# Patient Record
Sex: Female | Born: 1935 | Race: White | Hispanic: No | Marital: Married | Smoking: Current every day smoker
Health system: Southern US, Community
[De-identification: ages and names within clinical notes are randomized; demographics above are authoritative.]

## PROBLEM LIST (undated history)

## (undated) DIAGNOSIS — N289 Disorder of kidney and ureter, unspecified: Secondary | ICD-10-CM

## (undated) DIAGNOSIS — R238 Other skin changes: Secondary | ICD-10-CM

## (undated) DIAGNOSIS — J449 Chronic obstructive pulmonary disease, unspecified: Secondary | ICD-10-CM

## (undated) DIAGNOSIS — I7090 Unspecified atherosclerosis: Secondary | ICD-10-CM

## (undated) DIAGNOSIS — C3490 Malignant neoplasm of unspecified part of unspecified bronchus or lung: Secondary | ICD-10-CM

## (undated) DIAGNOSIS — S0990XA Unspecified injury of head, initial encounter: Secondary | ICD-10-CM

## (undated) DIAGNOSIS — R52 Pain, unspecified: Secondary | ICD-10-CM

## (undated) DIAGNOSIS — Z923 Personal history of irradiation: Secondary | ICD-10-CM

## (undated) DIAGNOSIS — R233 Spontaneous ecchymoses: Secondary | ICD-10-CM

## (undated) DIAGNOSIS — I10 Essential (primary) hypertension: Secondary | ICD-10-CM

## (undated) DIAGNOSIS — R0602 Shortness of breath: Secondary | ICD-10-CM

## (undated) DIAGNOSIS — M1990 Unspecified osteoarthritis, unspecified site: Secondary | ICD-10-CM

## (undated) HISTORY — PX: OTHER SURGICAL HISTORY: SHX169

## (undated) HISTORY — PX: KIDNEY STONE SURGERY: SHX686

## (undated) HISTORY — DX: Essential (primary) hypertension: I10

## (undated) HISTORY — DX: Personal history of irradiation: Z92.3

## (undated) HISTORY — PX: LUNG BIOPSY: SHX232

## (undated) HISTORY — DX: Chronic obstructive pulmonary disease, unspecified: J44.9

---

## 2003-02-03 ENCOUNTER — Encounter: Admission: RE | Admit: 2003-02-03 | Discharge: 2003-02-03 | Payer: Self-pay | Admitting: Internal Medicine

## 2003-02-03 ENCOUNTER — Encounter: Payer: Self-pay | Admitting: Internal Medicine

## 2003-02-09 ENCOUNTER — Encounter: Admission: RE | Admit: 2003-02-09 | Discharge: 2003-02-09 | Payer: Self-pay | Admitting: Internal Medicine

## 2003-02-09 ENCOUNTER — Encounter: Payer: Self-pay | Admitting: Internal Medicine

## 2007-09-29 ENCOUNTER — Inpatient Hospital Stay (HOSPITAL_COMMUNITY): Admission: EM | Admit: 2007-09-29 | Discharge: 2007-10-02 | Payer: Self-pay | Admitting: Emergency Medicine

## 2007-10-24 ENCOUNTER — Ambulatory Visit: Payer: Self-pay | Admitting: Cardiovascular Disease

## 2007-10-24 ENCOUNTER — Inpatient Hospital Stay (HOSPITAL_COMMUNITY): Admission: EM | Admit: 2007-10-24 | Discharge: 2007-10-27 | Payer: Self-pay | Admitting: Emergency Medicine

## 2007-10-30 ENCOUNTER — Ambulatory Visit (HOSPITAL_COMMUNITY): Admission: RE | Admit: 2007-10-30 | Discharge: 2007-10-30 | Payer: Self-pay | Admitting: *Deleted

## 2007-12-18 ENCOUNTER — Encounter (HOSPITAL_COMMUNITY): Admission: RE | Admit: 2007-12-18 | Discharge: 2008-02-04 | Payer: Self-pay | Admitting: Cardiology

## 2009-05-12 ENCOUNTER — Other Ambulatory Visit: Admission: RE | Admit: 2009-05-12 | Discharge: 2009-05-12 | Payer: Self-pay | Admitting: Internal Medicine

## 2009-08-23 ENCOUNTER — Encounter: Admission: RE | Admit: 2009-08-23 | Discharge: 2009-08-23 | Payer: Self-pay | Admitting: Internal Medicine

## 2010-01-13 ENCOUNTER — Encounter: Admission: RE | Admit: 2010-01-13 | Discharge: 2010-01-13 | Payer: Self-pay | Admitting: Internal Medicine

## 2010-06-24 ENCOUNTER — Encounter: Payer: Self-pay | Admitting: Internal Medicine

## 2010-06-25 ENCOUNTER — Encounter: Payer: Self-pay | Admitting: Internal Medicine

## 2010-06-26 ENCOUNTER — Encounter
Admission: RE | Admit: 2010-06-26 | Discharge: 2010-06-26 | Payer: Self-pay | Source: Home / Self Care | Attending: Internal Medicine | Admitting: Internal Medicine

## 2010-10-17 NOTE — Op Note (Signed)
[name], [name] NO.:  [id number]   MEDICAL RECORD NO.:  [id number]          PATIENT TYPE:  AMB   LOCATION:  ENDO                         FACILITY:  [facility]   PHYSICIAN:  Georgiana Spinner, M.D.    DATE OF BIRTH:  06-12-1934   DATE OF PROCEDURE:  DATE OF DISCHARGE:                               OPERATIVE REPORT   PROCEDURE:  Colonoscopy.   INDICATIONS:  Colon cancer screening, family history of colon cancer.   ANESTHESIA:  Fentanyl 75 mcg, Versed 7.5 mg.   DESCRIPTION OF PROCEDURE:  With the patient mildly sedated in the left  lateral decubitus position, the pediatric colonoscope was inserted in  the rectum and passed under direct vision to the cecum, identified by  ileocecal valve and appendiceal orifice.  From this point, the  colonoscope was slowly withdrawn taking circumferential views of colonic  mucosa, stopping only in the rectum which appeared normal on direct,  showed hemorrhoids on retroflexed view.  The endoscope was straightened  and withdrawn.  The patient's vital signs and pulse oximeter remained  stable.  The patient tolerated the procedure well without apparent  complication.   FINDINGS:  Internal hemorrhoids, otherwise an unremarkable exam.   PLAN:  Consider repeat examination in 5 years.           ______________________________  Georgiana Spinner, M.D.     GMO/MEDQ  D:  10/30/2007  T:  10/30/2007  Job:  [id number]
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
## 2010-10-18 ENCOUNTER — Other Ambulatory Visit: Payer: Self-pay | Admitting: Internal Medicine

## 2010-10-18 DIAGNOSIS — Z1231 Encounter for screening mammogram for malignant neoplasm of breast: Secondary | ICD-10-CM

## 2010-10-20 NOTE — Discharge Summary (Signed)
[name], [name] NO.:  [id number]   MEDICAL RECORD NO.:  [id number]          PATIENT TYPE:  INP   LOCATION:  3039                         FACILITY:  [facility]   PHYSICIAN:  Lonia Blood, M.D.       DATE OF BIRTH:  06/26/1934   DATE OF ADMISSION:  09/29/2007  DATE OF DISCHARGE:  10/02/2007                               DISCHARGE SUMMARY   PRIMARY CARE PHYSICIAN:  Will become Massie Maroon, M.D. with [facility].   DISCHARGE DIAGNOSES:  1. Chronic obstructive pulmonary disease, exacerbation.  2. Status post hysterectomy.  3. Status post surgical removal of a kidney stone.  4. Status post bilateral cataract surgery.   DISCHARGE MEDICATIONS:  1. Avelox 400 mg daily.  2. Prednisone tapered.  3. Ventolin 1 puff three times a day.  4. Spiriva 1 puff daily.   CONDITION ON DISCHARGE:  [name] is discharged in good condition.  She is to follow up with Dr. Pearson Grippe from [facility] as the patient's new primary care physician.   PROCEDURE:  During this admission on September 29, 2007, the patient  underwent a portable chest x-ray PA and lateral, findings of COPD and  left mid lung pneumonia.   CONSULTATION:  No consultations.   HISTORY AND PHYSICAL:  Refer to dictated H&P which was done by Dr. Darnelle Catalan  on September 29, 2007.   HOSPITAL COURSE:  Problem #1.  COPD, exacerbation with probable  pneumonia.  [name] was admitted to [facility] [address]  She was  placed on intravenous antibiotics, intravenous steroids, and  bronchodilators.  The patient had a course of progressing recovery and  she was switched to oral steroids and oral antibiotics without  exacerbation of her symptoms.  The patient was tested and she did not  require home oxygen therapy.  [name] was instructed thoroughly about  the importance of complete tobacco cessation and a careful followup with  her new primary care physician.     Lonia Blood, M.D.  Electronically Signed    SL/MEDQ  D:  10/07/2007  T:  10/08/2007  Job:  [id number]   cc:   Massie Maroon, MD

## 2010-10-24 ENCOUNTER — Ambulatory Visit: Payer: Self-pay

## 2011-02-27 LAB — BLOOD GAS, ARTERIAL
Acid-Base Excess: 3.3 — ABNORMAL HIGH
Bicarbonate: 27.4 — ABNORMAL HIGH
Drawn by: 245131
FIO2: 0.21
O2 Saturation: 87.3
Patient temperature: 98.6
pO2, Arterial: 52.3 — ABNORMAL LOW

## 2011-02-27 LAB — DIFFERENTIAL
Basophils Absolute: 0
Eosinophils Absolute: 0.1
Eosinophils Relative: 2
Lymphocytes Relative: 15
Monocytes Absolute: 1

## 2011-02-27 LAB — COMPREHENSIVE METABOLIC PANEL
ALT: 13
AST: 16
Albumin: 3.1 — ABNORMAL LOW
CO2: 28
Chloride: 100
Creatinine, Ser: 0.62
GFR calc Af Amer: >60
GFR calc non Af Amer: >60
Potassium: 3.6
Sodium: 138
Total Bilirubin: 0.4

## 2011-02-27 LAB — CBC
Platelets: 338
RBC: 3.83 — ABNORMAL LOW
WBC: 5.2

## 2011-02-28 LAB — CARDIAC PANEL(CRET KIN+CKTOT+MB+TROPI)
CK, MB: 1.7
Relative Index: INVALID
Troponin I: 0.04
Troponin I: 0.04

## 2011-02-28 LAB — DIFFERENTIAL
Basophils Absolute: 0
Basophils Relative: 0
Eosinophils Absolute: 0
Eosinophils Relative: 0
Monocytes Absolute: 0.5
Monocytes Relative: 5

## 2011-02-28 LAB — CBC
Hemoglobin: 11.5 — ABNORMAL LOW
MCHC: 33.6
MCV: 88.9
RBC: 3.85 — ABNORMAL LOW
RDW: 14.5

## 2011-02-28 LAB — BASIC METABOLIC PANEL
CO2: 27
Calcium: 8.7
GFR calc Af Amer: >60
GFR calc non Af Amer: >60
Sodium: 136

## 2011-02-28 LAB — POCT I-STAT, CHEM 8
BUN: 11
Chloride: 96
Creatinine, Ser: 0.8
Hemoglobin: 11.9 — ABNORMAL LOW
Potassium: 4
Sodium: 129 — ABNORMAL LOW

## 2011-02-28 LAB — LIPID PANEL
Cholesterol: 125
HDL: 23 — ABNORMAL LOW
LDL Cholesterol: 91

## 2011-02-28 LAB — POCT CARDIAC MARKERS
CKMB, poc: 2.3
Myoglobin, poc: 87
Troponin i, poc: <0.05

## 2011-02-28 LAB — D-DIMER, QUANTITATIVE: D-Dimer, Quant: 0.59 — ABNORMAL HIGH

## 2012-07-08 ENCOUNTER — Other Ambulatory Visit: Payer: Self-pay | Admitting: Internal Medicine

## 2012-07-08 DIAGNOSIS — Z1231 Encounter for screening mammogram for malignant neoplasm of breast: Secondary | ICD-10-CM

## 2012-08-12 ENCOUNTER — Ambulatory Visit
Admission: RE | Admit: 2012-08-12 | Discharge: 2012-08-12 | Disposition: A | Discharge status: Home / Self Care | Payer: Medicare Other | Source: Ambulatory Visit | Attending: Internal Medicine | Admitting: Internal Medicine

## 2012-08-12 DIAGNOSIS — Z1231 Encounter for screening mammogram for malignant neoplasm of breast: Secondary | ICD-10-CM

## 2012-08-14 ENCOUNTER — Other Ambulatory Visit: Payer: Self-pay | Admitting: Internal Medicine

## 2012-08-25 ENCOUNTER — Ambulatory Visit
Admission: RE | Admit: 2012-08-25 | Discharge: 2012-08-25 | Disposition: A | Discharge status: Home / Self Care | Payer: Medicare Other | Source: Ambulatory Visit | Attending: Internal Medicine | Admitting: Internal Medicine

## 2012-08-25 DIAGNOSIS — R928 Other abnormal and inconclusive findings on diagnostic imaging of breast: Secondary | ICD-10-CM

## 2012-12-02 ENCOUNTER — Other Ambulatory Visit: Payer: Self-pay | Admitting: Internal Medicine

## 2012-12-02 DIAGNOSIS — R918 Other nonspecific abnormal finding of lung field: Secondary | ICD-10-CM

## 2012-12-03 ENCOUNTER — Ambulatory Visit
Admission: RE | Admit: 2012-12-03 | Discharge: 2012-12-03 | Disposition: A | Discharge status: Home / Self Care | Payer: Medicare Other | Source: Ambulatory Visit | Attending: Internal Medicine | Admitting: Internal Medicine

## 2012-12-03 DIAGNOSIS — R918 Other nonspecific abnormal finding of lung field: Secondary | ICD-10-CM

## 2012-12-03 MED ORDER — IOHEXOL 300 MG/ML  SOLN
50.0000 mL | Freq: Once | INTRAMUSCULAR | Status: AC | PRN
  Administered 2012-12-03: 50 mL via INTRAVENOUS

## 2012-12-08 ENCOUNTER — Telehealth: Payer: Self-pay | Admitting: Internal Medicine

## 2012-12-08 ENCOUNTER — Telehealth: Payer: Self-pay | Admitting: *Deleted

## 2012-12-08 NOTE — Telephone Encounter (Signed)
Spoke with pt regarding appt with Dr. Arbutus Ped 12/09/12 at 4:00 and labs at 3:30.  She verbalized understanding of time and place of appt

## 2012-12-08 NOTE — Telephone Encounter (Signed)
PT SCHEDULED TO SEE DR. MOHAMED 07/08 @ 3:30.

## 2012-12-09 ENCOUNTER — Other Ambulatory Visit: Payer: Medicare Other | Admitting: Lab

## 2012-12-09 ENCOUNTER — Ambulatory Visit: Payer: Medicare Other | Admitting: Internal Medicine

## 2012-12-09 ENCOUNTER — Ambulatory Visit: Payer: Medicare Other

## 2012-12-12 ENCOUNTER — Ambulatory Visit (HOSPITAL_BASED_OUTPATIENT_CLINIC_OR_DEPARTMENT_OTHER): Payer: Medicare Other | Admitting: Lab

## 2012-12-12 ENCOUNTER — Telehealth: Payer: Self-pay | Admitting: *Deleted

## 2012-12-12 ENCOUNTER — Ambulatory Visit (HOSPITAL_BASED_OUTPATIENT_CLINIC_OR_DEPARTMENT_OTHER): Payer: Medicare Other | Admitting: Internal Medicine

## 2012-12-12 ENCOUNTER — Other Ambulatory Visit: Payer: Medicare Other | Admitting: Lab

## 2012-12-12 ENCOUNTER — Encounter: Payer: Self-pay | Admitting: *Deleted

## 2012-12-12 ENCOUNTER — Encounter: Payer: Self-pay | Admitting: Internal Medicine

## 2012-12-12 ENCOUNTER — Ambulatory Visit: Payer: Medicare Other

## 2012-12-12 VITALS — BP 139/66 | HR 89 | Temp 97.7°F | Resp 18 | Wt 93.0 lb

## 2012-12-12 DIAGNOSIS — R0602 Shortness of breath: Secondary | ICD-10-CM

## 2012-12-12 DIAGNOSIS — R918 Other nonspecific abnormal finding of lung field: Secondary | ICD-10-CM | POA: Insufficient documentation

## 2012-12-12 DIAGNOSIS — F17200 Nicotine dependence, unspecified, uncomplicated: Secondary | ICD-10-CM

## 2012-12-12 DIAGNOSIS — R222 Localized swelling, mass and lump, trunk: Secondary | ICD-10-CM

## 2012-12-12 DIAGNOSIS — J449 Chronic obstructive pulmonary disease, unspecified: Secondary | ICD-10-CM

## 2012-12-12 DIAGNOSIS — I10 Essential (primary) hypertension: Secondary | ICD-10-CM

## 2012-12-12 DIAGNOSIS — R599 Enlarged lymph nodes, unspecified: Secondary | ICD-10-CM

## 2012-12-12 HISTORY — DX: Chronic obstructive pulmonary disease, unspecified: J44.9

## 2012-12-12 HISTORY — DX: Essential (primary) hypertension: I10

## 2012-12-12 LAB — CBC WITH DIFFERENTIAL/PLATELET
Basophils Absolute: 0 10*3/uL (ref 0.0–0.1)
Eosinophils Absolute: 0.3 10*3/uL (ref 0.0–0.5)
HGB: 13.6 g/dL (ref 11.6–15.9)
LYMPH%: 15.5 % (ref 14.0–49.7)
MCV: 92.7 fL (ref 79.5–101.0)
MONO%: 6.5 % (ref 0.0–14.0)
NEUT#: 7.9 10*3/uL — ABNORMAL HIGH (ref 1.5–6.5)
Platelets: 511 10*3/uL — ABNORMAL HIGH (ref 145–400)

## 2012-12-12 LAB — COMPREHENSIVE METABOLIC PANEL (CC13)
Albumin: 3.6 g/dL (ref 3.5–5.0)
Alkaline Phosphatase: 82 U/L (ref 40–150)
BUN: 6.8 mg/dL — ABNORMAL LOW (ref 7.0–26.0)
Glucose: 101 mg/dl (ref 70–140)
Total Bilirubin: 0.35 mg/dL (ref 0.20–1.20)

## 2012-12-12 NOTE — Patient Instructions (Signed)
You have suspicious lung cancer. I will order several studies for further evaluation of her disease including PET scan as well as CT scan of the head and CT guided core biopsy of the left lower lobe lung mass. Followup visit in 2 weeks.

## 2012-12-12 NOTE — Progress Notes (Signed)
Patient screened by Distress Thermometer.  Patient scored a 2 on thermometer, no referrals made at this time. 
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
## 2012-12-12 NOTE — Telephone Encounter (Signed)
appts made and printed. Pt family member refused her appt for 12/25/12. [facility]v appt d/t for 12/31/12 @ her request. Pt is also aware that cs will call them w/ appts for CT biopsy, HEAD, AND PET.Marland KitchenMarland KitchenTD

## 2012-12-15 ENCOUNTER — Telehealth: Payer: Self-pay | Admitting: *Deleted

## 2012-12-15 NOTE — Telephone Encounter (Signed)
When pt was in the office last week we discussed options for smoking cessation.  I called today to follow up.  She stated she would like to try e-cigarettes.  I encourage here to look for alternative to smoking and reviewed this with her.

## 2012-12-16 ENCOUNTER — Encounter (HOSPITAL_COMMUNITY): Payer: Self-pay | Admitting: Pharmacy Technician

## 2012-12-18 ENCOUNTER — Other Ambulatory Visit: Payer: Self-pay | Admitting: Radiology

## 2012-12-19 ENCOUNTER — Other Ambulatory Visit: Payer: Self-pay | Admitting: Radiology

## 2012-12-19 ENCOUNTER — Other Ambulatory Visit: Payer: Self-pay | Admitting: *Deleted

## 2012-12-19 ENCOUNTER — Encounter (HOSPITAL_COMMUNITY)
Admission: RE | Admit: 2012-12-19 | Discharge: 2012-12-19 | Disposition: A | Discharge status: Home / Self Care | Payer: Medicare Other | Source: Ambulatory Visit | Attending: Internal Medicine | Admitting: Internal Medicine

## 2012-12-19 ENCOUNTER — Ambulatory Visit (HOSPITAL_COMMUNITY)
Admission: RE | Admit: 2012-12-19 | Discharge: 2012-12-19 | Disposition: A | Discharge status: Home / Self Care | Payer: Medicare Other | Source: Ambulatory Visit | Attending: Internal Medicine | Admitting: Internal Medicine

## 2012-12-19 DIAGNOSIS — J438 Other emphysema: Secondary | ICD-10-CM | POA: Insufficient documentation

## 2012-12-19 DIAGNOSIS — N1330 Unspecified hydronephrosis: Secondary | ICD-10-CM | POA: Insufficient documentation

## 2012-12-19 DIAGNOSIS — R918 Other nonspecific abnormal finding of lung field: Secondary | ICD-10-CM

## 2012-12-19 DIAGNOSIS — R222 Localized swelling, mass and lump, trunk: Secondary | ICD-10-CM | POA: Insufficient documentation

## 2012-12-19 MED ORDER — PREDNISONE 50 MG PO TABS: ORAL_TABLET | ORAL | Status: DC

## 2012-12-19 MED ORDER — FLUDEOXYGLUCOSE F - 18 (FDG) INJECTION: 17.4000 | Freq: Once | INTRAVENOUS | Status: DC | PRN

## 2012-12-19 MED ORDER — DIPHENHYDRAMINE HCL 50 MG PO TABS: 50.0000 mg | ORAL_TABLET | ORAL | Status: DC

## 2012-12-19 NOTE — Progress Notes (Signed)
Pt has contrast allergy, per dr Donnald Garre, okay to premedicate pt with CT premed protocol of benadryl and prednisone.  SLJ

## 2012-12-22 ENCOUNTER — Encounter (HOSPITAL_COMMUNITY): Payer: Self-pay

## 2012-12-22 ENCOUNTER — Ambulatory Visit (HOSPITAL_COMMUNITY)
Admission: RE | Admit: 2012-12-22 | Discharge: 2012-12-22 | Disposition: A | Discharge status: Home / Self Care | Payer: Medicare Other | Source: Ambulatory Visit | Attending: Interventional Radiology | Admitting: Interventional Radiology

## 2012-12-22 ENCOUNTER — Ambulatory Visit (HOSPITAL_COMMUNITY)
Admission: RE | Admit: 2012-12-22 | Discharge: 2012-12-22 | Disposition: A | Discharge status: Home / Self Care | Payer: Medicare Other | Source: Ambulatory Visit | Attending: Internal Medicine | Admitting: Internal Medicine

## 2012-12-22 ENCOUNTER — Other Ambulatory Visit (HOSPITAL_COMMUNITY): Payer: Medicare Other

## 2012-12-22 DIAGNOSIS — F17200 Nicotine dependence, unspecified, uncomplicated: Secondary | ICD-10-CM | POA: Insufficient documentation

## 2012-12-22 DIAGNOSIS — R918 Other nonspecific abnormal finding of lung field: Secondary | ICD-10-CM

## 2012-12-22 DIAGNOSIS — J4489 Other specified chronic obstructive pulmonary disease: Secondary | ICD-10-CM | POA: Insufficient documentation

## 2012-12-22 DIAGNOSIS — I10 Essential (primary) hypertension: Secondary | ICD-10-CM | POA: Insufficient documentation

## 2012-12-22 DIAGNOSIS — C3430 Malignant neoplasm of lower lobe, unspecified bronchus or lung: Secondary | ICD-10-CM | POA: Insufficient documentation

## 2012-12-22 DIAGNOSIS — J449 Chronic obstructive pulmonary disease, unspecified: Secondary | ICD-10-CM | POA: Insufficient documentation

## 2012-12-22 LAB — PROTIME-INR: INR: 0.9 (ref 0.00–1.49)

## 2012-12-22 LAB — CBC
HCT: 39.7 % (ref 36.0–46.0)
Hemoglobin: 12.8 g/dL (ref 12.0–15.0)
RDW: 16.1 % — ABNORMAL HIGH (ref 11.5–15.5)
WBC: 7.2 10*3/uL (ref 4.0–10.5)

## 2012-12-22 LAB — APTT: aPTT: 29 seconds (ref 24–37)

## 2012-12-22 MED ORDER — MIDAZOLAM HCL 2 MG/2ML IJ SOLN
INTRAMUSCULAR | Status: AC
  Filled 2012-12-22: qty 2

## 2012-12-22 MED ORDER — SODIUM CHLORIDE 0.9 % IV SOLN: Freq: Once | INTRAVENOUS | Status: DC

## 2012-12-22 MED ORDER — FENTANYL CITRATE 0.05 MG/ML IJ SOLN
INTRAMUSCULAR | Status: AC
  Filled 2012-12-22: qty 2

## 2012-12-22 MED ORDER — FENTANYL CITRATE 0.05 MG/ML IJ SOLN
INTRAMUSCULAR | Status: AC | PRN
  Administered 2012-12-22 (×2): 25 ug via INTRAVENOUS

## 2012-12-22 MED ORDER — MIDAZOLAM HCL 2 MG/2ML IJ SOLN
INTRAMUSCULAR | Status: AC | PRN
  Administered 2012-12-22: 1 mg via INTRAVENOUS

## 2012-12-22 NOTE — Progress Notes (Addendum)
Patient has no new complaints of shortness of breath. Patient's breathing status is baseline per patient. Patient's oxygen saturation is decreased from arrival to Short Stay. Dr. Kirt Boys is notified of patients condition. Patient is stable at discharge.

## 2012-12-22 NOTE — H&P (Signed)
Chief Complaint: "I'm here for a lung biopsy" Referring Physician:Mohamed HPI: [name] is an 78 y.o. female with newly found left lower lung mass. She has had PET scan and is now referred for biopsy. PMHx and meds reviewed. Pt feels well, no recent fevers or illness.  Past Medical History:  Past Medical History  Diagnosis Date  . COPD (chronic obstructive pulmonary disease) 12/12/2012  . HTN (hypertension) 12/12/2012    Past Surgical History: History reviewed. No pertinent past surgical history.  Family History: History reviewed. No pertinent family history.  Social History:  reports that she has been smoking Cigarettes.  She has a 15 pack-year smoking history. She does not have any smokeless tobacco history on file. She reports that she does not use illicit drugs. Her alcohol history is not on file.  Allergies:  Allergies  Allergen Reactions  . Iohexol Rash    Broke out in a rash after an iv med at [facility] center.    Medications:   Medication List    ASK your doctor about these medications       diltiazem 120 MG 24 hr capsule  Commonly known as:  TIAZAC  Take 120 mg by mouth every morning.     diphenhydrAMINE 50 MG tablet  Commonly known as:  BENADRYL  Take 1 tablet (50 mg total) by mouth as directed. Take 50mg  1 hour prior to CT scan     OVER THE COUNTER MEDICATION  Take 1 capsule by mouth daily. Calcium carbonate 333 mg, magnesium oxide 133 mg and zinc sulfate 5 mg     predniSONE 50 MG tablet  Commonly known as:  DELTASONE  Take 50mg  13 hours, 7 hours, and 1 hour before CT Scan        Please HPI for pertinent positives, otherwise complete 10 system ROS negative.  Physical Exam: BP 138/57  Pulse 72  Temp(Src) 97.5 F (36.4 C) (Oral)  Resp 18  SpO2 95% There is no height or weight on file to calculate BMI.   General Appearance:  Alert, cooperative, no distress, appears stated age  Head:  Normocephalic, without obvious abnormality, atraumatic  ENT:  Unremarkable  Neck: Supple, symmetrical, trachea midline  Lungs:   Clear to auscultation bilaterally, no w/r/r.  Chest Wall:  No tenderness or deformity  Heart:  Regular rate and rhythm, S1, S2 normal, no murmur, rub or gallop.  Neurologic: Normal affect, no gross deficits.   Results for orders placed during the hospital encounter of 12/22/12 (from the past 48 hour(s))  APTT     Status: None   Collection Time    12/22/12  7:45 AM      Result Value Range   aPTT 29  24 - 37 seconds  CBC     Status: Abnormal   Collection Time    12/22/12  7:45 AM      Result Value Range   WBC 7.2  4.0 - 10.5 K/uL   RBC 4.24  3.87 - 5.11 MIL/uL   Hemoglobin 12.8  12.0 - 15.0 g/dL   HCT 45.4  09.8 - 11.9 %   MCV 93.6  78.0 - 100.0 fL   MCH 30.2  26.0 - 34.0 pg   MCHC 32.2  30.0 - 36.0 g/dL   RDW 14.7 (*) 82.9 - 56.2 %   Platelets 368  150 - 400 K/uL  PROTIME-INR     Status: None   Collection Time    12/22/12  7:45 AM  Result Value Range   Prothrombin Time 12.0  11.6 - 15.2 seconds   INR 0.90  0.00 - 1.49   No results found.  Assessment/Plan Left lung mass Discussed CT guided lung mass biopsy Procedure, risks, complications explained in detail. Labs reviewed. Consent signed in chart  Brayton El PA-C 12/22/2012, 8:18 AM

## 2012-12-22 NOTE — Procedures (Signed)
Technically successful CT guided biopsy of hypermetabolic left lower lobe pulmonary mass.  No immediate post procedural complications.

## 2012-12-24 ENCOUNTER — Telehealth: Payer: Self-pay | Admitting: Medical Oncology

## 2012-12-24 NOTE — Telephone Encounter (Signed)
Requests results of bx.

## 2012-12-25 ENCOUNTER — Telehealth: Payer: Self-pay | Admitting: Medical Oncology

## 2012-12-25 NOTE — Telephone Encounter (Signed)
Pt.notified

## 2012-12-25 NOTE — Telephone Encounter (Signed)
Message copied by Charma Igo on Thu Dec 25, 2012 12:06 PM ------      Message from: Si Gaul      Created: Wed Dec 24, 2012  9:30 PM       You can tell her it is cancer. Will discuss in more details at the appt.      ----- Message -----         From: Charma Igo, RN         Sent: 12/24/2012  11:20 AM           To: Si Gaul, MD            Wants results of bx -next appt is Ct on 7/25 and f/u 7/30       ------

## 2012-12-26 ENCOUNTER — Ambulatory Visit (HOSPITAL_COMMUNITY)
Admission: RE | Admit: 2012-12-26 | Discharge: 2012-12-26 | Disposition: A | Discharge status: Home / Self Care | Payer: Medicare Other | Source: Ambulatory Visit | Attending: Internal Medicine | Admitting: Internal Medicine

## 2012-12-26 ENCOUNTER — Other Ambulatory Visit: Payer: Self-pay | Admitting: Internal Medicine

## 2012-12-26 DIAGNOSIS — R918 Other nonspecific abnormal finding of lung field: Secondary | ICD-10-CM

## 2012-12-26 DIAGNOSIS — C3430 Malignant neoplasm of lower lobe, unspecified bronchus or lung: Secondary | ICD-10-CM | POA: Insufficient documentation

## 2012-12-26 MED ORDER — IOHEXOL 300 MG/ML  SOLN
80.0000 mL | Freq: Once | INTRAMUSCULAR | Status: AC | PRN
  Administered 2012-12-26: 80 mL via INTRAVENOUS

## 2012-12-31 ENCOUNTER — Telehealth: Payer: Self-pay | Admitting: *Deleted

## 2012-12-31 ENCOUNTER — Ambulatory Visit (HOSPITAL_BASED_OUTPATIENT_CLINIC_OR_DEPARTMENT_OTHER): Payer: Medicare Other | Admitting: Internal Medicine

## 2012-12-31 ENCOUNTER — Encounter: Payer: Self-pay | Admitting: Internal Medicine

## 2012-12-31 ENCOUNTER — Other Ambulatory Visit: Payer: Medicare Other | Admitting: Lab

## 2012-12-31 ENCOUNTER — Encounter: Payer: Self-pay | Admitting: *Deleted

## 2012-12-31 DIAGNOSIS — C3430 Malignant neoplasm of lower lobe, unspecified bronchus or lung: Secondary | ICD-10-CM | POA: Insufficient documentation

## 2012-12-31 NOTE — Telephone Encounter (Signed)
Left message for an appt at [facility] 01/01/13 at 2:30

## 2012-12-31 NOTE — Patient Instructions (Signed)
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 I will refer you to cardiothoracic surgery for evaluation and consideration of surgical resection. Follow up visit in one month

## 2012-12-31 NOTE — Progress Notes (Signed)
[facility] [phone]   [phone]  OFFICE PROGRESS NOTE  Pearson Grippe, MD [address] [place] [address]  DIAGNOSIS: Stage IIB (T2b., N1, M0) non-small cell lung cancer, invasive squamous cell carcinoma diagnosed in July of 2014.  PRIOR THERAPY: None  CURRENT THERAPY: None  INTERVAL HISTORY: [name] 78 y.o. female returns to the clinic today for follow up visit accompanied by her daughter. The patient is feeling fine today with no specific complaints. She denied having any significant chest pain but continues to have shortness breath with exertion, no cough or hemoptysis. The patient denied having any significant weight loss or night sweats.she had a recent PET scan as well as CT of the head and CT-guided biopsy of the left upper lobe lung mass and she is here today for evaluation and discussion of her scan and biopsy results.  MEDICAL HISTORY: Past Medical History  Diagnosis Date  . COPD (chronic obstructive pulmonary disease) 12/12/2012  . HTN (hypertension) 12/12/2012    ALLERGIES:  is allergic to iohexol.  MEDICATIONS:  Current Outpatient Prescriptions  Medication Sig Dispense Refill  . COMBIVENT RESPIMAT 20-100 MCG/ACT AERS respimat Inhale into the lungs 4 (four) times daily as needed.      . diltiazem (TIAZAC) 120 MG 24 hr capsule Take 120 mg by mouth every morning.       [name] OVER THE COUNTER MEDICATION Take 1 capsule by mouth daily. Calcium carbonate 333 mg, magnesium oxide 133 mg and zinc sulfate 5 mg       No current facility-administered medications for this visit.    REVIEW OF SYSTEMS:  A comprehensive review of systems was negative except for: Constitutional: positive for fatigue Respiratory: positive for dyspnea on exertion   PHYSICAL EXAMINATION: General appearance: alert, cooperative and no distress Head: Normocephalic, without obvious abnormality, atraumatic Neck: no adenopathy Lymph nodes:  Cervical, supraclavicular, and axillary nodes normal. Resp: clear to auscultation bilaterally Cardio: regular rate and rhythm, S1, S2 normal, no murmur, click, rub or gallop GI: soft, non-tender; bowel sounds normal; no masses,  no organomegaly Extremities: extremities normal, atraumatic, no cyanosis or edema Neurologic: Alert and oriented X 3, normal strength and tone. Normal symmetric reflexes. Normal coordination and gait  ECOG PERFORMANCE STATUS: 1 - Symptomatic but completely ambulatory  Blood pressure 140/57, pulse 83, temperature 97.5 F (36.4 C), temperature source Oral, resp. rate 20, height 5\' 3"  (1.6 m), weight 93 lb 3.2 oz (42.275 kg).  LABORATORY DATA: Lab Results  Component Value Date   WBC 7.2 12/22/2012   HGB 12.8 12/22/2012   HCT 39.7 12/22/2012   MCV 93.6 12/22/2012   PLT 368 12/22/2012      Chemistry      Component Value Date/Time   NA 139 12/12/2012 1032   NA 136 DELTA CHECK NOTED 10/25/2007 1050   K 4.1 12/12/2012 1032   K 4.2 10/25/2007 1050   CL 102 10/25/2007 1050   CO2 30* 12/12/2012 1032   CO2 27 10/25/2007 1050   BUN 6.8* 12/12/2012 1032   BUN 11 10/25/2007 1050   CREATININE 0.6 12/12/2012 1032   CREATININE 0.46 DELTA CHECK NOTED 10/25/2007 1050      Component Value Date/Time   CALCIUM 9.7 12/12/2012 1032   CALCIUM 8.7 10/25/2007 1050   ALKPHOS 82 12/12/2012 1032   ALKPHOS 69 09/29/2007 2140   AST 13 12/12/2012 1032   AST 16 09/29/2007 2140   ALT 10 12/12/2012 1032  ALT 13 09/29/2007 2140   BILITOT 0.35 12/12/2012 1032   BILITOT 0.4 09/29/2007 2140       RADIOGRAPHIC STUDIES: Dg Chest 1 View  12/22/2012   *RADIOLOGY REPORT*  Clinical Data: Post biopsy of left lower lobe hypermetabolic pulmonary mass  CHEST - 1 VIEW  Comparison: Earlier same day; CT guided biopsy of hypermetabolic left lower lobe pulmonary nodule - earlier same day; PET CT - 12/19/2012  Findings:  Grossly unchanged cardiac silhouette and mediastinal contours.  The lungs remain hyperexpanded with  diffuse thickening of the pulmonary interstitium.  Grossly unchanged appearance of known hypermetabolic pulmonary mass within the left lower lobe.  Grossly unchanged asymmetric biapical pleural parenchymal thickening, left greater than right.  No definite pneumothorax. There is persistent blunting of bilateral costophrenic angles without definite pleural effusion. Unchanged bones.  IMPRESSION: No evidence of pneumothorax or significant hemorrhage post CT guided left lower lobe pulmonary mass biopsy.   Original Report Authenticated By: Tacey Ruiz, MD   Dg Chest 1 View  12/22/2012   *RADIOLOGY REPORT*  Clinical Data: Status post left lower lobe biopsy.  CHEST - 1 VIEW  Comparison: Chest x-ray 12/01/2012.  Findings: As with the prior examination there is a large mass projecting over the lower left hemithorax which is similar in appearance.  No surrounding airspace disease in the adjacent lung parenchyma to suggest significant postprocedural hemorrhage.  No left-sided pneumothorax.  Mild emphysematous changes are noted throughout the lungs bilaterally.  No definite pleural effusion. No evidence of pulmonary edema.  Heart size is normal.  Upper mediastinal contours are within normal limits.  IMPRESSION: 1.  No evidence of pneumothorax or significant postprocedural hemorrhage in this patient status post left lower lobe mass biopsy.   Original Report Authenticated By: Trudie Reed, M.D.   Ct Head W Wo Contrast  12/26/2012   *RADIOLOGY REPORT*  Clinical Data: 78 year old female with new diagnosis of lung cancer.  Staging.  CT HEAD WITHOUT AND WITH CONTRAST  Technique:  Contiguous axial images were obtained from the base of the skull through the vertex without and with intravenous contrast.  Contrast: 80mL OMNIPAQUE IOHEXOL 300 MG/ML  SOLN  Comparison: PET CT 12/19/2012.  Findings: Visualized paranasal sinuses and mastoids are clear.  No acute or suspicious osseous lesion of the skull identified. Visualized scalp  soft tissues are within normal limits. Postoperative changes to the globes.  Stable small volume extra-axial fluid in the left hemisphere and posterior fossa, most resembles a CSF hygroma.  Subsequent rightward midline shift of 4 mm, but otherwise no significant mass effect on the brain.  Basilar cisterns are patent. No acute intracranial hemorrhage identified.  No ventriculomegaly. Gray-white matter differentiation is within normal limits throughout the brain.  No abnormal enhancement identified.  Major intracranial vascular structures appear to be enhancing.  Dominant and somewhat dolichoectatic distal left vertebral artery. No evidence of cortically based acute infarction identified.  No intracranial metastasis identified.  IMPRESSION: 1.  No metastatic disease identified. 2.  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  Mild associated rightward midline shift, likely inconsequential. Probably these findings are chronic. 2.  Otherwise negative for age CT appearance of the brain.   Original Report Authenticated By: Erskine Speed, M.D.   Ct Chest W Contrast  12/03/2012   *RADIOLOGY REPORT*  Clinical Data: Lung mass noted on chest x-ray.  BUN and creatinine were obtained on site at [facility] at [address] Results:  BUN 0 mg/dL,  Creatinine 0.5 mg/dL.  CT CHEST WITH CONTRAST  Technique:  Multidetector CT imaging of the chest was performed following the standard protocol during bolus administration of intravenous contrast.  Contrast: 50mL OMNIPAQUE IOHEXOL 300 MG/ML  SOLN  Comparison: 12/01/2012 and 01/13/2010.  Findings: There is a pleural-based mass located within the left lower lobe posteriorly measuring 5.8 x 3.0 x 5.4 cm in size.  This is worrisome for carcinoma of the lung.  There are emphysematous changes present.  There are scarring changes noted within the lingula.  In addition, there is patchy air space disease seen anteriorly within the right middle lobe. There are no additional nodules.  The  central airways are patent.  There is a 1.7 cm precarinal mediastinal lymph node present (image #22).  There are no additional enlarged lymph nodes.  There are no pleural effusions.  The adrenal glands have normal appearance.  There is a small (7 mm) stable right lobe hepatic cyst.  IMPRESSION:  1.  5.8 cm pleural based mass located within the left lower lobe worrisome for carcinoma of the lung. 2.  1.7 cm precarinal mediastinal lymph node. 3.  Emphysematous changes.   Original Report Authenticated By: Rolla Plate, M.D.   Nm Pet Image Initial (pi) Skull Base To Thigh  12/19/2012   *RADIOLOGY REPORT*  Clinical Data: Initial treatment strategy for lung mass.  NUCLEAR MEDICINE PET SKULL BASE TO THIGH  Fasting Blood Glucose:  97  Technique:  17.4 mCi F-18 FDG was injected intravenously. CT data was obtained and used for attenuation correction and anatomic localization only.  (This was not acquired as a diagnostic CT examination.) Additional exam technical data entered on technologist worksheet.  Comparison:  CT 12/03/2012  Findings:  Neck: There is a focus of focal radiotracer uptake left lateral to the C3 lamina which is felt to represent benign musculoskeletal uptake.  Chest:  Within the left lower lobe there is a 5.5 x 3.6 cm intensely hypermetabolic mass with SUV max = 14.8. This mass abuts the pleural surfaces over a broad segment.  No  clear evidence of rib involvement.  There is some misregistration of the imaging.  There is no hypermetabolic mediastinal or hilar lymph nodes.  There is extensive central lobular emphysema present.  There is some linear nodular thickening in the right upper lobe and right lower lobe which is likely post inflammatory or  infectious.  Abdomen/Pelvis:  No abnormal hypermetabolic activity within the liver, pancreas, adrenal glands, or spleen.  No hypermetabolic lymph nodes in the abdomen or pelvis.  Chronic hydronephrosis of the right kidney which is malpositioned.  Skeleton:   No focal hypermetabolic activity to suggest skeletal metastasis.  IMPRESSION:  1.  Large hypermetabolic mass in the left lower lobe abutting the pleural surfaces most consistent bronchogenic carcinoma. 2.  No evidence of chest wall invasion. 3.  No evidence of mediastinal metastasis. 4.  Nodularity in the right upper lobe and right lower lobe is likely post infectious or inflammatory.   Original Report Authenticated By: Genevive Bi, M.D.   Ct Biopsy  12/22/2012   *RADIOLOGY REPORT*  Indication: History COPD, now with hypermetabolic left lower lobe pulmonary mass  CT GUIDED LEFT LOWER LOBE CORE NEEDLE BIOPSY  Comparisons: PET CT - 12/19/2012; chest CT - 12/04/2038  Intravenous medications: Fentanyl 50 mcg IV; Versed 1 mg IV  Contrast: None  Sedation time: 15 minutes  Complications: None immediate - the patient's oxygen saturation was noted to be low (high 70s to low 80s) throughout the procedure as well  as during that extended observation period of 4 hours however the patient was not subjectively short of breath and thus her decreased O2 saturation was attributable to the patient's baseline COPD.  TECHNIQUE/FINDINGS:  Informed consent was obtained from the patient following an explanation of the procedure, risks, benefits and alternatives. The patient understands, agrees and consents for the procedure. All questions were addressed.  A time out was performed prior to the initiation of the procedure.  The patient was positioned left lateral decubitus on the CT table and a limited chest CT was performed for procedural planning demonstrating grossly unchanged appearance of to approximately 5.8 x 3.2 cm mass within the subpleural aspect of the left lower lobe (image 43, series 2).  The operative site was prepped and draped in the usual sterile fashion.  Under sterile conditions and local anesthesia, a 17 gauge coaxial needle was advanced into the peripheral aspect of the nodule.  Positioning was confirmed with  intermittent CT fluoroscopy and was followed by the acquisition of 4 core needle biopsies with an 18 gauge core needle biopsy device.  Limited post procedural chest CT was negative for pneumothorax or additional complication.  The co-axial needle was removed and hemostasis was achieved with manual compression.  A dressing was placed.  The patient tolerated the procedure well without immediate postprocedural complication.  The patient was escorted to have an upright chest radiograph.  IMPRESSION:  Technically successful CT guided core needle biopsy of hypermetabolic mass within the left lower lobe.   Original Report Authenticated By: Tacey Ruiz, MD   REPORT OF SURGICAL PATHOLOGY INAL DIAGNOSIS Diagnosis Lung, needle/core biopsy(ies), LLL - POORLY DIFFERENTIATED INVASIVE SQUAMOUS CELL CARCINOMA WITH ASSOCIATED EXTENSIVE TUMOR NECROSIS. Microscopic Comment Dr. Arbutus Ped was paged on 12/23/12. (HCL:kh 12/23/12) Gwendolyn Grant LI MD Pathologist, Electronic Signature (Case signed 12/25/2012)  ASSESSMENT AND PLAN: this is a very pleasant 78 years old white female recently diagnosed with a stage IIb non-small cell lung cancer, squama cell carcinoma with large left lung mass as well as perihilar lymphadenopathy. I have a lengthy discussion with the patient and her daughter today about her current disease status and treatment options. I recommended for the patient to see cardiothoracic surgery for evaluation and consideration of surgical resection. If the patient is not a surgical candidate, I would consider her for a course of concurrent chemoradiation followed by reevaluation for surgical resection if she has good pulmonary function. The patient and her daughter agreed to the current plan. She would come back for follow up visit in one month for reevaluation She was advised to call immediately if she has any concerning symptoms and interval.   The patient voices understanding of current disease status and  treatment options and is in agreement with the current care plan.  All questions were answered. The patient knows to call the clinic with any problems, questions or concerns. We can certainly see the patient much sooner if necessary.  I spent 15 minutes counseling the patient face to face. The total time spent in the appointment was 25 minutes.

## 2013-01-01 ENCOUNTER — Institutional Professional Consult (permissible substitution) (INDEPENDENT_AMBULATORY_CARE_PROVIDER_SITE_OTHER): Payer: Medicare Other | Admitting: Surgery

## 2013-01-01 ENCOUNTER — Encounter: Payer: Self-pay | Admitting: *Deleted

## 2013-01-01 VITALS — BP 142/73 | HR 74 | Temp 97.6°F | Resp 20 | Ht 63.0 in | Wt 94.5 lb

## 2013-01-01 DIAGNOSIS — R918 Other nonspecific abnormal finding of lung field: Secondary | ICD-10-CM

## 2013-01-01 DIAGNOSIS — R222 Localized swelling, mass and lump, trunk: Secondary | ICD-10-CM

## 2013-01-01 NOTE — Progress Notes (Signed)
   Thoracic Treatment Summary Name: Neyra Pettie Date: 01/01/13 DOB: 12-22-2034 Your Medical Team Medical Oncologist: Dr. Arbutus Ped Radiation Oncologist: Pulmonologist: Surgeon: Dr. Laneta Simmers Type and Stage of Lung Cancer Non-Small Cell Carcinoma:  Squamous Cell Clinical Stage:  II Pathological Stage:  Clinical stage is based on radiology exams.  Pathological stage will be determined after surgery.  Staging is based on the size of the tumor, involvement of lymph nodes or not, and whether or not the cancer center has spread. Recommendations Recommendations: Surgical resection  These recommendations are based on information available as of today's consult.  This is subject to change depending further testing or exams. Next Steps Next Step: 1. Revonda Standard at [facility] office will call with appointments phone is [phone] 2. Schedule pulmonary function test then see Dr. Laneta Simmers to discuss results  Barriers to Care What do you perceive as a potential barrier that may prevent you from receiving your treatment plan? Nothing perceived at this time Resource for quit smoking given 1-800-QUIT-NOW Information given and explained regarding lung surgery Questions Willette Pa, RN BSN Thoracic Oncology Nurse Navigator at [phone]  Annabelle Harman is a nurse navigator that is available to assist you through your cancer journey.  She can answer your questions and/or provide resources regarding your treatment plan, emotional support, or financial concerns.

## 2013-01-02 ENCOUNTER — Telehealth: Payer: Self-pay | Admitting: Hematology and Oncology

## 2013-01-02 ENCOUNTER — Other Ambulatory Visit: Payer: Self-pay | Admitting: *Deleted

## 2013-01-02 DIAGNOSIS — R918 Other nonspecific abnormal finding of lung field: Secondary | ICD-10-CM

## 2013-01-02 NOTE — Telephone Encounter (Signed)
S/W CINDY FROM [facility] AND THE PT HAS ALREADY BEEN SEEN BY A THORACIC SURGEON ON 01/01/2013.

## 2013-01-03 ENCOUNTER — Encounter: Payer: Self-pay | Admitting: Surgery

## 2013-01-03 NOTE — Progress Notes (Signed)
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 This showed a pleural-based mass in the left lower lobe of the lung posteriorly measuring 5.8 x 3.0 x 5.4 cm. There was a 1.7 cm precarinal lymph node. She had a PET scan performed which showed hypermetabolic activity within the lung mass but no hypermetabolic activity within the precarinal lymph node. A CT guided needle biopsy of the lung mass showed squamous cell carcinoma.  Past Medical History  Diagnosis Date  . COPD (chronic obstructive pulmonary disease) 12/12/2012  . HTN (hypertension) 12/12/2012    History reviewed. No pertinent past surgical history.  History reviewed. No pertinent family history.  Social History History  Substance Use Topics  . Smoking status: Current Every Day Smoker -- 0.25 packs/day for 60 years    Types: Cigarettes  . Smokeless tobacco: Not on file  . Alcohol Use: Not on file    Current Outpatient Prescriptions  Medication Sig Dispense Refill  . COMBIVENT RESPIMAT 20-100 MCG/ACT AERS respimat Inhale into the lungs 4 (four) times daily as needed.      . diltiazem (TIAZAC) 120 MG 24 hr capsule Take 120 mg by mouth every morning.       [name] OVER THE COUNTER MEDICATION Take 1 capsule by mouth daily. Calcium carbonate 333 mg, magnesium oxide 133 mg and zinc sulfate 5 mg       No current facility-administered medications for this visit.    Allergies  Allergen Reactions  . Iohexol Rash    Broke out in a rash after an iv med at [facility].    Review of Systems    Constitutional: Positive for appetite change and unexpected weight change. Negative for fever, chills, diaphoresis, activity change and fatigue.  HENT: Negative.   Eyes: Negative.   Respiratory: Positive for cough. Negative for chest tightness and shortness of breath.   Cardiovascular: Negative.   Gastrointestinal: Negative.   Endocrine: Negative.   Genitourinary: Negative.   Musculoskeletal: Negative.   Skin: Negative.   Allergic/Immunologic: Negative.   Neurological: Negative.   Hematological: Negative.   Psychiatric/Behavioral: Negative.     BP 142/73  Pulse 74  Temp(Src) 97.6 F (36.4 C) (Oral)  Resp 20  Ht 5\' 3"  (1.6 m)  Wt 94 lb 8 oz (42.865 kg)  BMI 16.74 kg/m2 Physical Exam  Constitutional: She is oriented to person, place, and time. She appears well-developed and well-nourished. No distress.  HENT:  Head: Normocephalic and atraumatic.  Mouth/Throat: Oropharynx is clear and moist.  Eyes: Conjunctivae are normal. Pupils are equal, round, and reactive to light.  Neck: Normal range of motion. Neck supple. No JVD present. No thyromegaly present.  Cardiovascular: Normal rate, regular rhythm, normal heart sounds and intact distal pulses.   No murmur heard. Pulmonary/Chest: Effort normal and breath sounds normal. No respiratory distress. She has no wheezes. She has no rales. She exhibits no tenderness.  Abdominal: Soft. Bowel sounds are normal. She exhibits no distension  and no mass. There is no tenderness.  Musculoskeletal: She exhibits no edema and no tenderness.  Lymphadenopathy:    She has no cervical adenopathy.  Neurological: She is alert and oriented to person, place, and time. She has normal strength. No cranial nerve deficit or sensory deficit.  Skin: Skin is warm and dry.  Psychiatric: She has a normal mood and affect.     Diagnostic Tests:  *RADIOLOGY REPORT*   Clinical Data: 78 year old female with new diagnosis of lung cancer.  Staging.   CT HEAD  WITHOUT AND WITH CONTRAST   Technique:  Contiguous axial images were obtained from the base of the skull through the vertex without and with intravenous contrast.   Contrast: 80mL OMNIPAQUE IOHEXOL 300 MG/ML  SOLN   Comparison: PET CT 12/19/2012.   Findings: Visualized paranasal sinuses and mastoids are clear.  No acute or suspicious osseous lesion of the skull identified. Visualized scalp soft tissues are within normal limits. Postoperative changes to the globes.   Stable small volume extra-axial fluid in the left hemisphere and posterior fossa, most resembles a CSF hygroma.  Subsequent rightward midline shift of 4 mm, but otherwise no significant mass effect on the brain.  Basilar cisterns are patent. No acute intracranial hemorrhage identified.   No ventriculomegaly. Gray-white matter differentiation is within normal limits throughout the brain.  No abnormal enhancement identified.  Major intracranial vascular structures appear to be enhancing.  Dominant and somewhat dolichoectatic distal left vertebral artery. No evidence of cortically based acute infarction identified.  No intracranial metastasis identified.   IMPRESSION: 1.  No metastatic disease identified. 2.  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  Mild associated rightward midline shift, likely inconsequential. Probably these findings are chronic. 2.  Otherwise negative for age CT appearance of the brain.     Original Report Authenticated By: Erskine Speed, M.D.        *RADIOLOGY REPORT*   Clinical Data: Initial treatment strategy for lung mass.   NUCLEAR MEDICINE PET SKULL BASE TO THIGH   Fasting Blood Glucose:  97   Technique:  17.4 mCi F-18 FDG was injected intravenously. CT data was obtained and used for attenuation correction and anatomic localization only.  (This was not acquired as a diagnostic CT examination.) Additional exam technical data entered on technologist worksheet.   Comparison:  CT  12/03/2012   Findings:   Neck: There is a focus of focal radiotracer uptake left lateral to the C3 lamina which is felt to represent benign musculoskeletal uptake.   Chest:  Within the left lower lobe there is a 5.5 x 3.6 cm intensely hypermetabolic mass with SUV max = 14.8. This mass abuts the pleural surfaces over a broad segment.  No  clear evidence of rib involvement.  There is some misregistration of the imaging.   There is no hypermetabolic mediastinal or hilar lymph nodes.   There is extensive central lobular emphysema present.  There is some linear nodular thickening in the right upper lobe and right lower lobe which is likely post inflammatory or  infectious.   Abdomen/Pelvis:  No abnormal hypermetabolic activity within the liver, pancreas, adrenal glands, or spleen.  No hypermetabolic lymph nodes in the abdomen or pelvis.   Chronic hydronephrosis of the right kidney which is malpositioned.   Skeleton:  No focal hypermetabolic activity to suggest skeletal metastasis.   IMPRESSION:   1.  Large hypermetabolic mass in the left lower lobe abutting the pleural surfaces most consistent bronchogenic carcinoma. 2.  No  evidence of chest wall invasion. 3.  No evidence of mediastinal metastasis. 4.  Nodularity in the right upper lobe and right lower lobe is likely post infectious or inflammatory.     Original Report Authenticated By: Genevive Bi, M.D.        *RADIOLOGY REPORT*   Clinical Data: Lung mass noted on chest x-ray.   BUN and creatinine were obtained on site at [facility] at [address] Results:  BUN 0 mg/dL,  Creatinine 0.5 mg/dL.   CT CHEST WITH CONTRAST   Technique:  Multidetector CT imaging of the chest was performed following the standard protocol during bolus administration of intravenous contrast.   Contrast: 50mL OMNIPAQUE IOHEXOL 300 MG/ML  SOLN   Comparison: 12/01/2012 and 01/13/2010.   Findings: There is a  pleural-based mass located within the left lower lobe posteriorly measuring 5.8 x 3.0 x 5.4 cm in size.  This is worrisome for carcinoma of the lung.  There are emphysematous changes present.  There are scarring changes noted within the lingula.  In addition, there is patchy air space disease seen anteriorly within the right middle lobe. There are no additional nodules.  The central airways are patent.  There is a 1.7 cm precarinal mediastinal lymph node present (image #[id number]).  There are no additional enlarged lymph nodes.  There are no pleural effusions.  The adrenal glands have normal appearance.  There is a small (7 mm) stable right lobe hepatic cyst.   IMPRESSION:   1.  5.8 cm pleural based mass located within the left lower lobe worrisome for carcinoma of the lung. 2.  1.7 cm precarinal mediastinal lymph node. 3.  Emphysematous changes.     Original Report Authenticated By: Rolla Plate, M.D.    Impression:  She has a 5.8 centimeter pleural-based squamous cell carcinoma in the left lower lobe of the lung. There is no other hypermetabolic activity noted on PET scan. This is a stage IIB lung cancer at this time. I agree that surgical resection is the best treatment for this patient if her pulmonary function is adequate to tolerate a left lower lobectomy. I reviewed the CT scan and PET scan findings with the patient and her daughter. All their questions were answered. I stressed the importance of complete smoking cessation from this point on in preparation for possible lobectomy.  Plan:  She will be scheduled for pulmonary function testing and room air arterial blood gas. I'll see her back in the office after that to discuss results with her, answer any further questions, and plan surgical resection if indicated.

## 2013-01-14 ENCOUNTER — Encounter (HOSPITAL_COMMUNITY): Payer: Medicare Other

## 2013-01-14 ENCOUNTER — Ambulatory Visit: Payer: Medicare Other | Admitting: Surgery

## 2013-01-21 ENCOUNTER — Ambulatory Visit: Payer: Medicare Other | Admitting: Surgery

## 2013-01-21 ENCOUNTER — Encounter: Payer: Self-pay | Admitting: Surgery

## 2013-01-21 ENCOUNTER — Ambulatory Visit (HOSPITAL_COMMUNITY)
Admission: RE | Admit: 2013-01-21 | Discharge: 2013-01-21 | Disposition: A | Discharge status: Home / Self Care | Payer: Medicare Other | Source: Ambulatory Visit | Attending: Surgery | Admitting: Surgery

## 2013-01-21 ENCOUNTER — Ambulatory Visit (INDEPENDENT_AMBULATORY_CARE_PROVIDER_SITE_OTHER): Payer: Medicare Other | Admitting: Surgery

## 2013-01-21 ENCOUNTER — Encounter (HOSPITAL_COMMUNITY): Payer: Medicare Other

## 2013-01-21 VITALS — BP 139/68 | HR 83 | Resp 18 | Ht 63.0 in | Wt 94.0 lb

## 2013-01-21 DIAGNOSIS — C3492 Malignant neoplasm of unspecified part of left bronchus or lung: Secondary | ICD-10-CM

## 2013-01-21 DIAGNOSIS — R918 Other nonspecific abnormal finding of lung field: Secondary | ICD-10-CM

## 2013-01-21 DIAGNOSIS — C3490 Malignant neoplasm of unspecified part of unspecified bronchus or lung: Secondary | ICD-10-CM

## 2013-01-21 DIAGNOSIS — J449 Chronic obstructive pulmonary disease, unspecified: Secondary | ICD-10-CM | POA: Insufficient documentation

## 2013-01-21 DIAGNOSIS — F17200 Nicotine dependence, unspecified, uncomplicated: Secondary | ICD-10-CM | POA: Insufficient documentation

## 2013-01-21 DIAGNOSIS — R059 Cough, unspecified: Secondary | ICD-10-CM | POA: Insufficient documentation

## 2013-01-21 DIAGNOSIS — R05 Cough: Secondary | ICD-10-CM | POA: Insufficient documentation

## 2013-01-21 DIAGNOSIS — J4489 Other specified chronic obstructive pulmonary disease: Secondary | ICD-10-CM | POA: Insufficient documentation

## 2013-01-21 LAB — PULMONARY FUNCTION TEST

## 2013-01-21 MED ORDER — ALBUTEROL SULFATE (5 MG/ML) 0.5% IN NEBU
2.5000 mg | INHALATION_SOLUTION | Freq: Once | RESPIRATORY_TRACT | Status: AC
  Administered 2013-01-21: 2.5 mg via RESPIRATORY_TRACT

## 2013-01-21 NOTE — Progress Notes (Signed)
       [address]       [place] [address]             [phone]        HPI:  The patient is a 78 year old active smoker with a past medical history significant for hypertension and COPD who had a chest x-ray performed due to some weight loss and shortness of breath. This showed a pleural-based mass in the left lower lobe of the lung posteriorly measuring 5.8 x 3.0 x 5.4 cm. There was a 1.7 cm precarinal lymph node. She had a PET scan performed which showed hypermetabolic activity within the lung mass but no hypermetabolic activity within the precarinal lymph node. A CT guided needle biopsy of the lung mass showed squamous cell carcinoma. She returns today to discuss the results of her PFTs and room air arterial blood gas. She continues to smoke.   Current Outpatient Prescriptions  Medication Sig Dispense Refill  . COMBIVENT RESPIMAT 20-100 MCG/ACT AERS respimat Inhale into the lungs 4 (four) times daily as needed.      . diltiazem (TIAZAC) 120 MG 24 hr capsule Take 120 mg by mouth every morning.       [name] OVER THE COUNTER MEDICATION Take 1 capsule by mouth daily. Calcium carbonate 333 mg, magnesium oxide 133 mg and zinc sulfate 5 mg       No current facility-administered medications for this visit.     Physical Exam: BP 139/68  Pulse 83  Resp 18  Ht 5\' 3"  (1.6 m)  Wt 94 lb (42.638 kg)  BMI 16.66 kg/m2  SpO2 90% She is a thin, elderly, and frail-appearing woman. Lung exam reveals decreased breath sounds throughout. Cardiac exam shows regular rate and rhythm with normal heart sounds.  Diagnostic Tests:  Pulmonary function testing on 01/21/2013 shows an FEV1 of 0.81 which is 42% predicted. There is no improvement with bronchodilator. FVC was 1.55 which is 61% predicted. Her diffusion capacity was 21% of predicted.  Room air arterial blood gas shows a pH of 7.43, PCO2 of 42, PO2 of 52, and a sat of 87%.   Impression:  She is a frail, chronically  ill-appearing 78 year old woman with a 5.8 cm squamous cell carcinoma of the left lower lobe of the lung. Her pulmonary function testing shows a severe obstructive defect as well as a moderate restrictive defect and severe decrease in her diffusion capacity. She has resting hypoxemia with a PO2 of 52 on her room air arterial blood gas. She is still smoking. I think she would be at prohibitive risk for surgical resection by left lower lobectomy. This tumor is too large to do a wedge resection. I reviewed her CT and PET scan again with her and her daughter. I discussed her pulmonary function testing and blood gas and the reasons that I don't feel she is a surgical candidate. She understands.  Plan:  She will be referred back to [facility] clinic for consideration of nonsurgical therapy with radiation and chemotherapy.

## 2013-01-27 ENCOUNTER — Telehealth: Payer: Self-pay | Admitting: Internal Medicine

## 2013-01-27 NOTE — Telephone Encounter (Signed)
C/D 01/27/13 for appt. 01/29/13 °

## 2013-01-28 ENCOUNTER — Telehealth: Payer: Self-pay | Admitting: *Deleted

## 2013-01-28 NOTE — Telephone Encounter (Signed)
Spoke with pt regarding appt for MTOC.  She verbalized understanding of time and place and seeing at physical therapist tomorrow.

## 2013-01-29 ENCOUNTER — Ambulatory Visit: Payer: Medicare Other | Admitting: Physical Therapy

## 2013-01-29 ENCOUNTER — Encounter: Payer: Self-pay | Admitting: Radiation Oncology

## 2013-01-29 ENCOUNTER — Ambulatory Visit
Admission: RE | Admit: 2013-01-29 | Discharge: 2013-01-29 | Disposition: A | Discharge status: Home / Self Care | Payer: Medicare Other | Source: Ambulatory Visit | Attending: Radiation Oncology | Admitting: Radiation Oncology

## 2013-01-29 ENCOUNTER — Telehealth: Payer: Self-pay | Admitting: *Deleted

## 2013-01-29 ENCOUNTER — Ambulatory Visit: Payer: Medicare Other | Admitting: Internal Medicine

## 2013-01-29 NOTE — Progress Notes (Signed)
Radiation Oncology         [phone]) [phone] ________________________________  Initial outpatient Consultation  Name: [name]  MRN: [id number]  Date: 01/29/2013  DOB: 09-Nov-1934  CC:KIM, [name], MD  Alleen Borne, MD   REFERRING PHYSICIAN: Alleen Borne, MD  DIAGNOSIS: 78 yo woman with squamous cell carcinoma of the left lower lung - Stage IB versus IIA  HISTORY OF PRESENT ILLNESS::[name] is a 78 y.o. female smoker with COPD who had a chest x-ray performed due to some weight loss and shortness of breath. CXR showed a left lower lung opacity.  CT was performed confirming a pleural-based mass in the left lower lobe of the lung posteriorly measuring 5.8 x 3.0 x 5.4 cm. There was a 1.7 cm precarinal lymph node. She had a PET scan performed which showed hypermetabolic activity within the lung mass but no hypermetabolic activity within the precarinal lymph node. A CT guided needle biopsy of the lung mass showed squamous cell carcinoma.  Head CT shows no brain mets.  She has an FEV-1 of 0.81 liters and is not a candidate for surgical therapy.  PREVIOUS RADIATION THERAPY: No  PAST MEDICAL HISTORY:  has a past medical history of COPD (chronic obstructive pulmonary disease) (12/12/2012) and HTN (hypertension) (12/12/2012).    PAST SURGICAL HISTORY:History reviewed. No pertinent past surgical history.  FAMILY HISTORY: family history is not on file.  SOCIAL HISTORY:  reports that she has been smoking Cigarettes.  She has a 15 pack-year smoking history. She does not have any smokeless tobacco history on file. She reports that she does not use illicit drugs.  ALLERGIES: Iohexol  MEDICATIONS:  Current Outpatient Prescriptions  Medication Sig Dispense Refill  . COMBIVENT RESPIMAT 20-100 MCG/ACT AERS respimat Inhale into the lungs 4 (four) times daily as needed.      . diltiazem (TIAZAC) 120 MG 24 hr capsule Take 120 mg by mouth every morning.       [name] OVER THE COUNTER MEDICATION Take 1  capsule by mouth daily. Calcium carbonate 333 mg, magnesium oxide 133 mg and zinc sulfate 5 mg       No current facility-administered medications for this encounter.    REVIEW OF SYSTEMS:  A 15 point review of systems is documented in the electronic medical record. This was obtained by the nursing staff. However, I reviewed this with the patient to discuss relevant findings and make appropriate changes.  Pertinent items are noted in HPI.  She denies any pain at the primary tumor site to suggest T3 chest wall invasion.   PHYSICAL EXAM:   Per thoracic surgery BP 142/73  Pulse 74  Temp(Src) 97.6 F (36.4 C) (Oral)  Resp 20  Ht 5\' 3"  (1.6 m)  Wt 94 lb 8 oz (42.865 kg)  BMI 16.74 kg/m2 Physical Exam  Constitutional: She is oriented to person, place, and time. She appears well-developed and well-nourished. No distress.  HENT: Head: Normocephalic and atraumatic.  Mouth/Throat: Oropharynx is clear and moist.  Eyes: Conjunctivae are normal. Pupils are equal, round, and reactive to light.  Neck: Normal range of motion. Neck supple. No JVD present. No thyromegaly present.  Cardiovascular: Normal rate, regular rhythm, normal heart sounds and intact distal pulses.  No murmur heard.  Pulmonary/Chest: Effort normal and breath sounds normal. No respiratory distress. She has no wheezes. She has no rales. She exhibits no tenderness.  Abdominal: Soft. Bowel sounds are normal. She exhibits no distension and no mass. There is no tenderness.  Musculoskeletal:  She exhibits no edema and no tenderness.  Lymphadenopathy:  She has no cervical adenopathy.  Neurological: She is alert and oriented to person, place, and time. She has normal strength. No cranial nerve deficit or sensory deficit.  Skin: Skin is warm and dry.  Psychiatric: She has a normal mood and affect.   KPS = 80  100 - Normal; no complaints; no evidence of disease. 90   - Able to carry on normal activity; minor signs or symptoms of  disease. 80   - Normal activity with effort; some signs or symptoms of disease. 16   - Cares for self; unable to carry on normal activity or to do active work. 60   - Requires occasional assistance, but is able to care for most of his personal needs. 50   - Requires considerable assistance and frequent medical care. 40   - Disabled; requires special care and assistance. 30   - Severely disabled; hospital admission is indicated although death not imminent. 20   - Very sick; hospital admission necessary; active supportive treatment necessary. 10   - Moribund; fatal processes progressing rapidly. 0     - Dead  Karnofsky DA, Abelmann WH, Craver LS and Burchenal [facility] [phone]) The use of the nitrogen mustards in the palliative treatment of carcinoma: with particular reference to bronchogenic carcinoma Cancer 1 634-56  LABORATORY DATA:  Lab Results  Component Value Date   WBC 7.2 12/22/2012   HGB 12.8 12/22/2012   HCT 39.7 12/22/2012   MCV 93.6 12/22/2012   PLT 368 12/22/2012   Lab Results  Component Value Date   NA 139 12/12/2012   K 4.1 12/12/2012   CL 102 10/25/2007   CO2 30* 12/12/2012   Lab Results  Component Value Date   ALT 10 12/12/2012   AST 13 12/12/2012   ALKPHOS 82 12/12/2012   BILITOT 0.35 12/12/2012     RADIOGRAPHY: See HPI.    IMPRESSION: This patient is a very nice 78 yo woman with squamous cell carcinoma of the left lower lung who is not a surgical candidate.  Since her PET scan showed no hypermetabolism in the single enlarged mediastinal node and squamous cell carcinoma has been associated with benign mediastinal nodes (false positives) in the literature, I would probably assign her clinical stage of T2b N0 or Stage IB versus T2b N1 or stage IIA.  I will look forward to discussing the stage with Dr. Arbutus Ped.  The cancer stage would guide the course of therapy.    If it is a IB, the patient would be well suited for stereotactic body radiotherapy.  The abutment of the chest wall  would lead to an increased risk of rib/chest wall discomfort later.  PLAN:Today, I talked to the patient and family about the findings and work-up thus far.  We discussed the natural history of disease and general treatment, highlighting the role or radiotherapy in the management.  We discussed the available radiation techniques, and focused on the details of logistics and delivery.  We reviewed the anticipated acute and late sequelae associated with radiation in this setting.  The patient was encouraged to ask questions that I answered to the best of my ability.  I filled out a patient counseling form during our discussion including treatment diagrams.  We retained a copy for our records.  The patient would like to proceed with radiation and will be scheduled for CT simulation 9/5.  I spent 60 minutes minutes face to face with the patient  and more than 50% of that time was spent in counseling and/or coordination of care.    ------------------------------------------------  Artist Pais. Kathrynn Running, M.D.

## 2013-01-29 NOTE — Telephone Encounter (Signed)
Called and notified pt that Dr. Arbutus Ped is out sick.  She will see Dr. Kathrynn Running.  She verbalized understanding and will be here for MTOC

## 2013-02-03 ENCOUNTER — Other Ambulatory Visit: Payer: Self-pay | Admitting: *Deleted

## 2013-02-04 ENCOUNTER — Telehealth: Payer: Self-pay | Admitting: Internal Medicine

## 2013-02-04 ENCOUNTER — Other Ambulatory Visit: Payer: Medicare Other | Admitting: Lab

## 2013-02-04 ENCOUNTER — Ambulatory Visit: Payer: Medicare Other

## 2013-02-04 NOTE — Telephone Encounter (Signed)
s.w. pt and advised on 10.2.14 appt....pt ok and aware

## 2013-02-06 ENCOUNTER — Ambulatory Visit
Admission: RE | Admit: 2013-02-06 | Discharge: 2013-02-06 | Disposition: A | Discharge status: Home / Self Care | Payer: Medicare Other | Source: Ambulatory Visit | Attending: Radiation Oncology | Admitting: Radiation Oncology

## 2013-02-06 ENCOUNTER — Encounter: Payer: Self-pay | Admitting: Radiation Oncology

## 2013-02-06 DIAGNOSIS — C3430 Malignant neoplasm of lower lobe, unspecified bronchus or lung: Secondary | ICD-10-CM | POA: Insufficient documentation

## 2013-02-06 DIAGNOSIS — C3490 Malignant neoplasm of unspecified part of unspecified bronchus or lung: Secondary | ICD-10-CM | POA: Insufficient documentation

## 2013-02-06 DIAGNOSIS — R159 Full incontinence of feces: Secondary | ICD-10-CM | POA: Insufficient documentation

## 2013-02-06 DIAGNOSIS — Z510 Encounter for antineoplastic radiation therapy: Secondary | ICD-10-CM | POA: Insufficient documentation

## 2013-02-06 HISTORY — DX: Malignant neoplasm of unspecified part of unspecified bronchus or lung: C34.90

## 2013-02-06 NOTE — Addendum Note (Signed)
Encounter addended by: Agnes Lawrence, RN on: 02/06/2013  9:46 AM<BR>     Documentation filed: Charges VN

## 2013-02-06 NOTE — Progress Notes (Signed)
  Radiation Oncology         [phone] ________________________________  Name: [name] MRN: [id number]  Date: 02/06/2013  DOB: 1935/02/15  STEREOTACTIC BODY RADIOTHERAPY SIMULATION AND TREATMENT PLANNING NOTE  DIAGNOSIS:  78 yo woman with squamous cell carcinoma of the left lower lung - Stage IB   NARRATIVE:  The patient was brought to the CT Simulation planning suite.  Identity was confirmed.  All relevant records and images related to the planned course of therapy were reviewed.  The patient freely provided informed written consent to proceed with treatment after reviewing the details related to the planned course of therapy. The consent form was witnessed and verified by the simulation staff.  Then, the patient was set-up in a stable reproducible  supine position for radiation therapy.  A BodyFix immobilization pillow was fabricated for reproducible positioning.  Then I personally applied the abdominal compression paddle to limit respiratory excursion.  4D respiratoy motion management CT images were obtained.  Surface markings were placed.  The CT images were loaded into the planning software.  Then, using Cine, MIP, and standard views, the internal target volume (ITV) and planning target volumes (PTV) were delinieated, and avoidance structures were contoured.  Treatment planning then occurred.  The radiation prescription was entered and confirmed.  A total of two complex treatment devices were fabricated in the form of the BodyFix immobilization pillow and a neck accuform cushion.  I have requested : 3D Simulation  I have requested a DVH of the following structures: Heart, Lungs, Esophagus, Chest Wall, Brachial Plexus, Major Blood Vessels, and targets.  PLAN:  The patient will receive 60 Gy in 5 fractions.  ________________________________  Artist Pais Kathrynn Running, M.D.

## 2013-02-06 NOTE — Progress Notes (Signed)
Reports an occasional cough with productive sputum. Reports she spits up a small amount of clear sputum. Denies pain or difficult swallowing foods. Reports occasional shortness of breath with exertion. Reports she continues to smoke occasionally. Denies headaches, dizziness, nausea or vomiting. Denies night sweats. Maintaining weight. Reports a good appetite.

## 2013-02-17 ENCOUNTER — Encounter: Payer: Self-pay | Admitting: Radiation Oncology

## 2013-02-17 ENCOUNTER — Ambulatory Visit
Admission: RE | Admit: 2013-02-17 | Discharge: 2013-02-17 | Disposition: A | Discharge status: Home / Self Care | Payer: Medicare Other | Source: Ambulatory Visit | Attending: Radiation Oncology | Admitting: Radiation Oncology

## 2013-02-17 NOTE — Progress Notes (Signed)
  Radiation Oncology         [phone] ________________________________  Name: [name] MRN: [id number]  Date: 02/17/2013  DOB: 10-08-1934  Stereotactic Body Radiotherapy Treatment Procedure Note  NARRATIVE:  [name] was brought to the stereotactic radiation treatment machine and placed supine on the CT couch. The patient was set up for stereotactic body radiotherapy on the body fix pillow.  3D TREATMENT PLANNING AND DOSIMETRY:  The patient's radiation plan was reviewed and approved prior to starting treatment.  It showed 3-dimensional radiation distributions overlaid onto the planning CT.  The [facility] for the target structures as well as the organs at risk were reviewed. The documentation of this is filed in the radiation oncology EMR.  SIMULATION VERIFICATION:  The patient underwent CT imaging on the treatment unit.  These were carefully aligned to document that the ablative radiation dose would cover the left lung target volume and maximally spare the nearby organs at risk according to the planned distribution.  SPECIAL TREATMENT PROCEDURE: [name] received high dose ablative stereotactic body radiotherapy to the planned left lung target volume without unforeseen complications. Treatment was delivered uneventfully. She received 1000 cGy of a prescribed 5000 cGy in 5 sessions. The high doses associated with stereotactic body radiotherapy and the significant potential risks require careful treatment set up and patient monitoring constituting a special treatment procedure   STEREOTACTIC TREATMENT MANAGEMENT:  Following delivery, the patient was evaluated clinically. The patient tolerated treatment without significant acute effects, and was discharged to home in stable condition.    PLAN: Continue treatment as planned.  ------------------------------------------------        Maryln Gottron, MD

## 2013-02-19 ENCOUNTER — Ambulatory Visit
Admission: RE | Admit: 2013-02-19 | Discharge: 2013-02-19 | Disposition: A | Discharge status: Home / Self Care | Payer: Medicare Other | Source: Ambulatory Visit | Attending: Radiation Oncology | Admitting: Radiation Oncology

## 2013-02-19 ENCOUNTER — Ambulatory Visit: Admission: RE | Admit: 2013-02-19 | Payer: Medicare Other | Source: Ambulatory Visit | Admitting: Radiation Oncology

## 2013-02-19 ENCOUNTER — Encounter: Payer: Self-pay | Admitting: *Deleted

## 2013-02-19 NOTE — Progress Notes (Signed)
  Radiation Oncology         [phone] ________________________________  Name: [name]  MRN: [id number]  Date: 02/19/2013  DOB: May 22, 1935  Stereotactic Body Radiotherapy Treatment Procedure Note  NARRATIVE:  Zahari Fazzino was brought to the stereotactic radiation treatment machine and placed supine on the CT couch. The patient was set up for stereotactic body radiotherapy on the body fix pillow.  3D TREATMENT PLANNING AND DOSIMETRY:  The patient's radiation plan was reviewed and approved prior to starting treatment.  It showed 3-dimensional radiation distributions overlaid onto the planning CT.  The [facility] for the target structures as well as the organs at risk were reviewed. The documentation of this is filed in the radiation oncology EMR.  SIMULATION VERIFICATION:  The patient underwent CT imaging on the treatment unit.  These were carefully aligned to document that the ablative radiation dose would cover the target volume and maximally spare the nearby organs at risk according to the planned distribution.  SPECIAL TREATMENT PROCEDURE: Madisynn Salway received high dose ablative stereotactic body radiotherapy to the planned target volume without unforeseen complications. Treatment was delivered uneventfully. The high doses associated with stereotactic body radiotherapy and the significant potential risks require careful treatment set up and patient monitoring constituting a special treatment procedure   STEREOTACTIC TREATMENT MANAGEMENT:  Following delivery, the patient was evaluated clinically. The patient tolerated treatment without significant acute effects, and was discharged to home in stable condition.    PLAN: Continue treatment as planned.  ________________________________  Artist Pais. Kathrynn Running, M.D.

## 2013-02-19 NOTE — Progress Notes (Signed)
Clinical Social Work provided patient with information on transportation resources including [employer] and [employer] Patient verbalized understanding and plans to contact CSW with any questions or concerns.  Kathrin Penner, MSW, LCSW Clinical Social Worker [facility] [phone]

## 2013-02-23 ENCOUNTER — Encounter: Payer: Self-pay | Admitting: Radiation Oncology

## 2013-02-23 ENCOUNTER — Ambulatory Visit
Admission: RE | Admit: 2013-02-23 | Discharge: 2013-02-23 | Disposition: A | Discharge status: Home / Self Care | Payer: Medicare Other | Source: Ambulatory Visit | Attending: Radiation Oncology | Admitting: Radiation Oncology

## 2013-02-23 DIAGNOSIS — R918 Other nonspecific abnormal finding of lung field: Secondary | ICD-10-CM

## 2013-02-23 NOTE — Progress Notes (Signed)
  Radiation Oncology         [phone] ________________________________  Name: [name]  MRN: [id number]  Date: 02/23/2013  DOB: 1935-02-21  Stereotactic Body Radiotherapy Treatment Procedure Note  NARRATIVE:  Sevin Farone was brought to the stereotactic radiation treatment machine and placed supine on the CT couch. The patient was set up for stereotactic body radiotherapy on the body fix pillow.  3D TREATMENT PLANNING AND DOSIMETRY:  The patient's radiation plan was reviewed and approved prior to starting treatment.  It showed 3-dimensional radiation distributions overlaid onto the planning CT.  The [facility] for the target structures as well as the organs at risk were reviewed. The documentation of this is filed in the radiation oncology EMR.  SIMULATION VERIFICATION:  The patient underwent CT imaging on the treatment unit.  These were carefully aligned to document that the ablative radiation dose would cover the target volume and maximally spare the nearby organs at risk according to the planned distribution.  SPECIAL TREATMENT PROCEDURE: Khaya Fano received high dose ablative stereotactic body radiotherapy to the planned target volume without unforeseen complications. Treatment was delivered uneventfully. The high doses associated with stereotactic body radiotherapy and the significant potential risks require careful treatment set up and patient monitoring constituting a special treatment procedure   STEREOTACTIC TREATMENT MANAGEMENT:  Following delivery, the patient was evaluated clinically. The patient tolerated treatment without significant acute effects, and was discharged to home in stable condition.    PLAN: Continue treatment as planned.  ________________________________  Artist Pais. Kathrynn Running, M.D.

## 2013-02-25 ENCOUNTER — Ambulatory Visit
Admission: RE | Admit: 2013-02-25 | Discharge: 2013-02-25 | Disposition: A | Discharge status: Home / Self Care | Payer: Medicare Other | Source: Ambulatory Visit | Attending: Radiation Oncology | Admitting: Radiation Oncology

## 2013-02-25 NOTE — Progress Notes (Signed)
  Radiation Oncology         [phone] ________________________________  Name: Collen Hostler MRN: [id number]  Date: 02/25/2013  DOB: [birth year]  Stereotactic Body Radiotherapy Treatment Procedure Note  NARRATIVE:  Chantalle Defilippo was brought to the stereotactic radiation treatment machine and placed supine on the CT couch. The patient was set up for stereotactic body radiotherapy on the body fix pillow.  3D TREATMENT PLANNING AND DOSIMETRY:  The patient's radiation plan was reviewed and approved prior to starting treatment.  It showed 3-dimensional radiation distributions overlaid onto the planning CT.  The [facility] for the target structures as well as the organs at risk were reviewed. The documentation of this is filed in the radiation oncology EMR.  SIMULATION VERIFICATION:  The patient underwent CT imaging on the treatment unit.  These were carefully aligned to document that the ablative radiation dose would cover the target volume and maximally spare the nearby organs at risk according to the planned distribution.  SPECIAL TREATMENT PROCEDURE: Shaylyn Lauman received high dose ablative stereotactic body radiotherapy to the planned target volume without unforeseen complications. Treatment was delivered uneventfully. The high doses associated with stereotactic body radiotherapy and the significant potential risks require careful treatment set up and patient monitoring constituting a special treatment procedure   STEREOTACTIC TREATMENT MANAGEMENT:  Following delivery, the patient was evaluated clinically. The patient tolerated treatment without significant acute effects, and was discharged to home in stable condition.    PLAN: Continue treatment as planned.  ________________________________  Artist Pais. Kathrynn Running, M.D.

## 2013-02-27 ENCOUNTER — Encounter: Payer: Self-pay | Admitting: Radiation Oncology

## 2013-02-27 ENCOUNTER — Ambulatory Visit
Admission: RE | Admit: 2013-02-27 | Discharge: 2013-02-27 | Disposition: A | Discharge status: Home / Self Care | Payer: Medicare Other | Source: Ambulatory Visit | Attending: Radiation Oncology | Admitting: Radiation Oncology

## 2013-02-27 DIAGNOSIS — Z923 Personal history of irradiation: Secondary | ICD-10-CM

## 2013-02-27 HISTORY — DX: Personal history of irradiation: Z92.3

## 2013-02-27 NOTE — Progress Notes (Signed)
Reports an occasional productive cough with white sputum worse at night. Reports she continues to use Combivent. Reports shortness of breath with exertion. Excellent appetite. Denies difficulty swallowing. No skin changes to chest or back. Provided an appointment card to return in one month for follow up.

## 2013-02-27 NOTE — Progress Notes (Signed)
  Radiation Oncology         [phone] ________________________________  Name: [name] MRN: [id number]  Date: 02/27/2013  DOB: January 08, 1935  Weekly Radiation Therapy Management  Current Dose: 50 Gy     Planned Dose:  50 Gy  Narrative . . . . . . . . The patient presents for routine under treatment assessment.                                                      The patient is without complaint.                                 Set-up films were reviewed.                                 The chart was checked. Physical Findings. . .  weight is 94 lb 14.4 oz (43.046 kg). Her blood pressure is 129/59 and her pulse is 80. Her respiration is 18 and oxygen saturation is 98%. . Weight essentially stable.  No significant changes. Impression . . . . . . . The patient is  tolerating radiation. Plan . . . . . . . . . . . . Continue treatment as planned.  ________________________________  Artist Pais. Kathrynn Running, M.D.

## 2013-02-27 NOTE — Progress Notes (Signed)
  Radiation Oncology         [phone] ________________________________  Name: Yvette Loveless MRN: [id number]  Date: 02/27/2013  DOB: 02/02/1935  Stereotactic Body Radiotherapy Treatment Procedure Note  NARRATIVE:  Sumayya Muha was brought to the stereotactic radiation treatment machine and placed supine on the CT couch. The patient was set up for stereotactic body radiotherapy on the body fix pillow.  3D TREATMENT PLANNING AND DOSIMETRY:  The patient's radiation plan was reviewed and approved prior to starting treatment.  It showed 3-dimensional radiation distributions overlaid onto the planning CT.  The [facility] for the target structures as well as the organs at risk were reviewed. The documentation of this is filed in the radiation oncology EMR.  SIMULATION VERIFICATION:  The patient underwent CT imaging on the treatment unit.  These were carefully aligned to document that the ablative radiation dose would cover the target volume and maximally spare the nearby organs at risk according to the planned distribution.  SPECIAL TREATMENT PROCEDURE: Avina Vanatta received high dose ablative stereotactic body radiotherapy to the planned target volume without unforeseen complications. Treatment was delivered uneventfully. The high doses associated with stereotactic body radiotherapy and the significant potential risks require careful treatment set up and patient monitoring constituting a special treatment procedure   STEREOTACTIC TREATMENT MANAGEMENT:  Following delivery, the patient was evaluated clinically. The patient tolerated treatment without significant acute effects, and was discharged to home in stable condition.    PLAN: Continue treatment as planned.  ________________________________  Artist Pais. Kathrynn Running, M.D.

## 2013-02-27 NOTE — Progress Notes (Signed)
  Radiation Oncology         [phone] ________________________________  Name: [name]  MRN: [id number]  Date: 02/27/2013  DOB: 1934-12-30  Chart Note:  The patient was mentioning that she has been suffering with episodes of bowel incontinence.  She plans to follow-up with her primary care doctor about this, but, I mentioned that [facility] does offer anal manometry followed by pelvic floor biofeedback triaining if she is interested in travelling to [facility].  Here is info from their site:  [facility] GI Motility Unit   About the GI Motility Unit Established by Dr. Lala Lund, the [facility] GI Motility Unit is nationally renowned. Each year we perform more than 3,500 GI motility procedures.  Available motility procedures Anorectal Manometry to evaluate constipation (including excessive straining and incomplete evacuation), fecal incontinence, and rectal pain.  Esophageal Manometry (High Resolution) to evaluate difficulty swallowing, heartburn, chest pain, and for pre-operative evaluation.  Esophageal Function Test combines esophageal manometry with impedance testing to assess bolus clearance.  pH Probe evaluates the frequency and severity of acid reflux, as well as its association with heartburn, regurgitation, chest pain, and cough.  Impedance/pH probe evaluates the frequency and severity of acid and non-acid reflux, as well as its association with heartburn, regurgitation, chest pain, and cough.  Breath test for Helicobacter Pylori (C-13) a non-invasive test for H. Pylori.  Hydrogen Breath Test for Bacterial Overgrowth to evaluate bloating, diarrhea, and malabsorption.  Hydrogen Breath Test for Lactose Malabsorption to evaluate lactose intolerance.  Pelvic Floor Biofeedback. We have a dedicated Pelvic Floor Biofeedback Clinic led by Dr. Nicole Kindred, RN, PhD, a full-time biofeedback therapist. Appropriate referrals are patients with fecal incontinence, constipation due to pelvic floor  dyssenergia, and chronic proctalgia. Anorectal manometry is first required.   How to refer a patient You may directly refer patients for motility testing by simply faxing a completed procedure request form (available at [email or web address]) to [phone]. Our scheduling office will directly schedule the appointment and mail the patient information about the appointment and procedure. Alternatively, some patients are first referred for outpatient consultation [see here].  How to contact us During normal business hours please call [phone]. After hours, messages may be left at [phone].   ________________________________  Artist Pais. Kathrynn Running, M.D.

## 2013-02-27 NOTE — Addendum Note (Signed)
Encounter addended by: Oneita Hurt, MD on: 02/27/2013 12:38 PM<BR>     Documentation filed: Clinical Notes, Notes Section

## 2013-03-02 NOTE — Progress Notes (Signed)
  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 The patient was subsequently treated with 2 volumetric ARC therapy in intensity modulated beams delivering 6 megavolt photons in the flattening filter free beam mode. The plan was optimized to maintain all normal tissue tolerances at or below published guidelines. This was not possible with regard to the chest wall dose given the size and location of the tumor was brought appointment of the chest wall. Accordingly, the chest wall tolerance was exceeded. The patient's skin dose however was carefully maintained below published tolerance levels despite her diminutive stature and limited spacing between the skin and rib cage. It is hoped that this course of therapy we'll not cause any chest wall necrosis or skin complications. She will be at increased risk for rib discomfort and perhaps fractures in the future. This was carefully explained to the patient and unavoidable.  Narrative: The patient tolerated radiation treatment relatively well.   She had no acute side effects of radiation therapy. Skin check on completion of radiation showed no erythema or other reaction.  Plan: The patient has completed radiation treatment. The patient will return to radiation oncology clinic for routine followup in one month. I advised them to call or return sooner if  they have any questions or concerns related to their recovery or treatment. The patient was counseled regarding skin care following radiation advised to medially return to radiation oncology clinic if she notes a rash or other skin irritation in the treated area. She was encouraged not to present for management of radiation dermatitis to non-radiation oncology trained physicians, if possible. ________________________________  Artist Pais. Kathrynn Running, M.D.

## 2013-03-05 ENCOUNTER — Telehealth: Payer: Self-pay | Admitting: Internal Medicine

## 2013-03-05 ENCOUNTER — Ambulatory Visit (HOSPITAL_BASED_OUTPATIENT_CLINIC_OR_DEPARTMENT_OTHER): Payer: Medicare Other | Admitting: Internal Medicine

## 2013-03-05 ENCOUNTER — Encounter: Payer: Self-pay | Admitting: Internal Medicine

## 2013-03-05 ENCOUNTER — Other Ambulatory Visit (HOSPITAL_BASED_OUTPATIENT_CLINIC_OR_DEPARTMENT_OTHER): Payer: Medicare Other | Admitting: Lab

## 2013-03-05 DIAGNOSIS — R05 Cough: Secondary | ICD-10-CM

## 2013-03-05 DIAGNOSIS — C3430 Malignant neoplasm of lower lobe, unspecified bronchus or lung: Secondary | ICD-10-CM

## 2013-03-05 LAB — CBC WITH DIFFERENTIAL/PLATELET
BASO%: 0.5 % (ref 0.0–2.0)
Basophils Absolute: 0 10*3/uL (ref 0.0–0.1)
HCT: 36.7 % (ref 34.8–46.6)
HGB: 12.2 g/dL (ref 11.6–15.9)
MONO#: 0.9 10*3/uL (ref 0.1–0.9)
NEUT%: 83 % — ABNORMAL HIGH (ref 38.4–76.8)
WBC: 10.2 10*3/uL (ref 3.9–10.3)
lymph#: 0.7 10*3/uL — ABNORMAL LOW (ref 0.9–3.3)

## 2013-03-05 LAB — COMPREHENSIVE METABOLIC PANEL (CC13)
ALT: 6 U/L (ref 0–55)
Albumin: 3.1 g/dL — ABNORMAL LOW (ref 3.5–5.0)
BUN: 8.8 mg/dL (ref 7.0–26.0)
CO2: 28 mEq/L (ref 22–29)
Calcium: 9.6 mg/dL (ref 8.4–10.4)
Chloride: 100 mEq/L (ref 98–109)
Creatinine: 0.6 mg/dL (ref 0.6–1.1)

## 2013-03-05 NOTE — Telephone Encounter (Signed)
gv and pritned appt sched and avs forpt for Jan 2015 °
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
## 2013-03-05 NOTE — Patient Instructions (Signed)
Followup visit in 3 months with repeat CT scan of the chest. 

## 2013-04-02 ENCOUNTER — Encounter: Payer: Self-pay | Admitting: *Deleted

## 2013-04-02 NOTE — Progress Notes (Signed)
[facility] Clinical Social Work  Pt was referred to CSW by nurse due to possible transportation concerns and for assessment of psychosocial needs.  Clinical Social Worker phoned patient at home and left vm to call CSW if there were concerns re. Transportation. CSW awaits return call and will address needs appropriately.    Doreen Salvage, LCSW Clinical Social Worker [facility] Wednesday, Thursday and Friday Phone: [phone] Fax: [phone]

## 2013-04-02 NOTE — Progress Notes (Unsigned)
[facility] Clinical Social Work  Clinical Social Work received return phone call from Pt. She reports a have a ride for her appt on 04/08/13, but will need assistance with her remaining appointments. Pt reports [employer] is the resource she has used in the past, but they declined to bring her on 11/5 as she forgot to give them the time of her appt. CSW educated Pt that we could get a print out of her appointments on that day and try to get them to assist for those appt. Pt reports she does not have family in town to assist on a regular basis. Pt agrees to seek assistance through [employer] and seek out CSW next week. Pt denies mobility concerns, so may not qualify for SCAT currently.     Doreen Salvage, LCSW Clinical Social Worker [facility] Wednesday, Thursday and Friday Phone: [phone] Fax: [phone]

## 2013-04-08 ENCOUNTER — Encounter: Payer: Self-pay | Admitting: Radiation Oncology

## 2013-04-08 ENCOUNTER — Ambulatory Visit
Admission: RE | Admit: 2013-04-08 | Discharge: 2013-04-08 | Disposition: A | Discharge status: Home / Self Care | Payer: Medicare Other | Source: Ambulatory Visit | Attending: Radiation Oncology | Admitting: Radiation Oncology

## 2013-04-08 NOTE — Progress Notes (Signed)
  Radiation Oncology         [phone] ________________________________   Name: [name]  MRN: [id number]  Date: 04/08/2013  DOB: 01/21/1935  Follow-Up Visit Note  CC: Pearson Grippe, MD  Alleen Borne, MD  Diagnosis:   78 yo woman with Stage IB squamous cell carcinoma of the left lower lung s/p curative, definitive stereotactic body radiotherapy to 50 Gy in 5 fractions through 02/27/2013   Interval Since Last Radiation:  4  weeks  Narrative:  The patient returns today for routine follow-up.  Reports a productive cough with clear sputum that is worse at night. Denies painful or difficulty swallowing. Reports decreased appetite. Weight stable. Patient states, "I have gained two pounds since last week." Patient reports that she had her flu shot a few weeks ago. Patient denies chest pain. Reports skin of her chest and upper back are of normal color and appearance. Patient has no complaints at this time                             ALLERGIES:  is allergic to iohexol.  Meds: Current Outpatient Prescriptions  Medication Sig Dispense Refill  . COMBIVENT RESPIMAT 20-100 MCG/ACT AERS respimat Inhale into the lungs 4 (four) times daily as needed.      . diltiazem (DILACOR XR) 120 MG 24 hr capsule       . FLUZONE HIGH-DOSE injection       . OVER THE COUNTER MEDICATION Take 1 capsule by mouth daily. Calcium carbonate 333 mg, magnesium oxide 133 mg and zinc sulfate 5 mg       No current facility-administered medications for this encounter.    Physical Findings: The patient is in no acute distress. Patient is alert and oriented.  weight is 97 lb 8 oz (44.226 kg). Her oral temperature is 97.6 F (36.4 C). Her blood pressure is 146/69 and her pulse is 102. Her respiration is 18 and oxygen saturation is 94%. .  No significant changes.  Impression:  The patient is recovering from the effects of radiation  Plan:  Repeat chest CT for re-staging post-SBRT in early December.  Patient will continue to  use Robitussin DM for cough and calll [place] if that worsens.  _____________________________________  Artist Pais Kathrynn Running, M.D.

## 2013-04-08 NOTE — Progress Notes (Signed)
Reports a productive cough with clear sputum that is worse at night. Denies painful or difficulty swallowing. Reports decreased appetite. Weight stable. Patient states, "I have gained two pounds since last week." Patient reports that she had her flu shot a few weeks ago. Patient denies chest pain. Reports skin of her chest and upper back are of normal color and appearance. Patient has no complaints at this time.

## 2013-04-24 ENCOUNTER — Telehealth: Payer: Self-pay | Admitting: *Deleted

## 2013-04-24 NOTE — Telephone Encounter (Signed)
RETURNED PATIENT'S PHONE CALL, SPOKE WITH PATIENT. ?

## 2013-05-13 ENCOUNTER — Telehealth: Payer: Self-pay | Admitting: Internal Medicine

## 2013-05-13 NOTE — Telephone Encounter (Signed)
lvm for pt regarding to Jan 2015 time change...mailed pt appt sched and letter

## 2013-06-05 ENCOUNTER — Other Ambulatory Visit (HOSPITAL_BASED_OUTPATIENT_CLINIC_OR_DEPARTMENT_OTHER): Payer: Medicare Other

## 2013-06-05 ENCOUNTER — Encounter (HOSPITAL_COMMUNITY): Payer: Self-pay

## 2013-06-05 ENCOUNTER — Other Ambulatory Visit: Payer: Self-pay | Admitting: Internal Medicine

## 2013-06-05 ENCOUNTER — Ambulatory Visit (HOSPITAL_COMMUNITY)
Admission: RE | Admit: 2013-06-05 | Discharge: 2013-06-05 | Disposition: A | Discharge status: Home / Self Care | Payer: Medicare Other | Source: Ambulatory Visit | Attending: Internal Medicine | Admitting: Internal Medicine

## 2013-06-05 DIAGNOSIS — J479 Bronchiectasis, uncomplicated: Secondary | ICD-10-CM | POA: Insufficient documentation

## 2013-06-05 DIAGNOSIS — C3490 Malignant neoplasm of unspecified part of unspecified bronchus or lung: Secondary | ICD-10-CM | POA: Insufficient documentation

## 2013-06-05 DIAGNOSIS — C3430 Malignant neoplasm of lower lobe, unspecified bronchus or lung: Secondary | ICD-10-CM

## 2013-06-05 DIAGNOSIS — J438 Other emphysema: Secondary | ICD-10-CM | POA: Insufficient documentation

## 2013-06-05 DIAGNOSIS — I700 Atherosclerosis of aorta: Secondary | ICD-10-CM | POA: Insufficient documentation

## 2013-06-05 DIAGNOSIS — I2510 Atherosclerotic heart disease of native coronary artery without angina pectoris: Secondary | ICD-10-CM | POA: Insufficient documentation

## 2013-06-05 DIAGNOSIS — Z923 Personal history of irradiation: Secondary | ICD-10-CM | POA: Insufficient documentation

## 2013-06-05 DIAGNOSIS — K7689 Other specified diseases of liver: Secondary | ICD-10-CM | POA: Insufficient documentation

## 2013-06-05 LAB — CBC WITH DIFFERENTIAL/PLATELET
BASO%: 0.3 % (ref 0.0–2.0)
BASOS ABS: 0 10*3/uL (ref 0.0–0.1)
EOS ABS: 0 10*3/uL (ref 0.0–0.5)
EOS%: 0.5 % (ref 0.0–7.0)
HCT: 39.5 % (ref 34.8–46.6)
HEMOGLOBIN: 13.3 g/dL (ref 11.6–15.9)
LYMPH%: 11.2 % — AB (ref 14.0–49.7)
MCH: 32.1 pg (ref 25.1–34.0)
MCHC: 33.6 g/dL (ref 31.5–36.0)
MCV: 95.7 fL (ref 79.5–101.0)
MONO#: 0.7 10*3/uL (ref 0.1–0.9)
MONO%: 8 % (ref 0.0–14.0)
NEUT%: 80 % — ABNORMAL HIGH (ref 38.4–76.8)
NEUTROS ABS: 7.3 10*3/uL — AB (ref 1.5–6.5)
PLATELETS: 326 10*3/uL (ref 145–400)
RBC: 4.13 10*6/uL (ref 3.70–5.45)
RDW: 16.2 % — ABNORMAL HIGH (ref 11.2–14.5)
WBC: 9.1 10*3/uL (ref 3.9–10.3)
lymph#: 1 10*3/uL (ref 0.9–3.3)

## 2013-06-05 LAB — COMPREHENSIVE METABOLIC PANEL (CC13)
ALK PHOS: 94 U/L (ref 40–150)
ALT: 11 U/L (ref 0–55)
AST: 15 U/L (ref 5–34)
Albumin: 3.7 g/dL (ref 3.5–5.0)
Anion Gap: 11 mEq/L (ref 3–11)
BILIRUBIN TOTAL: 0.48 mg/dL (ref 0.20–1.20)
BUN: 9.1 mg/dL (ref 7.0–26.0)
CO2: 28 mEq/L (ref 22–29)
Calcium: 9.8 mg/dL (ref 8.4–10.4)
Chloride: 101 mEq/L (ref 98–109)
Creatinine: 0.7 mg/dL (ref 0.6–1.1)
GLUCOSE: 113 mg/dL (ref 70–140)
Potassium: 4.2 mEq/L (ref 3.5–5.1)
Sodium: 140 mEq/L (ref 136–145)
Total Protein: 7.9 g/dL (ref 6.4–8.3)

## 2013-06-09 ENCOUNTER — Telehealth: Payer: Self-pay | Admitting: Internal Medicine

## 2013-06-09 ENCOUNTER — Encounter: Payer: Self-pay | Admitting: Internal Medicine

## 2013-06-09 ENCOUNTER — Ambulatory Visit (HOSPITAL_BASED_OUTPATIENT_CLINIC_OR_DEPARTMENT_OTHER): Payer: Medicare Other | Admitting: Internal Medicine

## 2013-06-09 ENCOUNTER — Other Ambulatory Visit: Payer: Self-pay | Admitting: *Deleted

## 2013-06-09 DIAGNOSIS — C3430 Malignant neoplasm of lower lobe, unspecified bronchus or lung: Secondary | ICD-10-CM

## 2013-06-09 NOTE — Telephone Encounter (Signed)
lvm for pt regarding to May 2015 appts
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
## 2013-06-09 NOTE — Patient Instructions (Signed)
Smoking Cessation Quitting smoking is important to your health and has many advantages. However, it is not always easy to quit since nicotine is a very addictive drug. Often times, people try 3 times or more before being able to quit. This document explains the best ways for you to prepare to quit smoking. Quitting takes hard work and a lot of effort, but you can do it. ADVANTAGES OF QUITTING SMOKING  You will live longer, feel better, and live better.  Your body will feel the impact of quitting smoking almost immediately.  Within 20 minutes, blood pressure decreases. Your pulse returns to its normal level.  After 8 hours, carbon monoxide levels in the blood return to normal. Your oxygen level increases.  After 24 hours, the chance of having a heart attack starts to decrease. Your breath, hair, and body stop smelling like smoke.  After 48 hours, damaged nerve endings begin to recover. Your sense of taste and smell improve.  After 72 hours, the body is virtually free of nicotine. Your bronchial tubes relax and breathing becomes easier.  After 2 to 12 weeks, lungs can hold more air. Exercise becomes easier and circulation improves.  The risk of having a heart attack, stroke, cancer, or lung disease is greatly reduced.  After 1 year, the risk of coronary heart disease is cut in half.  After 5 years, the risk of stroke falls to the same as a nonsmoker.  After 10 years, the risk of lung cancer is cut in half and the risk of other cancers decreases significantly.  After 15 years, the risk of coronary heart disease drops, usually to the level of a nonsmoker.  If you are pregnant, quitting smoking will improve your chances of having a healthy baby.  The people you live with, especially any children, will be healthier.  You will have extra money to spend on things other than cigarettes. QUESTIONS TO THINK ABOUT BEFORE ATTEMPTING TO QUIT You may want to talk about your answers with your  caregiver.  Why do you want to quit?  If you tried to quit in the past, what helped and what did not?  What will be the most difficult situations for you after you quit? How will you plan to handle them?  Who can help you through the tough times? Your family? Friends? A caregiver?  What pleasures do you get from smoking? What ways can you still get pleasure if you quit? Here are some questions to ask your caregiver:  How can you help me to be successful at quitting?  What medicine do you think would be best for me and how should I take it?  What should I do if I need more help?  What is smoking withdrawal like? How can I get information on withdrawal? GET READY  Set a quit date.  Change your environment by getting rid of all cigarettes, ashtrays, matches, and lighters in your home, car, or work. Do not let people smoke in your home.  Review your past attempts to quit. Think about what worked and what did not. GET SUPPORT AND ENCOURAGEMENT You have a better chance of being successful if you have help. You can get support in many ways.  Tell your family, friends, and co-workers that you are going to quit and need their support. Ask them not to smoke around you.  Get individual, group, or telephone counseling and support. Programs are available at local hospitals and health centers. Call your local health department for   information about programs in your area.  Spiritual beliefs and practices may help some smokers quit.  Download a "quit meter" on your computer to keep track of quit statistics, such as how long you have gone without smoking, cigarettes not smoked, and money saved.  Get a self-help book about quitting smoking and staying off of tobacco. LEARN NEW SKILLS AND BEHAVIORS  Distract yourself from urges to smoke. Talk to someone, go for a walk, or occupy your time with a task.  Change your normal routine. Take a different route to work. Drink tea instead of coffee.  Eat breakfast in a different place.  Reduce your stress. Take a hot bath, exercise, or read a book.  Plan something enjoyable to do every day. Reward yourself for not smoking.  Explore interactive web-based programs that specialize in helping you quit. GET MEDICINE AND USE IT CORRECTLY Medicines can help you stop smoking and decrease the urge to smoke. Combining medicine with the above behavioral methods and support can greatly increase your chances of successfully quitting smoking.  Nicotine replacement therapy helps deliver nicotine to your body without the negative effects and risks of smoking. Nicotine replacement therapy includes nicotine gum, lozenges, inhalers, nasal sprays, and skin patches. Some may be available over-the-counter and others require a prescription.  Antidepressant medicine helps people abstain from smoking, but how this works is unknown. This medicine is available by prescription.  Nicotinic receptor partial agonist medicine simulates the effect of nicotine in your brain. This medicine is available by prescription. Ask your caregiver for advice about which medicines to use and how to use them based on your health history. Your caregiver will tell you what side effects to look out for if you choose to be on a medicine or therapy. Carefully read the information on the package. Do not use any other product containing nicotine while using a nicotine replacement product.  RELAPSE OR DIFFICULT SITUATIONS Most relapses occur within the first 3 months after quitting. Do not be discouraged if you start smoking again. Remember, most people try several times before finally quitting. You may have symptoms of withdrawal because your body is used to nicotine. You may crave cigarettes, be irritable, feel very hungry, cough often, get headaches, or have difficulty concentrating. The withdrawal symptoms are only temporary. They are strongest when you first quit, but they will go away within  10 14 days. To reduce the chances of relapse, try to:  Avoid drinking alcohol. Drinking lowers your chances of successfully quitting.  Reduce the amount of caffeine you consume. Once you quit smoking, the amount of caffeine in your body increases and can give you symptoms, such as a rapid heartbeat, sweating, and anxiety.  Avoid smokers because they can make you want to smoke.  Do not let weight gain distract you. Many smokers will gain weight when they quit, usually less than 10 pounds. Eat a healthy diet and stay active. You can always lose the weight gained after you quit.  Find ways to improve your mood other than smoking. FOR MORE INFORMATION  www.smokefree.gov  Document Released: 05/15/2001 Document Revised: 11/20/2011 Document Reviewed: 08/30/2011 [employer] Patient Information 2014 [employer].  

## 2013-08-19 ENCOUNTER — Encounter: Payer: Self-pay | Admitting: Podiatrist

## 2013-08-19 ENCOUNTER — Ambulatory Visit (INDEPENDENT_AMBULATORY_CARE_PROVIDER_SITE_OTHER): Payer: Medicare Other

## 2013-08-19 ENCOUNTER — Ambulatory Visit (INDEPENDENT_AMBULATORY_CARE_PROVIDER_SITE_OTHER): Payer: Medicare Other | Admitting: Podiatrist

## 2013-08-19 VITALS — BP 144/86 | HR 80 | Resp 12

## 2013-08-19 DIAGNOSIS — B351 Tinea unguium: Secondary | ICD-10-CM

## 2013-08-19 DIAGNOSIS — M79609 Pain in unspecified limb: Secondary | ICD-10-CM

## 2013-08-19 DIAGNOSIS — M2040 Other hammer toe(s) (acquired), unspecified foot: Secondary | ICD-10-CM

## 2013-08-19 DIAGNOSIS — M79673 Pain in unspecified foot: Secondary | ICD-10-CM

## 2013-08-19 NOTE — Patient Instructions (Addendum)
Pre-Operative Instructions  Congratulations, you have decided to take an important step to improving your quality of life.  You can be assured that the doctors of [facility] will be with you every step of the way.  1. Plan to be at the surgery center/hospital at least 1 (one) hour prior to your scheduled time unless otherwise directed by the surgical center/hospital staff.  You must have a responsible adult accompany you, remain during the surgery and drive you home.  Make sure you have directions to the surgical center/hospital and know how to get there on time. 2. For hospital based surgery you will need to obtain a history and physical form from your family physician within 1 month prior to the date of surgery- we will give you a form for you primary physician.  3. We make every effort to accommodate the date you request for surgery.  There are however, times where surgery dates or times have to be moved.  We will contact you as soon as possible if a change in schedule is required.   4. No Aspirin/Ibuprofen for one week before surgery.  If you are on aspirin, any non-steroidal anti-inflammatory medications (Mobic, Aleve, Ibuprofen) you should stop taking it 7 days prior to your surgery.  You make take Tylenol  For pain prior to surgery.  5. Medications- If you are taking daily heart and blood pressure medications, seizure, reflux, allergy, asthma, anxiety, pain or diabetes medications, make sure the surgery center/hospital is aware before the day of surgery so they may notify you which medications to take or avoid the day of surgery. 6. No food or drink after midnight the night before surgery unless directed otherwise by surgical center/hospital staff. 7. No alcoholic beverages 24 hours prior to surgery.  No smoking 24 hours prior to or 24 hours after surgery. 8. Wear loose pants or shorts- loose enough to fit over bandages, boots, and casts. 9. No slip on shoes, sneakers are best. 10. Bring  your boot with you to the surgery center/hospital.  Also bring crutches or a walker if your physician has prescribed it for you.  If you do not have this equipment, it will be provided for you after surgery. 11. If you have not been contracted by the surgery center/hospital by the day before your surgery, call to confirm the date and time of your surgery. 12. Leave-time from work may vary depending on the type of surgery you have.  Appropriate arrangements should be made prior to surgery with your employer. 13. Prescriptions will be provided immediately following surgery by your doctor.  Have these filled as soon as possible after surgery and take the medication as directed. 14. Remove nail polish on the operative foot. 15. Wash the night before surgery.  The night before surgery wash the foot and leg well with the antibacterial soap provided and water paying special attention to beneath the toenails and in between the toes.  Rinse thoroughly with water and dry well with a towel.  Perform this wash unless told not to do so by your physician.  Enclosed: 1 Ice pack (please put in freezer the night before surgery)   1 Hibiclens skin cleaner   Pre-op Instructions  If you have any questions regarding the instructions, do not hesitate to call our office.  [place]: [address] [place], [place] [address] [phone]  [place]: [address] [place], [place] [address] [phone]  [place]: [address]  [place], [place] [address] [phone]  Dr. Richard   Tuchman DPM, Dr. Ila Mcgill DPM Dr. Harriet Masson DPM, Dr. Lanelle Bal DPM, Dr. Trudie Buckler [facility] Instructions  Congratulations, you have decided to take an important step to improving your quality of life.  You can be assured that the doctors of [facility] will be with you every step of the way.  16. Plan to be at the surgery center/hospital at least 1 (one) hour prior to your scheduled time unless otherwise directed  by the surgical center/hospital staff.  You must have a responsible adult accompany you, remain during the surgery and drive you home.  Make sure you have directions to the surgical center/hospital and know how to get there on time. 18. For hospital based surgery you will need to obtain a history and physical form from your family physician within 1 month prior to the date of surgery- we will give you a form for you primary physician.  18. We make every effort to accommodate the date you request for surgery.  There are however, times where surgery dates or times have to be moved.  We will contact you as soon as possible if a change in schedule is required.   19. No Aspirin/Ibuprofen for one week before surgery.  If you are on aspirin, any non-steroidal anti-inflammatory medications (Mobic, Aleve, Ibuprofen) you should stop taking it 7 days prior to your surgery.  You make take Tylenol  For pain prior to surgery.  20. Medications- If you are taking daily heart and blood pressure medications, seizure, reflux, allergy, asthma, anxiety, pain or diabetes medications, make sure the surgery center/hospital is aware before the day of surgery so they may notify you which medications to take or avoid the day of surgery. 21. No food or drink after midnight the night before surgery unless directed otherwise by surgical center/hospital staff. 22. No alcoholic beverages 24 hours prior to surgery.  No smoking 24 hours prior to or 24 hours after surgery. 23. Wear loose pants or shorts- loose enough to fit over bandages, boots, and casts. 24. No slip on shoes, sneakers are best. 25. Bring your boot with you to the surgery center/hospital.  Also bring crutches or a walker if your physician has prescribed it for you.  If you do not have this equipment, it will be provided for you after surgery. 53. If you have not been contracted by the surgery center/hospital by the day before your surgery, call to confirm the date and time  of your surgery. 57. Leave-time from work may vary depending on the type of surgery you have.  Appropriate arrangements should be made prior to surgery with your employer. 28. Prescriptions will be provided immediately following surgery by your doctor.  Have these filled as soon as possible after surgery and take the medication as directed. 29. Remove nail polish on the operative foot. 40. Wash the night before surgery.  The night before surgery wash the foot and leg well with the antibacterial soap provided and water paying special attention to beneath the toenails and in between the toes.  Rinse thoroughly with water and dry well with a towel.  Perform this wash unless told not to do so by your physician.  Enclosed: 1 Ice pack (please put in freezer the night before surgery)   1 Hibiclens skin cleaner   Pre-op Instructions  If you have any questions regarding the instructions, do not hesitate to call our office.  [place]: [place], [place] [address] [place]: [phone]  [address] [place], [place] [address] [phone]  [place]: [address]  [place], [place] [address] [phone]  Dr. Kendell Bane DPM, Dr. Ila Mcgill DPM Dr. Harriet Masson DPM, Dr. Lanelle Bal DPM, Dr. Trudie Buckler DPM

## 2013-08-19 NOTE — Progress Notes (Signed)
   Chief Complaint  Patient presents with  . Nail Problem    '' TOENAILS TRIM.''     HPI: Patient is 79 y.o. female who presents today for two problems.  First her hammertoes pain and contracture which are crossing over on her right foot and causing a significant amount of discomfort.  Secondly,  She has thickened, elongated and discolored toenails which are painful and symptomatic especially in shoes. The patient presents today with her granddaughter who is concerned about the pain that Shawana regularly is in.  She states that any shoes are uncomfortable and she presents today wearing a good pair of supportive tennis shoes.   Physical Exam GENERAL APPEARANCE: Alert, conversant. Appropriately groomed. No acute distress.  VASCULAR: Pedal pulses palpable at 2/4 DP and PT bilateral.  Capillary refill time is immediate to all digits,  Proximal to distal cooling it warm to warm.  Digital hair growth is present bilateral  NEUROLOGIC: sensation is intact epicritically and protectively to 5.07 monofilament at 5/5 sites bilateral.  Light touch is intact bilateral, vibratory sensation intact bilateral, achilles tendon reflex is intact bilateral.  MUSCULOSKELETAL: Hammertoe contracture is present digits 2, 3, 4, 5 of the right foot at the proximal interphalangeal joint and distal interphalangeal joint right. The left foot has had a previous hammertoe surgery on the second toe and it is in a straightened position but medially oriented. The left foot causes minimal discomfort in comparison with the right.  DERMATOLOGIC: skin color, texture, and turger are within normal limits.  Patient's toenails are thickened, discolored, dystrophic, clinically mycotic and uncomfortable with pressure and shoe gear.  Assessment: Hammertoe deformity digits 2, 3, 4, 5 right foot and symptomatic mycotic toenails  Plan: Discussed conservative versus surgical options. Recommended a arthroplasty with temporary pin fixation of digits  2,3,4,5 of the right foot. The consent form was discussed and all three pages were signed and the patient's questions were encouraged and answered to the best of my ability. Risks of the surgery were discussed including but not limited to continued pain, infection, swelling, elevated toe, decreased range of motion,  Or suture or implant reaction. Preoperative instructions were also dispensed to the patient as well as a preoperative surgical pamphlet to go along with the instructions. Surgery will be scheduled at the patients convenience and patient will be seen at [facility] specialty surgery center on outpatient basis. If  any questions or concerns prior to her surgical date She is instructed to call. The patient's toenails x10 are also debrided today without complication. I will see her back one week postoperatively if she has any concerns prior that visit she will call.

## 2013-08-26 DIAGNOSIS — M2040 Other hammer toe(s) (acquired), unspecified foot: Secondary | ICD-10-CM

## 2013-09-07 ENCOUNTER — Telehealth: Payer: Self-pay | Admitting: *Deleted

## 2013-09-07 NOTE — Telephone Encounter (Signed)
Message copied by Lolita Rieger on Mon Sep 07, 2013 10:06 AM ------      Message from: Laury Axon      Created: Mon Aug 31, 2013 10:08 AM      Regarding: stress test/sch surgery      Contact: [phone]       pts daughter angela called. wants to r/s surgery since she had a stress test and everything came back ok. Please call angela with info. ------

## 2013-09-07 NOTE — Telephone Encounter (Signed)
I called and spoke to [name].  We're going to schedule her mother's surgery on 09/16/13.  She asked the time.  I informed her the surgical center will call her a day or 2 ahead of time and let her know the arrival time.  She asked if the location was on [address].  I informed her it's on [address]

## 2013-09-15 ENCOUNTER — Telehealth: Payer: Self-pay | Admitting: *Deleted

## 2013-09-15 NOTE — Telephone Encounter (Signed)
Patient told [name] she was required to have a stress test.  She's a poor historian.  We're not sure why she had to have it.  I called and spoke to patient's daughter, [name].  She stated patient started seeing a new doctor and she required her to have it done before she had surgery.  Everything came back normal.  I called and left a message with Renee at the surgical center to give this response to [facility].

## 2013-09-15 NOTE — Telephone Encounter (Signed)
Someone just tried to call me.  I couldn't get to the phone fast enough.  I returned her call.  She said someone had already called from the surgical center and told her what time to be there.

## 2013-09-15 NOTE — Telephone Encounter (Signed)
Calling to see about when I'm going to be operated on Wednesday.  I called and informed her that the surgical center should call her today and let her know the time.  I also informed her she is welcome to call them.  I provided her with [facility]'s phone number.

## 2013-09-23 ENCOUNTER — Ambulatory Visit (INDEPENDENT_AMBULATORY_CARE_PROVIDER_SITE_OTHER): Payer: Self-pay | Admitting: Podiatrist

## 2013-09-23 ENCOUNTER — Encounter: Payer: Self-pay | Admitting: Podiatrist

## 2013-09-23 ENCOUNTER — Ambulatory Visit (INDEPENDENT_AMBULATORY_CARE_PROVIDER_SITE_OTHER): Payer: Medicare Other

## 2013-09-23 VITALS — BP 132/63 | HR 90 | Resp 16

## 2013-09-23 DIAGNOSIS — M2040 Other hammer toe(s) (acquired), unspecified foot: Secondary | ICD-10-CM

## 2013-09-23 MED ORDER — OXYCODONE-ACETAMINOPHEN 5-325 MG PO TABS: 1.0000 | ORAL_TABLET | ORAL | Status: DC | PRN

## 2013-09-23 NOTE — Progress Notes (Signed)
Subjective: Patient presents today1 week status post foot surgery of the right foot.  Date of surgery 09-16-13. Patient denies nausea, vomiting, fevers, chills or night sweats.  Denies calf pain or tenderness to the operative side. She had hammertoe repair with pin fixation digits 2,3,4 right and arthroplasty of the 5th right foot  Objective:  Neurovascular status is intact with palpable pedal pulses DP and PT bilateral at 2+ out of 4. Neurological sensation is intact and unchanged as per prior to surgery. Excellent appearance of the postoperative foot is noted.   Assessment: Status post hammertoe fusion surgery and correction right foot toes 2,3,4,5  Plan:  Redressed the foot in a dry sterile dressing.  She is to continue wearing her darco shoe.  I will see her back in 1 week for suture removal.

## 2013-09-23 NOTE — Patient Instructions (Signed)
Keep your foot dry for the next week --- I will see you next week and we will remove your sutures.

## 2013-10-02 ENCOUNTER — Encounter: Payer: Self-pay | Admitting: Podiatrist

## 2013-10-02 ENCOUNTER — Ambulatory Visit (INDEPENDENT_AMBULATORY_CARE_PROVIDER_SITE_OTHER): Payer: Self-pay | Admitting: Podiatrist

## 2013-10-02 DIAGNOSIS — M79609 Pain in unspecified limb: Secondary | ICD-10-CM

## 2013-10-02 DIAGNOSIS — Z9889 Other specified postprocedural states: Secondary | ICD-10-CM

## 2013-10-02 DIAGNOSIS — M2040 Other hammer toe(s) (acquired), unspecified foot: Secondary | ICD-10-CM

## 2013-10-02 DIAGNOSIS — M79673 Pain in unspecified foot: Secondary | ICD-10-CM

## 2013-10-02 MED ORDER — HYDROCODONE-ACETAMINOPHEN 5-325 MG PO TABS: 1.0000 | ORAL_TABLET | Freq: Four times a day (QID) | ORAL | Status: DC | PRN

## 2013-10-02 NOTE — Progress Notes (Signed)
My right foot hurts a little bit and a pain on top of it and my surgery was 09/16/13 and the prescription I got this time was not as good as the first ones I had  Subjective: Patient presents today 2 week status post foot surgery of the right foot. Date of surgery 09-16-13. Patient denies nausea, vomiting, fevers, chills or night sweats. Denies calf pain or tenderness to the operative side. She had hammertoe repair with pin fixation digits 2,3,4 right and arthroplasty of the 5th right foot   Objective: Neurovascular status is intact with palpable pedal pulses DP and PT bilateral at 2+ out of 4. Neurological sensation is intact and unchanged as per prior to surgery. Excellent appearance of the postoperative foot is noted.   Assessment: Status post hammertoe fusion surgery and correction right foot toes 2,3,4,5   Plan: removed sutures and Redressed the foot in a dry sterile dressing. She is to continue wearing her darco shoe.  i will see her back in 2 weeks and we will remove the pins at that time if ready.  If she has any problems prior to that visit she will call.  Refill of pain medication given today.

## 2013-10-05 ENCOUNTER — Other Ambulatory Visit (HOSPITAL_BASED_OUTPATIENT_CLINIC_OR_DEPARTMENT_OTHER): Payer: Medicare Other

## 2013-10-05 ENCOUNTER — Ambulatory Visit (HOSPITAL_COMMUNITY)
Admission: RE | Admit: 2013-10-05 | Discharge: 2013-10-05 | Disposition: A | Discharge status: Home / Self Care | Payer: Medicare Other | Source: Ambulatory Visit | Attending: Internal Medicine | Admitting: Internal Medicine

## 2013-10-05 DIAGNOSIS — C3430 Malignant neoplasm of lower lobe, unspecified bronchus or lung: Secondary | ICD-10-CM

## 2013-10-05 DIAGNOSIS — J438 Other emphysema: Secondary | ICD-10-CM | POA: Insufficient documentation

## 2013-10-05 LAB — CBC WITH DIFFERENTIAL/PLATELET
BASO%: 0.4 % (ref 0.0–2.0)
Basophils Absolute: 0 10*3/uL (ref 0.0–0.1)
EOS%: 0.7 % (ref 0.0–7.0)
Eosinophils Absolute: 0.1 10*3/uL (ref 0.0–0.5)
HCT: 40.1 % (ref 34.8–46.6)
HEMOGLOBIN: 13.5 g/dL (ref 11.6–15.9)
LYMPH%: 13.7 % — ABNORMAL LOW (ref 14.0–49.7)
MCH: 32.5 pg (ref 25.1–34.0)
MCHC: 33.5 g/dL (ref 31.5–36.0)
MCV: 96.8 fL (ref 79.5–101.0)
MONO#: 0.8 10*3/uL (ref 0.1–0.9)
MONO%: 7.9 % (ref 0.0–14.0)
NEUT#: 8.2 10*3/uL — ABNORMAL HIGH (ref 1.5–6.5)
NEUT%: 77.3 % — ABNORMAL HIGH (ref 38.4–76.8)
Platelets: 399 10*3/uL (ref 145–400)
RBC: 4.14 10*6/uL (ref 3.70–5.45)
RDW: 16.8 % — AB (ref 11.2–14.5)
WBC: 10.6 10*3/uL — ABNORMAL HIGH (ref 3.9–10.3)
lymph#: 1.5 10*3/uL (ref 0.9–3.3)

## 2013-10-05 LAB — COMPREHENSIVE METABOLIC PANEL (CC13)
ALT: 9 U/L (ref 0–55)
ANION GAP: 9 meq/L (ref 3–11)
AST: 15 U/L (ref 5–34)
Albumin: 3.9 g/dL (ref 3.5–5.0)
Alkaline Phosphatase: 76 U/L (ref 40–150)
BUN: 10.6 mg/dL (ref 7.0–26.0)
CHLORIDE: 100 meq/L (ref 98–109)
CO2: 29 meq/L (ref 22–29)
CREATININE: 0.7 mg/dL (ref 0.6–1.1)
Calcium: 10.5 mg/dL — ABNORMAL HIGH (ref 8.4–10.4)
Glucose: 123 mg/dl (ref 70–140)
Potassium: 4.5 mEq/L (ref 3.5–5.1)
Sodium: 138 mEq/L (ref 136–145)
TOTAL PROTEIN: 7.9 g/dL (ref 6.4–8.3)
Total Bilirubin: 0.57 mg/dL (ref 0.20–1.20)

## 2013-10-12 ENCOUNTER — Encounter: Payer: Self-pay | Admitting: Internal Medicine

## 2013-10-12 ENCOUNTER — Telehealth: Payer: Self-pay | Admitting: Internal Medicine

## 2013-10-12 ENCOUNTER — Ambulatory Visit (HOSPITAL_BASED_OUTPATIENT_CLINIC_OR_DEPARTMENT_OTHER): Payer: Medicare Other | Admitting: Internal Medicine

## 2013-10-12 VITALS — BP 151/56 | HR 76 | Temp 97.8°F | Resp 17 | Ht 62.0 in | Wt 97.0 lb

## 2013-10-12 DIAGNOSIS — I10 Essential (primary) hypertension: Secondary | ICD-10-CM

## 2013-10-12 DIAGNOSIS — J449 Chronic obstructive pulmonary disease, unspecified: Secondary | ICD-10-CM

## 2013-10-12 DIAGNOSIS — R05 Cough: Secondary | ICD-10-CM

## 2013-10-12 DIAGNOSIS — C3430 Malignant neoplasm of lower lobe, unspecified bronchus or lung: Secondary | ICD-10-CM

## 2013-10-12 DIAGNOSIS — R059 Cough, unspecified: Secondary | ICD-10-CM

## 2013-10-12 DIAGNOSIS — R0602 Shortness of breath: Secondary | ICD-10-CM

## 2013-10-12 NOTE — Patient Instructions (Signed)
Smoking Cessation Quitting smoking is important to your health and has many advantages. However, it is not always easy to quit since nicotine is a very addictive drug. Often times, people try 3 times or more before being able to quit. This document explains the best ways for you to prepare to quit smoking. Quitting takes hard work and a lot of effort, but you can do it. ADVANTAGES OF QUITTING SMOKING  You will live longer, feel better, and live better.  Your body will feel the impact of quitting smoking almost immediately.  Within 20 minutes, blood pressure decreases. Your pulse returns to its normal level.  After 8 hours, carbon monoxide levels in the blood return to normal. Your oxygen level increases.  After 24 hours, the chance of having a heart attack starts to decrease. Your breath, hair, and body stop smelling like smoke.  After 48 hours, damaged nerve endings begin to recover. Your sense of taste and smell improve.  After 72 hours, the body is virtually free of nicotine. Your bronchial tubes relax and breathing becomes easier.  After 2 to 12 weeks, lungs can hold more air. Exercise becomes easier and circulation improves.  The risk of having a heart attack, stroke, cancer, or lung disease is greatly reduced.  After 1 year, the risk of coronary heart disease is cut in half.  After 5 years, the risk of stroke falls to the same as a nonsmoker.  After 10 years, the risk of lung cancer is cut in half and the risk of other cancers decreases significantly.  After 15 years, the risk of coronary heart disease drops, usually to the level of a nonsmoker.  If you are pregnant, quitting smoking will improve your chances of having a healthy baby.  The people you live with, especially any children, will be healthier.  You will have extra money to spend on things other than cigarettes. QUESTIONS TO THINK ABOUT BEFORE ATTEMPTING TO QUIT You may want to talk about your answers with your  caregiver.  Why do you want to quit?  If you tried to quit in the past, what helped and what did not?  What will be the most difficult situations for you after you quit? How will you plan to handle them?  Who can help you through the tough times? Your family? Friends? A caregiver?  What pleasures do you get from smoking? What ways can you still get pleasure if you quit? Here are some questions to ask your caregiver:  How can you help me to be successful at quitting?  What medicine do you think would be best for me and how should I take it?  What should I do if I need more help?  What is smoking withdrawal like? How can I get information on withdrawal? GET READY  Set a quit date.  Change your environment by getting rid of all cigarettes, ashtrays, matches, and lighters in your home, car, or work. Do not let people smoke in your home.  Review your past attempts to quit. Think about what worked and what did not. GET SUPPORT AND ENCOURAGEMENT You have a better chance of being successful if you have help. You can get support in many ways.  Tell your family, friends, and co-workers that you are going to quit and need their support. Ask them not to smoke around you.  Get individual, group, or telephone counseling and support. Programs are available at local hospitals and health centers. Call your local health department for   information about programs in your area.  Spiritual beliefs and practices may help some smokers quit.  Download a "quit meter" on your computer to keep track of quit statistics, such as how long you have gone without smoking, cigarettes not smoked, and money saved.  Get a self-help book about quitting smoking and staying off of tobacco. LEARN NEW SKILLS AND BEHAVIORS  Distract yourself from urges to smoke. Talk to someone, go for a walk, or occupy your time with a task.  Change your normal routine. Take a different route to work. Drink tea instead of coffee.  Eat breakfast in a different place.  Reduce your stress. Take a hot bath, exercise, or read a book.  Plan something enjoyable to do every day. Reward yourself for not smoking.  Explore interactive web-based programs that specialize in helping you quit. GET MEDICINE AND USE IT CORRECTLY Medicines can help you stop smoking and decrease the urge to smoke. Combining medicine with the above behavioral methods and support can greatly increase your chances of successfully quitting smoking.  Nicotine replacement therapy helps deliver nicotine to your body without the negative effects and risks of smoking. Nicotine replacement therapy includes nicotine gum, lozenges, inhalers, nasal sprays, and skin patches. Some may be available over-the-counter and others require a prescription.  Antidepressant medicine helps people abstain from smoking, but how this works is unknown. This medicine is available by prescription.  Nicotinic receptor partial agonist medicine simulates the effect of nicotine in your brain. This medicine is available by prescription. Ask your caregiver for advice about which medicines to use and how to use them based on your health history. Your caregiver will tell you what side effects to look out for if you choose to be on a medicine or therapy. Carefully read the information on the package. Do not use any other product containing nicotine while using a nicotine replacement product.  RELAPSE OR DIFFICULT SITUATIONS Most relapses occur within the first 3 months after quitting. Do not be discouraged if you start smoking again. Remember, most people try several times before finally quitting. You may have symptoms of withdrawal because your body is used to nicotine. You may crave cigarettes, be irritable, feel very hungry, cough often, get headaches, or have difficulty concentrating. The withdrawal symptoms are only temporary. They are strongest when you first quit, but they will go away within  10 14 days. To reduce the chances of relapse, try to:  Avoid drinking alcohol. Drinking lowers your chances of successfully quitting.  Reduce the amount of caffeine you consume. Once you quit smoking, the amount of caffeine in your body increases and can give you symptoms, such as a rapid heartbeat, sweating, and anxiety.  Avoid smokers because they can make you want to smoke.  Do not let weight gain distract you. Many smokers will gain weight when they quit, usually less than 10 pounds. Eat a healthy diet and stay active. You can always lose the weight gained after you quit.  Find ways to improve your mood other than smoking. FOR MORE INFORMATION  www.smokefree.gov  Document Released: 05/15/2001 Document Revised: 11/20/2011 Document Reviewed: 08/30/2011 [employer] Patient Information 2014 [employer].  

## 2013-10-12 NOTE — Telephone Encounter (Signed)
Gave pt appt for MD only on June 2015
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
## 2013-10-16 ENCOUNTER — Encounter: Payer: Self-pay | Admitting: Podiatrist

## 2013-10-16 ENCOUNTER — Ambulatory Visit (INDEPENDENT_AMBULATORY_CARE_PROVIDER_SITE_OTHER): Payer: Medicare Other | Admitting: Podiatrist

## 2013-10-16 ENCOUNTER — Ambulatory Visit (INDEPENDENT_AMBULATORY_CARE_PROVIDER_SITE_OTHER): Payer: Medicare Other

## 2013-10-16 VITALS — BP 111/60 | HR 91 | Resp 19 | Ht 63.5 in | Wt 97.0 lb

## 2013-10-16 DIAGNOSIS — M2040 Other hammer toe(s) (acquired), unspecified foot: Secondary | ICD-10-CM

## 2013-10-16 DIAGNOSIS — Z9889 Other specified postprocedural states: Secondary | ICD-10-CM

## 2013-10-16 NOTE — Progress Notes (Signed)
Subjective: [name] presents today with her daughter for postoperative recheck of hammertoe repair digits 2, 3, 4 and 5 all of the right foot. She states it's getting better. The pin had to be removed early on the third digit right foot-- the pins are still in place on digits 2 and 4 of the right foot. Overall she states she's doing fairly well but she would like to take a bath  Objective: Neurovascular status is intact with palpable pedal pulses DP and PT bilateral at 2+ out of 4. Neurological sensation is intact and unchanged as per prior to surgery. Excellent appearance of the postoperative foot is noted. The left foot has a contracted hammertoe on the left second digit which is painful and symptomatic. The patient would like to have this fixed as well.  Assessment: Status post hammertoe fusion surgery and correction right foot toes 2,3,4,5 : Hammertoe left second toe  Plan: removed remaining pins and Redressed the foot in a dry sterile dressing. She is to continue wearing her darco shoe. She states she would like the left second toe fixed as well. We did go over the consent form the patient had ample time to ask questions. We will fix her hammertoe on the second digit of the left foot next week at [facility] surgery center on outpatient basis. If any problems or concerns arise prior that visit she is instructed to call.

## 2013-10-20 ENCOUNTER — Telehealth: Payer: Self-pay | Admitting: *Deleted

## 2013-10-20 NOTE — Telephone Encounter (Signed)
We were there on last week.  My mom is supposed to have surgery on tomorrow but we haven't heard anything.  What's the problem?  I told her I would call the surgical center and give her a call back once I speak with them.  I returned her call and informed her we have her scheduled for tomorrow her arrival time will be at 9:30am.

## 2013-10-21 DIAGNOSIS — M21549 Acquired clubfoot, unspecified foot: Secondary | ICD-10-CM

## 2013-10-22 ENCOUNTER — Telehealth: Payer: Self-pay

## 2013-10-22 NOTE — Telephone Encounter (Signed)
Spoke with pt regarding post op status. She stated that she was feeling well. No pain issues, medication is effective in relieving pain. Advised on ice and elevation and advised f/u appt.

## 2013-10-28 ENCOUNTER — Ambulatory Visit (INDEPENDENT_AMBULATORY_CARE_PROVIDER_SITE_OTHER): Payer: Medicare Other

## 2013-10-28 ENCOUNTER — Encounter: Payer: Self-pay | Admitting: Podiatrist

## 2013-10-28 ENCOUNTER — Ambulatory Visit (INDEPENDENT_AMBULATORY_CARE_PROVIDER_SITE_OTHER): Payer: Medicare Other | Admitting: Podiatrist

## 2013-10-28 VITALS — BP 124/70 | HR 83 | Resp 19 | Ht 64.0 in | Wt 98.0 lb

## 2013-10-28 DIAGNOSIS — M2040 Other hammer toe(s) (acquired), unspecified foot: Secondary | ICD-10-CM

## 2013-10-28 MED ORDER — OXYCODONE-ACETAMINOPHEN 5-325 MG PO TABS: 1.0000 | ORAL_TABLET | ORAL | Status: DC | PRN

## 2013-10-30 ENCOUNTER — Ambulatory Visit (HOSPITAL_COMMUNITY)
Admission: RE | Admit: 2013-10-30 | Discharge: 2013-10-30 | Disposition: A | Discharge status: Home / Self Care | Payer: Medicare Other | Source: Ambulatory Visit | Attending: Internal Medicine | Admitting: Internal Medicine

## 2013-10-30 DIAGNOSIS — N1330 Unspecified hydronephrosis: Secondary | ICD-10-CM | POA: Insufficient documentation

## 2013-10-30 DIAGNOSIS — J9819 Other pulmonary collapse: Secondary | ICD-10-CM | POA: Insufficient documentation

## 2013-10-30 DIAGNOSIS — C3490 Malignant neoplasm of unspecified part of unspecified bronchus or lung: Secondary | ICD-10-CM | POA: Insufficient documentation

## 2013-10-30 DIAGNOSIS — N289 Disorder of kidney and ureter, unspecified: Secondary | ICD-10-CM | POA: Insufficient documentation

## 2013-10-30 DIAGNOSIS — C3430 Malignant neoplasm of lower lobe, unspecified bronchus or lung: Secondary | ICD-10-CM

## 2013-10-30 DIAGNOSIS — J438 Other emphysema: Secondary | ICD-10-CM | POA: Insufficient documentation

## 2013-10-30 LAB — GLUCOSE, CAPILLARY: GLUCOSE-CAPILLARY: 142 mg/dL — AB (ref 70–99)

## 2013-10-30 MED ORDER — FLUDEOXYGLUCOSE F - 18 (FDG) INJECTION
6.2000 | Freq: Once | INTRAVENOUS | Status: AC | PRN
  Administered 2013-10-30: 6.2 via INTRAVENOUS

## 2013-11-02 NOTE — Progress Notes (Signed)
Subjective: Patient presents today1 week status post foot surgery of the left foot.  Date of surgery 10/21/2013 whereby a shortening 2nd metatarsal osteotomy was performed to correct a contracted hammertoe left 2nd. Patient denies nausea, vomiting, fevers, chills or night sweats.  Denies calf pain or tenderness to the operative side. Relates the surgery on the left foot is more tender than the recent surgery we did on the right foot.    Objective:  Neurovascular status is intact with palpable pedal pulses DP and PT bilateral at 1+ out of 4. Neurological sensation is intact and unchanged as per prior to surgery. Excellent appearance of the postoperative foot is noted.  Incision site is well coapted.  xrays reveal good alignment and position of the 2nd metatarsal and rectus appearance of the 2nd toe.  No longer contracted as it was prior to surgery  Assessment: Status post 2nd metatarsal shortening osteotomy with screw to correct a hammertoe  Plan:  Redressed the foot in a dry sterile compressive dressing.  Recommended she keep it dry till Sunday then she may remove the dressing and get it wet.  She will stay in her surgical shoe until I see her back in 2 weeks.  If she has any concerns, she is to call

## 2013-11-06 ENCOUNTER — Ambulatory Visit (HOSPITAL_BASED_OUTPATIENT_CLINIC_OR_DEPARTMENT_OTHER): Payer: Medicare Other | Admitting: Internal Medicine

## 2013-11-06 ENCOUNTER — Encounter: Payer: Self-pay | Admitting: Internal Medicine

## 2013-11-06 VITALS — BP 147/52 | HR 94 | Temp 98.2°F | Resp 17 | Ht 64.0 in | Wt 92.7 lb

## 2013-11-06 DIAGNOSIS — C3430 Malignant neoplasm of lower lobe, unspecified bronchus or lung: Secondary | ICD-10-CM

## 2013-11-06 DIAGNOSIS — R634 Abnormal weight loss: Secondary | ICD-10-CM

## 2013-11-06 NOTE — Patient Instructions (Signed)
Smoking Cessation, Tips for Success If you are ready to quit smoking, congratulations! You have chosen to help yourself be healthier. Cigarettes bring nicotine, tar, carbon monoxide, and other irritants into your body. Your lungs, heart, and blood vessels will be able to work better without these poisons. There are many different ways to quit smoking. Nicotine gum, nicotine patches, a nicotine inhaler, or nicotine nasal spray can help with physical craving. Hypnosis, support groups, and medicines help break the habit of smoking. WHAT THINGS CAN I DO TO MAKE QUITTING EASIER?  Here are some tips to help you quit for good:  Pick a date when you will quit smoking completely. Tell all of your friends and family about your plan to quit on that date.  Do not try to slowly cut down on the number of cigarettes you are smoking. Pick a quit date and quit smoking completely starting on that day.  Throw away all cigarettes.   Clean and remove all ashtrays from your home, work, and car.   On a card, write down your reasons for quitting. Carry the card with you and read it when you get the urge to smoke.   Cleanse your body of nicotine. Drink enough water and fluids to keep your urine clear or pale yellow. Do this after quitting to flush the nicotine from your body.   Learn to predict your moods. Do not let a bad situation be your excuse to have a cigarette. Some situations in your life might tempt you into wanting a cigarette.   Never have "just one" cigarette. It leads to wanting another and another. Remind yourself of your decision to quit.   Change habits associated with smoking. If you smoked while driving or when feeling stressed, try other activities to replace smoking. Stand up when drinking your coffee. Brush your teeth after eating. Sit in a different chair when you read the paper. Avoid alcohol while trying to quit, and try to drink fewer caffeinated beverages. Alcohol and caffeine may urge  you to smoke.   Avoid foods and drinks that can trigger a desire to smoke, such as sugary or spicy foods and alcohol.   Ask people who smoke not to smoke around you.   Have something planned to do right after eating or having a cup of coffee. For example, plan to take a walk or exercise.   Try a relaxation exercise to calm you down and decrease your stress. Remember, you may be tense and nervous for the first 2 weeks after you quit, but this will pass.   Find new activities to keep your hands busy. Play with a pen, coin, or rubber band. Doodle or draw things on paper.   Brush your teeth right after eating. This will help cut down on the craving for the taste of tobacco after meals. You can also try mouthwash.   Use oral substitutes in place of cigarettes. Try using lemon drops, carrots, cinnamon sticks, or chewing gum. Keep them handy so they are available when you have the urge to smoke.   When you have the urge to smoke, try deep breathing.   Designate your home as a nonsmoking area.   If you are a heavy smoker, ask your health care provider about a prescription for nicotine chewing gum. It can ease your withdrawal from nicotine.   Reward yourself. Set aside the cigarette money you save and buy yourself something nice.   Look for support from others. Join a support group or   smoking cessation program. Ask someone at home or at work to help you with your plan to quit smoking.   Always ask yourself, "Do I need this cigarette or is this just a reflex?" Tell yourself, "Today, I choose not to smoke," or "I do not want to smoke." You are reminding yourself of your decision to quit.  Do not replace cigarette smoking with electronic cigarettes (commonly called e-cigarettes). The safety of e-cigarettes is unknown, and some may contain harmful chemicals.  If you relapse, do not give up! Plan ahead and think about what you will do the next time you get the urge to smoke.  HOW WILL  I FEEL WHEN I QUIT SMOKING? You may have symptoms of withdrawal because your body is used to nicotine (the addictive substance in cigarettes). You may crave cigarettes, be irritable, feel very hungry, cough often, get headaches, or have difficulty concentrating. The withdrawal symptoms are only temporary. They are strongest when you first quit but will go away within 10 14 days. When withdrawal symptoms occur, stay in control. Think about your reasons for quitting. Remind yourself that these are signs that your body is healing and getting used to being without cigarettes. Remember that withdrawal symptoms are easier to treat than the major diseases that smoking can cause.  Even after the withdrawal is over, expect periodic urges to smoke. However, these cravings are generally short lived and will go away whether you smoke or not. Do not smoke!  WHAT RESOURCES ARE AVAILABLE TO HELP ME QUIT SMOKING? Your health care provider can direct you to community resources or hospitals for support, which may include:  Group support.  Education.  Hypnosis.  Therapy. Document Released: 02/17/2004 Document Revised: 03/11/2013 Document Reviewed: 11/06/2012 [employer] Patient Information 2014 [employer].  
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
## 2013-11-09 ENCOUNTER — Telehealth: Payer: Self-pay | Admitting: Internal Medicine

## 2013-11-09 NOTE — Telephone Encounter (Signed)
s.w.l pt and advised on June and Sept appt....pt ok and aware

## 2013-11-10 ENCOUNTER — Other Ambulatory Visit: Payer: Medicare Other

## 2013-11-10 ENCOUNTER — Encounter: Payer: Self-pay | Admitting: Radiation Oncology

## 2013-11-10 ENCOUNTER — Ambulatory Visit: Payer: Medicare Other | Admitting: Oncology

## 2013-11-10 NOTE — Progress Notes (Signed)
Thoracic Location of Tumor / Histology: enlarging left lower lobe subpleural mass  Patient diagnosed July 2014 with stage IB non small cell lung cancer, invasive squamous cell carcinoma  Biopsies of LLL (if applicable) revealed: poorly differentiated invasive squamous cell carcinoma with associated extensive tumor necrosis. Pathology done 12/22/2012.  Tobacco/Marijuana/Snuff/ETOH use: Currently every day smoker  Past/Anticipated interventions by cardiothoracic surgery, if any: Not a surgical candidate  Past/Anticipated interventions by medical oncology, if any: Dr. Julien Nordmann will follow up with the patient in 3 months with repeat CT scan of the chest for evaluation  Signs/Symptoms  Weight changes, if any: 5 lb weight loss due to decreased appetite  Respiratory complaints, if any: Shortness of breath with exertion  Hemoptysis, if any: None noted  Pain issues, if any:  None noted  SAFETY ISSUES:  Prior radiation? YES  Pacemaker/ICD? NO  Possible current pregnancy? NO  Is the patient on methotrexate? NO  Current Complaints / other details:  79 year old female. AX: iohexol. Also, reports fatigue. Current everyday smoker.

## 2013-11-11 ENCOUNTER — Ambulatory Visit
Admission: RE | Admit: 2013-11-11 | Discharge: 2013-11-11 | Disposition: A | Discharge status: Home / Self Care | Payer: Medicare Other | Source: Ambulatory Visit | Attending: Radiation Oncology | Admitting: Radiation Oncology

## 2013-11-11 ENCOUNTER — Encounter: Payer: Self-pay | Admitting: Radiation Oncology

## 2013-11-11 VITALS — BP 141/59 | HR 97 | Temp 98.4°F | Resp 18 | Wt 94.0 lb

## 2013-11-11 DIAGNOSIS — Z510 Encounter for antineoplastic radiation therapy: Secondary | ICD-10-CM | POA: Diagnosis present

## 2013-11-11 DIAGNOSIS — R0602 Shortness of breath: Secondary | ICD-10-CM | POA: Diagnosis not present

## 2013-11-11 DIAGNOSIS — Z923 Personal history of irradiation: Secondary | ICD-10-CM | POA: Diagnosis not present

## 2013-11-11 DIAGNOSIS — C3490 Malignant neoplasm of unspecified part of unspecified bronchus or lung: Secondary | ICD-10-CM | POA: Insufficient documentation

## 2013-11-11 DIAGNOSIS — R05 Cough: Secondary | ICD-10-CM | POA: Diagnosis not present

## 2013-11-11 DIAGNOSIS — R059 Cough, unspecified: Secondary | ICD-10-CM | POA: Insufficient documentation

## 2013-11-11 DIAGNOSIS — C3430 Malignant neoplasm of lower lobe, unspecified bronchus or lung: Secondary | ICD-10-CM

## 2013-11-11 NOTE — Progress Notes (Addendum)
Radiation Oncology         [phone] ________________________________  Name: [name] MRN: [id number]  Date: 11/11/2013  DOB: 1935/04/17  Follow-Up Visit Note  CC: [name], MD  [name], MD  Diagnosis:   79 yo woman with Stage IB squamous cell carcinoma of the left lower lung s/p curative, definitive stereotactic body radiotherapy to 50 Gy in 5 fractions through 02/27/2013  Interval Since Last Radiation:  9  months  Narrative:  The patient returns today for evaluation of an enlarging left lower lung nodule at her previous treatment site..  The patient completed stereotactic body radiotherapy for a 5.5 cm left lower lung mass in September 2014. At the time of her first followup scan in January 2015, the mass had regressed in size. In May, chest CT demonstrated a new nodular component along the inferior and lateral edge which was enlarging measuring 2.5 cm. Accordingly, the patient underwent PET CT showing a hypermetabolism of 8.1 associated with the new 2.5 cm discrete well defined nodule while the majority of the previously treated mass continued for aggressive in size showing no hypermetabolism. The new 2.5 cm nodule is attached to the previous mass and partially within the high-dose radiation region.                              ALLERGIES:  is allergic to iohexol.  Meds: Current Outpatient Prescriptions  Medication Sig Dispense Refill  . COMBIVENT RESPIMAT 20-100 MCG/ACT AERS respimat Inhale into the lungs 4 (four) times daily as needed.      [name] losartan (COZAAR) 25 MG tablet       . OVER THE COUNTER MEDICATION Take 1 capsule by mouth daily. Calcium carbonate 333 mg, magnesium oxide 133 mg and zinc sulfate 5 mg      . oxyCODONE-acetaminophen (PERCOCET/ROXICET) 5-325 MG per tablet Take 1-2 tablets by mouth every 4 (four) hours as needed.  60 tablet  0   No current facility-administered medications for this encounter.    Physical Findings: The patient is in no acute  distress. Patient is alert and oriented.  weight is 94 lb (42.638 kg). Her temperature is 98.4 F (36.9 C). Her blood pressure is 141/59 and her pulse is 97. Her respiration is 18 and oxygen saturation is 95%. .  No significant changes.  Lab Findings: Lab Results  Component Value Date   WBC 10.6* 10/05/2013   HGB 13.5 10/05/2013   HCT 40.1 10/05/2013   MCV 96.8 10/05/2013   PLT 399 10/05/2013    @LASTCHEM @  Radiographic Findings: Nm Pet Image Restag (ps) Skull Base To Thigh  10/30/2013   CLINICAL DATA:  Subsequent treatment strategy for lung cancer. Enlarging lobulated component inferiorly on recent CT.  EXAM: NUCLEAR MEDICINE PET SKULL BASE TO THIGH  TECHNIQUE: 6.2 mCi F-18 FDG was injected intravenously. Full-ring PET imaging was performed from the skull base to thigh after the radiotracer. CT data was obtained and used for attenuation correction and anatomic localization.  FASTING BLOOD GLUCOSE:  Value: 142 mg/dl  COMPARISON:  PET CT 12/19/2012.  Chest CT 10/05/2013.  FINDINGS: NECK  No hypermetabolic cervical lymph nodes are identified.There are no lesions of the pharyngeal mucosal space. There is prominent muscular activity posteriorly in the upper neck.  CHEST  The enlarging left lower lobe mass demonstrated on recent CT is hypermetabolic. This measures 2.8 x 2.4 cm on image 58 and has an SUV max of 8.1.  The chronic subpleural density more superior medially is stable and without hypermetabolic activity. Since the CT performed 3 weeks ago, the patient has developed right middle lobe collapse with air bronchograms and low level hypermetabolic activity (SUV max 3.5). No focal endobronchial lesion is identified. Diffuse emphysema and scattered nodularity throughout the lungs are again noted. There is a new irregular 9 mm subpleural nodule in the right lower lobe on image 56. This is not hypermetabolic and presumably inflammatory given its recent development. There are no hypermetabolic mediastinal or hilar  lymph nodes.  ABDOMEN/PELVIS  There is no hypermetabolic activity within the liver, adrenal glands, spleen or pancreas. There is no hypermetabolic nodal activity. Chronic right renal scarring and hydronephrosis are stable.  SKELETON  There is no hypermetabolic activity to suggest osseous metastatic disease.  IMPRESSION: 1. PET confirms the suspicion of localized recurrence in the left lower lobe. The enlarging nodule is moderately hypermetabolic. 2. No evidence of distant metastatic disease. 3. Chronic lung disease with emphysema and scattered nodularity with a tree-in-bud distribution, again suggesting chronic indolent infection. A new right lower lobe nodule is not hypermetabolic and likely inflammatory. There is new right middle lobe collapse with air bronchograms.   Electronically Signed   By: Camie Patience M.D.   On: 10/30/2013 12:34   Dg Foot Complete Left  11/02/2013   Radiology report:  3 views of the left foot are obtained.  A shortened 2nd  metatarsal with screw fixation is present.  Excellent alignment and  appearance of the surgical foot and screw fixation is noted.  Otherwise  xrays appear unchanged from prior to surgery.   Dg Foot Complete Right  10/16/2013   Radiology report: 3 views of the right foot are obtained. Acceptable  alignment and healing of the second and fourth digits are noted. Third  digit is contracted  from where the pin had removed removed early status  post surgery. Diffuse osteopenia continues to be present. Acceptable  healing is present postoperatively..   Impression:  The patient has a new enlarging mass at the site of her previously treated left lower lung mass.  The enlarging hypermetabolic lesion could potentially represent masslike fibrosis versus recurrent tumor. The SUV of 8.1 as well as the discretely marginated borders of the lesion are more suggestive of recurrent tumor. Biopsy is warranted.  Repeat stereotactic body radiotherapy has been reported in the  literature and relatively large single institution series.  Repeat stereotactic body radiotherapy offers good local control with limited morbidity. Since her new enlarging nodule is smaller and further away from central lung structures, I would certainly be willing to consider repeat stereotactic body radiotherapy. However, in the interest of optimizing her care, I would also be interested in a consultation with interventional radiology regarding other minimally invasive options including percutaneous radiofrequency ablation.  Plan:  Today, I requested a consultation with interventional radiology for CT-guided biopsy and possible intervention if the biopsy is positive. I will look forward to their input. Will followup with the patient after that consultation.  _____________________________________  Sheral Apley Tammi Klippel, M.D.

## 2013-11-11 NOTE — Progress Notes (Signed)
Please see the Nurse Progress Note in the MD Initial Consult Encounter for this patient. 

## 2013-11-12 ENCOUNTER — Other Ambulatory Visit: Payer: Self-pay | Admitting: Radiation Oncology

## 2013-11-12 DIAGNOSIS — C3430 Malignant neoplasm of lower lobe, unspecified bronchus or lung: Secondary | ICD-10-CM

## 2013-11-12 DIAGNOSIS — C3490 Malignant neoplasm of unspecified part of unspecified bronchus or lung: Secondary | ICD-10-CM

## 2013-11-12 DIAGNOSIS — R918 Other nonspecific abnormal finding of lung field: Secondary | ICD-10-CM

## 2013-11-12 NOTE — Addendum Note (Signed)
Encounter addended by: Heywood Footman, RN on: 11/12/2013  8:29 AM<BR>     Documentation filed: Charges VN

## 2013-11-13 ENCOUNTER — Telehealth: Payer: Self-pay | Admitting: Podiatrist

## 2013-11-13 ENCOUNTER — Encounter: Payer: Medicare Other | Admitting: Podiatrist

## 2013-11-13 NOTE — Addendum Note (Signed)
Encounter addended by: Arlyss Repress, RN on: 11/13/2013  8:12 AM<BR>     Documentation filed: Charges VN

## 2013-11-13 NOTE — Telephone Encounter (Signed)
Patient was a no show for her second post-op visit. I called to speak to [facility] in regards to her missed appointment. Her husband answered the phone and I asked if I could leave a message with him to have her call back in regards to her missed appointment. Her husband stated she spoke to someone and told them to cancel the appointment that she was doing great and did not want to reschedule the post-op visit. JMS

## 2013-11-16 NOTE — Addendum Note (Signed)
Encounter addended by: Heywood Footman, RN on: 11/16/2013  8:39 AM<BR>     Documentation filed: Charges VN

## 2013-11-18 ENCOUNTER — Ambulatory Visit
Admission: RE | Admit: 2013-11-18 | Discharge: 2013-11-18 | Disposition: A | Discharge status: Home / Self Care | Payer: Medicare Other | Source: Ambulatory Visit | Attending: Radiation Oncology | Admitting: Radiation Oncology

## 2013-11-18 ENCOUNTER — Ambulatory Visit: Payer: Medicare Other | Admitting: Nutrition

## 2013-11-18 DIAGNOSIS — C3490 Malignant neoplasm of unspecified part of unspecified bronchus or lung: Secondary | ICD-10-CM

## 2013-11-18 NOTE — Progress Notes (Signed)
79 year old female diagnosed with non-small cell lung cancer.  She is a patient of Dr. Earlie Server and Dr. Tammi Klippel.  Past medical history includes COPD, hypertension, and radiation therapy.  Medications include Percocet and Cozaar.  Labs were reviewed.  Height: 64 inches. Weight: 93.6 pounds. Usual body weight: 93-98 pounds over the last 6 months. BMI: 16.32.  Patient presents to nutrition consult with her daughter.  Patient lives with husband.  Neither cook.  Patient's daughter helps patient by grocery shopping and providing easy to heat up meals.  Dietary recall reveals patient consuming approximately 800 calories daily.  She enjoys drinking ensure and has just begun ensure twice a day.  She tolerates most foods except for nuts.  Patient denies other nutrition impact symptoms.  Nutrition diagnosis: Underweight related to inadequate oral intake as evidenced by BMI of 16.32.  Intervention: Patient was educated to add complex carbohydrate and protein in 3 meals daily as well as having snacks between meals.  Recommended patient increase ensure to Ensure Plus twice a day to 3 times a day as tolerated.  Provided one complementary case of Ensure Plus.  Reviewed strategies for adding calories.  Assisted patient with sample menu ideas.  Fact sheets were provided.  Teach back method used.  Monitoring, evaluation, goals: Patient will increase oral intake to promote weight gain.  Next visit: Patient has my contact information and will call me with questions or concerns.

## 2013-11-19 ENCOUNTER — Other Ambulatory Visit (HOSPITAL_COMMUNITY): Payer: Self-pay | Admitting: Interventional Radiology

## 2013-11-19 DIAGNOSIS — C3490 Malignant neoplasm of unspecified part of unspecified bronchus or lung: Secondary | ICD-10-CM

## 2013-11-23 ENCOUNTER — Encounter (HOSPITAL_COMMUNITY): Payer: Self-pay | Admitting: Pharmacy Technician

## 2013-11-25 ENCOUNTER — Encounter (HOSPITAL_COMMUNITY)
Admission: RE | Admit: 2013-11-25 | Discharge: 2013-11-25 | Disposition: A | Discharge status: Home / Self Care | Payer: Medicare Other | Source: Ambulatory Visit | Attending: Interventional Radiology | Admitting: Interventional Radiology

## 2013-11-25 ENCOUNTER — Encounter (HOSPITAL_COMMUNITY): Payer: Self-pay

## 2013-11-25 ENCOUNTER — Other Ambulatory Visit: Payer: Self-pay | Admitting: Radiology

## 2013-11-25 ENCOUNTER — Ambulatory Visit (HOSPITAL_COMMUNITY)
Admission: RE | Admit: 2013-11-25 | Discharge: 2013-11-25 | Disposition: A | Discharge status: Home / Self Care | Payer: Medicare Other | Source: Ambulatory Visit | Attending: Anesthesiology | Admitting: Anesthesiology

## 2013-11-25 DIAGNOSIS — Z01818 Encounter for other preprocedural examination: Secondary | ICD-10-CM | POA: Insufficient documentation

## 2013-11-25 DIAGNOSIS — Z01810 Encounter for preprocedural cardiovascular examination: Secondary | ICD-10-CM | POA: Insufficient documentation

## 2013-11-25 DIAGNOSIS — R52 Pain, unspecified: Secondary | ICD-10-CM

## 2013-11-25 DIAGNOSIS — Z01812 Encounter for preprocedural laboratory examination: Secondary | ICD-10-CM | POA: Insufficient documentation

## 2013-11-25 DIAGNOSIS — J4489 Other specified chronic obstructive pulmonary disease: Secondary | ICD-10-CM | POA: Insufficient documentation

## 2013-11-25 DIAGNOSIS — J449 Chronic obstructive pulmonary disease, unspecified: Secondary | ICD-10-CM | POA: Insufficient documentation

## 2013-11-25 DIAGNOSIS — J984 Other disorders of lung: Secondary | ICD-10-CM | POA: Insufficient documentation

## 2013-11-25 HISTORY — DX: Unspecified injury of head, initial encounter: S09.90XA

## 2013-11-25 HISTORY — DX: Unspecified osteoarthritis, unspecified site: M19.90

## 2013-11-25 HISTORY — DX: Spontaneous ecchymoses: R23.3

## 2013-11-25 HISTORY — DX: Disorder of kidney and ureter, unspecified: N28.9

## 2013-11-25 HISTORY — DX: Other skin changes: R23.8

## 2013-11-25 HISTORY — DX: Pain, unspecified: R52

## 2013-11-25 HISTORY — DX: Shortness of breath: R06.02

## 2013-11-25 HISTORY — DX: Unspecified atherosclerosis: I70.90

## 2013-11-25 LAB — CBC WITH DIFFERENTIAL/PLATELET
BASOS ABS: 0 10*3/uL (ref 0.0–0.1)
Basophils Relative: 0 % (ref 0–1)
EOS ABS: 0.1 10*3/uL (ref 0.0–0.7)
EOS PCT: 1 % (ref 0–5)
HCT: 39.7 % (ref 36.0–46.0)
Hemoglobin: 12.7 g/dL (ref 12.0–15.0)
Lymphocytes Relative: 16 % (ref 12–46)
Lymphs Abs: 1.5 10*3/uL (ref 0.7–4.0)
MCH: 31.4 pg (ref 26.0–34.0)
MCHC: 32 g/dL (ref 30.0–36.0)
MCV: 98 fL (ref 78.0–100.0)
Monocytes Absolute: 1 10*3/uL (ref 0.1–1.0)
Monocytes Relative: 11 % (ref 3–12)
Neutro Abs: 6.8 10*3/uL (ref 1.7–7.7)
Neutrophils Relative %: 72 % (ref 43–77)
PLATELETS: 404 10*3/uL — AB (ref 150–400)
RBC: 4.05 MIL/uL (ref 3.87–5.11)
RDW: 16.3 % — AB (ref 11.5–15.5)
WBC: 9.3 10*3/uL (ref 4.0–10.5)

## 2013-11-25 LAB — BASIC METABOLIC PANEL
BUN: 14 mg/dL (ref 6–23)
CALCIUM: 10.1 mg/dL (ref 8.4–10.5)
CO2: 32 mEq/L (ref 19–32)
CREATININE: 0.46 mg/dL — AB (ref 0.50–1.10)
Chloride: 98 mEq/L (ref 96–112)
GFR calc Af Amer: >90 mL/min (ref >90)
GFR calc non Af Amer: >90 mL/min (ref >90)
Glucose, Bld: 118 mg/dL — ABNORMAL HIGH (ref 70–99)
Potassium: 6 mEq/L — ABNORMAL HIGH (ref 3.7–5.3)
SODIUM: 139 meq/L (ref 137–147)

## 2013-11-25 LAB — PROTIME-INR
INR: 1 (ref 0.00–1.49)
PROTHROMBIN TIME: 13.2 s (ref 11.6–15.2)

## 2013-11-25 LAB — ABO/RH: ABO/RH(D): O POS

## 2013-11-25 LAB — APTT: APTT: 29 s (ref 24–37)

## 2013-11-25 NOTE — Pre-Procedure Instructions (Addendum)
PREOP ANESTHESIOLOGIST CONSULTATION COMPLETED BY DR. ROSE AND CXR REPEATED TODAY PER ORDER DR. ROSE.  EKG ALSO DONE. PREOP POTASSIUM ELEVATED - RADIOLOGIST REVIEW THEIR PT'S PREOP LABS.

## 2013-11-25 NOTE — Patient Instructions (Signed)
YOUR SURGERY IS SCHEDULED AT [facility]  ON:  Friday  6/26  REPORT TO  RADIOLOGY TO REGISTER AT 9:15 AM   PLEASE COME IN THE [place] ENTRANCE AND FOLLOW SIGNS TO RADIOLOGY.  DO NOT EAT OR DRINK ANYTHING AFTER MIDNIGHT THE NIGHT BEFORE YOUR SURGERY.  YOU MAY BRUSH YOUR TEETH, RINSE OUT YOUR MOUTH--BUT NO WATER, NO FOOD, NO CHEWING GUM, NO MINTS, NO CANDIES, NO CHEWING TOBACCO.  PLEASE TAKE THE FOLLOWING MEDICATIONS THE AM OF YOUR SURGERY WITH A FEW SIPS OF WATER:  DILTIAZEM.  USE YOUR COMBIVENT INHALER.  I DO NOT BRING VALUABLES, MONEY, CREDIT CARDS.  DO NOT WEAR JEWELRY, MAKE-UP, NAIL POLISH AND NO METAL PINS OR CLIPS IN YOUR HAIR. CONTACT LENS, DENTURES / PARTIALS, GLASSES SHOULD NOT BE WORN TO SURGERY AND IN MOST CASES-HEARING AIDS WILL NEED TO BE REMOVED.  BRING YOUR GLASSES CASE, ANY EQUIPMENT NEEDED FOR YOUR CONTACT LENS. FOR PATIENTS ADMITTED TO THE HOSPITAL--CHECK OUT TIME THE DAY OF DISCHARGE IS 11:00 AM.  ALL INPATIENT ROOMS ARE PRIVATE - WITH BATHROOM, TELEPHONE, TELEVISION AND WIFI INTERNET.  PLEASE BE AWARE THAT YOU MAY NEED ADDITIONAL BLOOD DRAWN DAY OF YOUR SURGERY  _______________________________________________________________________   [facility] - Preparing for Surgery Before surgery, you can play an important role.  Because skin is not sterile, your skin needs to be as free of germs as possible.  You can reduce the number of germs on your skin by washing with CHG (chlorahexidine gluconate) soap before surgery.  CHG is an antiseptic cleaner which kills germs and bonds with the skin to continue killing germs even after washing. Please DO NOT use if you have an allergy to CHG or antibacterial soaps.  If your skin becomes reddened/irritated stop using the CHG and inform your nurse when you arrive at Short Stay. Do not shave (including legs and underarms) for at least 48 hours prior to the first CHG shower.  You may shave your face/neck. Please  follow these instructions carefully:  1.  Shower with CHG Soap the night before surgery and the  morning of Surgery.  2.  If you choose to wash your hair, wash your hair first as usual with your  normal  shampoo.  3.  After you shampoo, rinse your hair and body thoroughly to remove the  shampoo.                           4.  Use CHG as you would any other liquid soap.  You can apply chg directly  to the skin and wash                       Gently with a scrungie or clean washcloth.  5.  Apply the CHG Soap to your body ONLY FROM THE NECK DOWN.   Do not use on face/ open                           Wound or open sores. Avoid contact with eyes, ears mouth and genitals (private parts).                       Wash face,  Genitals (private parts) with your normal soap.             6.  Wash thoroughly, paying special attention to the area where your surgery  will be performed.  7.  Thoroughly rinse your body with warm water from the neck down.  8.  DO NOT shower/wash with your normal soap after using and rinsing off  the CHG Soap.                9.  Pat yourself dry with a clean towel.            10.  Wear clean pajamas.            11.  Place clean sheets on your bed the night of your first shower and do not  sleep with pets. Day of Surgery : Do not apply any lotions/deodorants the morning of surgery.  Please wear clean clothes to the hospital/surgery center.  FAILURE TO FOLLOW THESE INSTRUCTIONS MAY RESULT IN THE CANCELLATION OF YOUR SURGERY PATIENT SIGNATURE_________________________________  NURSE SIGNATURE__________________________________  ________________________________________________________________________  WHAT IS A BLOOD TRANSFUSION? Blood Transfusion Information  A transfusion is the replacement of blood or some of its parts. Blood is made up of multiple cells which provide different functions.  Red blood cells carry oxygen and are used for blood loss replacement.  White blood cells  fight against infection.  Platelets control bleeding.  Plasma helps clot blood.  Other blood products are available for specialized needs, such as hemophilia or other clotting disorders. BEFORE THE TRANSFUSION  Who gives blood for transfusions?   Healthy volunteers who are fully evaluated to make sure their blood is safe. This is blood bank blood. Transfusion therapy is the safest it has ever been in the practice of medicine. Before blood is taken from a donor, a complete history is taken to make sure that person has no history of diseases nor engages in risky social behavior (examples are intravenous drug use or sexual activity with multiple partners). The donor's travel history is screened to minimize risk of transmitting infections, such as malaria. The donated blood is tested for signs of infectious diseases, such as HIV and hepatitis. The blood is then tested to be sure it is compatible with you in order to minimize the chance of a transfusion reaction. If you or a relative donates blood, this is often done in anticipation of surgery and is not appropriate for emergency situations. It takes many days to process the donated blood. RISKS AND COMPLICATIONS Although transfusion therapy is very safe and saves many lives, the main dangers of transfusion include:   Getting an infectious disease.  Developing a transfusion reaction. This is an allergic reaction to something in the blood you were given. Every precaution is taken to prevent this. The decision to have a blood transfusion has been considered carefully by your caregiver before blood is given. Blood is not given unless the benefits outweigh the risks. AFTER THE TRANSFUSION  Right after receiving a blood transfusion, you will usually feel much better and more energetic. This is especially true if your red blood cells have gotten low (anemic). The transfusion raises the level of the red blood cells which carry oxygen, and this usually  causes an energy increase.  The nurse administering the transfusion will monitor you carefully for complications. HOME CARE INSTRUCTIONS  No special instructions are needed after a transfusion. You may find your energy is better. Speak with your caregiver about any limitations on activity for underlying diseases you may have. SEEK MEDICAL CARE IF:   Your condition is not improving after your transfusion.  You develop redness or irritation at the intravenous (IV) site. SEEK IMMEDIATE  MEDICAL CARE IF:  Any of the following symptoms occur over the next 12 hours:  Shaking chills.  You have a temperature by mouth above 102 F (38.9 C), not controlled by medicine.  Chest, back, or muscle pain.  People around you feel you are not acting correctly or are confused.  Shortness of breath or difficulty breathing.  Dizziness and fainting.  You get a rash or develop hives.  You have a decrease in urine output.  Your urine turns a dark color or changes to pink, red, or brown. Any of the following symptoms occur over the next 10 days:  You have a temperature by mouth above 102 F (38.9 C), not controlled by medicine.  Shortness of breath.  Weakness after normal activity.  The white part of the eye turns yellow (jaundice).  You have a decrease in the amount of urine or are urinating less often.  Your urine turns a dark color or changes to pink, red, or brown. Document Released: 05/18/2000 Document Revised: 08/13/2011 Document Reviewed: 01/05/2008 [facility] Patient Information 2014 [place], [place].  _______________________________________________________________________

## 2013-11-25 NOTE — Anesthesia Preprocedure Evaluation (Addendum)
Anesthesia Evaluation  Patient identified by MRN, date of birth, ID band Patient awake  General Assessment Comment:1. The radiated subpleural mass in the left lower lobe demonstrates an enlarging lobulated component inferiorly worrisome for local recurrence of lung cancer. Follow up PET-CT suggested to assess for recurrent metabolic activity in this area. 2. No distant metastases identified. 3. Severe lung disease with diffuse emphysema and new peribronchovascular nodularity in the right upper and lower lobes, likely inflammatory.   Reviewed: Allergy & Precautions, H&P , NPO status , Patient's Chart, lab work & pertinent test results  Airway Mallampati: II TM Distance: >3 FB Neck ROM: Full    Dental no notable dental hx.    Pulmonary shortness of breath, COPD COPD inhaler, Current Smoker,  Lung cancer   FINISED STEROTACTIC RADIOTHERAPY FOR STAGE 1B SQUAMOUS CELL CARCINOMA LEFT LOWER LUNG February 27, 2013.;  MOST RECENT CT SHOWING ENLARGING SOFT TISSUE NODULE AT THE SITE OF TREATED MASS - MICROWAVE ABLATION IS PLANNED   Severe emphysema breath sounds clear to auscultation  Pulmonary exam normal       Cardiovascular hypertension, Pt. on medications Rhythm:Regular Rate:Normal     Neuro/Psych negative neurological ROS  negative psych ROS   GI/Hepatic negative GI ROS, Neg liver ROS,   Endo/Other  negative endocrine ROS  Renal/GU Renal disease     Musculoskeletal negative musculoskeletal ROS (+)   Abdominal   Peds  Hematology negative hematology ROS (+)   Anesthesia Other Findings   Reproductive/Obstetrics negative OB ROS                          Anesthesia Physical Anesthesia Plan  ASA: III  Anesthesia Plan: General   Post-op Pain Management:    Induction: Intravenous  Airway Management Planned: Oral ETT  Additional Equipment:   Intra-op Plan:   Post-operative Plan: Possible  Post-op intubation/ventilation  Informed Consent: I have reviewed the patients History and Physical, chart, labs and discussed the procedure including the risks, benefits and alternatives for the proposed anesthesia with the patient or authorized representative who has indicated his/her understanding and acceptance.   Dental advisory given  Plan Discussed with: CRNA  Anesthesia Plan Comments: (Discussed possibility of needing mechanical ventilation post-procedure. Patient voices understanding)        Anesthesia Quick Evaluation

## 2013-11-26 ENCOUNTER — Other Ambulatory Visit: Payer: Self-pay | Admitting: Radiology

## 2013-11-27 ENCOUNTER — Observation Stay (HOSPITAL_COMMUNITY): Payer: Medicare Other

## 2013-11-27 ENCOUNTER — Telehealth: Payer: Self-pay | Admitting: Radiation Oncology

## 2013-11-27 ENCOUNTER — Encounter (HOSPITAL_COMMUNITY): Payer: Self-pay

## 2013-11-27 ENCOUNTER — Inpatient Hospital Stay (HOSPITAL_COMMUNITY)
Admission: RE | Admit: 2013-11-27 | Discharge: 2013-11-30 | DRG: 181 | Disposition: A | Discharge status: Home Health | Payer: Medicare Other | Source: Ambulatory Visit | Attending: Interventional Radiology | Admitting: Interventional Radiology

## 2013-11-27 ENCOUNTER — Encounter (HOSPITAL_COMMUNITY)
Admission: RE | Disposition: A | Discharge status: Home Health | Payer: Self-pay | Source: Ambulatory Visit | Attending: Interventional Radiology

## 2013-11-27 ENCOUNTER — Encounter (HOSPITAL_COMMUNITY): Payer: Medicare Other | Admitting: Anesthesiology

## 2013-11-27 ENCOUNTER — Encounter (HOSPITAL_COMMUNITY): Payer: Self-pay | Admitting: Certified Registered Nurse Anesthetist

## 2013-11-27 ENCOUNTER — Ambulatory Visit (HOSPITAL_COMMUNITY): Payer: Medicare Other | Admitting: Anesthesiology

## 2013-11-27 ENCOUNTER — Ambulatory Visit (HOSPITAL_COMMUNITY)
Admission: RE | Admit: 2013-11-27 | Discharge: 2013-11-27 | Disposition: A | Discharge status: Home / Self Care | Payer: Medicare Other | Source: Ambulatory Visit | Attending: Interventional Radiology | Admitting: Interventional Radiology

## 2013-11-27 ENCOUNTER — Encounter (HOSPITAL_COMMUNITY): Payer: Self-pay | Admitting: *Deleted

## 2013-11-27 DIAGNOSIS — C3492 Malignant neoplasm of unspecified part of left bronchus or lung: Secondary | ICD-10-CM | POA: Diagnosis present

## 2013-11-27 DIAGNOSIS — Z809 Family history of malignant neoplasm, unspecified: Secondary | ICD-10-CM

## 2013-11-27 DIAGNOSIS — J4489 Other specified chronic obstructive pulmonary disease: Secondary | ICD-10-CM | POA: Diagnosis present

## 2013-11-27 DIAGNOSIS — Z85118 Personal history of other malignant neoplasm of bronchus and lung: Secondary | ICD-10-CM

## 2013-11-27 DIAGNOSIS — J449 Chronic obstructive pulmonary disease, unspecified: Secondary | ICD-10-CM | POA: Diagnosis present

## 2013-11-27 DIAGNOSIS — I2510 Atherosclerotic heart disease of native coronary artery without angina pectoris: Secondary | ICD-10-CM | POA: Diagnosis present

## 2013-11-27 DIAGNOSIS — C3490 Malignant neoplasm of unspecified part of unspecified bronchus or lung: Principal | ICD-10-CM | POA: Diagnosis present

## 2013-11-27 DIAGNOSIS — Z923 Personal history of irradiation: Secondary | ICD-10-CM

## 2013-11-27 DIAGNOSIS — Z8249 Family history of ischemic heart disease and other diseases of the circulatory system: Secondary | ICD-10-CM

## 2013-11-27 DIAGNOSIS — Z79899 Other long term (current) drug therapy: Secondary | ICD-10-CM

## 2013-11-27 DIAGNOSIS — E875 Hyperkalemia: Secondary | ICD-10-CM | POA: Diagnosis present

## 2013-11-27 DIAGNOSIS — Z91041 Radiographic dye allergy status: Secondary | ICD-10-CM

## 2013-11-27 DIAGNOSIS — I10 Essential (primary) hypertension: Secondary | ICD-10-CM | POA: Diagnosis present

## 2013-11-27 DIAGNOSIS — F17200 Nicotine dependence, unspecified, uncomplicated: Secondary | ICD-10-CM | POA: Diagnosis present

## 2013-11-27 DIAGNOSIS — J9383 Other pneumothorax: Secondary | ICD-10-CM | POA: Diagnosis not present

## 2013-11-27 DIAGNOSIS — M19049 Primary osteoarthritis, unspecified hand: Secondary | ICD-10-CM | POA: Diagnosis present

## 2013-11-27 LAB — COMPREHENSIVE METABOLIC PANEL
ALBUMIN: 3.5 g/dL (ref 3.5–5.2)
ALT: 10 U/L (ref 0–35)
AST: 14 U/L (ref 0–37)
Alkaline Phosphatase: 86 U/L (ref 39–117)
BUN: 14 mg/dL (ref 6–23)
CO2: 31 meq/L (ref 19–32)
Calcium: 9.3 mg/dL (ref 8.4–10.5)
Chloride: 99 mEq/L (ref 96–112)
Creatinine, Ser: 0.46 mg/dL — ABNORMAL LOW (ref 0.50–1.10)
GFR calc non Af Amer: >90 mL/min (ref >90)
GLUCOSE: 103 mg/dL — AB (ref 70–99)
POTASSIUM: 5.4 meq/L — AB (ref 3.7–5.3)
Sodium: 138 mEq/L (ref 137–147)
Total Bilirubin: 0.3 mg/dL (ref 0.3–1.2)
Total Protein: 7.4 g/dL (ref 6.0–8.3)

## 2013-11-27 LAB — TYPE AND SCREEN
ABO/RH(D): O POS
Antibody Screen: NEGATIVE

## 2013-11-27 SURGERY — RADIO FREQUENCY ABLATION
Anesthesia: General

## 2013-11-27 MED ORDER — SODIUM CHLORIDE 0.9 % IV SOLN
INTRAVENOUS | Status: DC | PRN
  Administered 2013-11-27 (×2): via INTRAVENOUS

## 2013-11-27 MED ORDER — FENTANYL CITRATE 0.05 MG/ML IJ SOLN
INTRAMUSCULAR | Status: AC
  Filled 2013-11-27: qty 5

## 2013-11-27 MED ORDER — HYDROMORPHONE HCL PF 1 MG/ML IJ SOLN
INTRAMUSCULAR | Status: AC
  Filled 2013-11-27: qty 1

## 2013-11-27 MED ORDER — FENTANYL CITRATE 0.05 MG/ML IJ SOLN
INTRAMUSCULAR | Status: DC | PRN
  Administered 2013-11-27: 50 ug via INTRAVENOUS

## 2013-11-27 MED ORDER — PHENYLEPHRINE HCL 10 MG/ML IJ SOLN
INTRAMUSCULAR | Status: DC | PRN
  Administered 2013-11-27: 20 ug via INTRAVENOUS
  Administered 2013-11-27 (×2): 40 ug via INTRAVENOUS
  Administered 2013-11-27: 10 ug via INTRAVENOUS
  Administered 2013-11-27: 20 ug via INTRAVENOUS
  Administered 2013-11-27: 40 ug via INTRAVENOUS

## 2013-11-27 MED ORDER — NEOSTIGMINE METHYLSULFATE 10 MG/10ML IV SOLN
INTRAVENOUS | Status: DC | PRN
  Administered 2013-11-27: 2 mg via INTRAVENOUS

## 2013-11-27 MED ORDER — GLYCOPYRROLATE 0.2 MG/ML IJ SOLN
INTRAMUSCULAR | Status: DC | PRN
  Administered 2013-11-27: .4 mg via INTRAVENOUS

## 2013-11-27 MED ORDER — LEVOFLOXACIN IN D5W 500 MG/100ML IV SOLN
500.0000 mg | INTRAVENOUS | Status: DC
  Filled 2013-11-27: qty 100

## 2013-11-27 MED ORDER — MEPERIDINE HCL 50 MG/ML IJ SOLN
6.2500 mg | INTRAMUSCULAR | Status: DC | PRN
Start: 2013-11-27 — End: 2013-11-27

## 2013-11-27 MED ORDER — ONDANSETRON HCL 4 MG/2ML IJ SOLN: 4.0000 mg | Freq: Four times a day (QID) | INTRAMUSCULAR | Status: DC | PRN

## 2013-11-27 MED ORDER — LEVOFLOXACIN IN D5W 500 MG/100ML IV SOLN
INTRAVENOUS | Status: DC | PRN
  Administered 2013-11-27: 500 mg via INTRAVENOUS

## 2013-11-27 MED ORDER — PROPOFOL 10 MG/ML IV BOLUS
INTRAVENOUS | Status: AC
  Filled 2013-11-27: qty 20

## 2013-11-27 MED ORDER — CEFAZOLIN SODIUM-DEXTROSE 2-3 GM-% IV SOLR: 2.0000 g | Freq: Once | INTRAVENOUS | Status: DC

## 2013-11-27 MED ORDER — LACTATED RINGERS IV SOLN
INTRAVENOUS | Status: DC
  Administered 2013-11-27: 09:00:00 via INTRAVENOUS

## 2013-11-27 MED ORDER — OXYCODONE HCL 5 MG/5ML PO SOLN
5.0000 mg | Freq: Once | ORAL | Status: DC | PRN
  Filled 2013-11-27: qty 5

## 2013-11-27 MED ORDER — OXYCODONE HCL 5 MG PO TABS: 5.0000 mg | ORAL_TABLET | Freq: Once | ORAL | Status: DC | PRN

## 2013-11-27 MED ORDER — CISATRACURIUM BESYLATE (PF) 10 MG/5ML IV SOLN
INTRAVENOUS | Status: DC | PRN
  Administered 2013-11-27: 4 mg via INTRAVENOUS
  Administered 2013-11-27: 8 mg via INTRAVENOUS

## 2013-11-27 MED ORDER — LIDOCAINE HCL (CARDIAC) 20 MG/ML IV SOLN
INTRAVENOUS | Status: DC | PRN
  Administered 2013-11-27: 75 mg via INTRAVENOUS

## 2013-11-27 MED ORDER — SUCCINYLCHOLINE CHLORIDE 20 MG/ML IJ SOLN
INTRAMUSCULAR | Status: DC | PRN
  Administered 2013-11-27: 100 mg via INTRAVENOUS

## 2013-11-27 MED ORDER — MIDAZOLAM HCL 2 MG/2ML IJ SOLN
INTRAMUSCULAR | Status: AC
  Filled 2013-11-27: qty 2

## 2013-11-27 MED ORDER — HYDROCODONE-ACETAMINOPHEN 5-325 MG PO TABS
1.0000 | ORAL_TABLET | ORAL | Status: DC | PRN
  Administered 2013-11-27 – 2013-11-28 (×5): 1 via ORAL
  Administered 2013-11-29: 2 via ORAL
  Administered 2013-11-29: 1 via ORAL
  Administered 2013-11-29: 2 via ORAL
  Administered 2013-11-29: 1 via ORAL
  Administered 2013-11-30: 2 via ORAL
  Filled 2013-11-27 (×2): qty 1
  Filled 2013-11-27: qty 2
  Filled 2013-11-27 (×2): qty 1
  Filled 2013-11-27: qty 2
  Filled 2013-11-27 (×2): qty 1
  Filled 2013-11-27 (×2): qty 2
  Filled 2013-11-27: qty 1

## 2013-11-27 MED ORDER — PROPOFOL 10 MG/ML IV BOLUS
INTRAVENOUS | Status: DC | PRN
  Administered 2013-11-27: 80 mg via INTRAVENOUS

## 2013-11-27 MED ORDER — DEXAMETHASONE SODIUM PHOSPHATE 10 MG/ML IJ SOLN
INTRAMUSCULAR | Status: DC | PRN
  Administered 2013-11-27: 10 mg via INTRAVENOUS

## 2013-11-27 MED ORDER — SODIUM CHLORIDE 0.9 % IV SOLN
10.0000 mg | INTRAVENOUS | Status: DC | PRN
  Administered 2013-11-27: 10 ug via INTRAVENOUS

## 2013-11-27 MED ORDER — PROMETHAZINE HCL 25 MG/ML IJ SOLN: 6.2500 mg | INTRAMUSCULAR | Status: DC | PRN

## 2013-11-27 MED ORDER — DOCUSATE SODIUM 100 MG PO CAPS
100.0000 mg | ORAL_CAPSULE | Freq: Two times a day (BID) | ORAL | Status: DC
  Administered 2013-11-27 – 2013-11-29 (×4): 100 mg via ORAL
  Filled 2013-11-27 (×7): qty 1

## 2013-11-27 MED ORDER — SODIUM CHLORIDE 0.9 % IV SOLN
INTRAVENOUS | Status: DC | PRN
  Administered 2013-11-27 (×2): via INTRAVENOUS

## 2013-11-27 MED ORDER — HYDROMORPHONE HCL PF 1 MG/ML IJ SOLN
0.2500 mg | INTRAMUSCULAR | Status: DC | PRN
  Administered 2013-11-27 (×2): 0.25 mg via INTRAVENOUS

## 2013-11-27 MED ORDER — EPHEDRINE SULFATE 50 MG/ML IJ SOLN
INTRAMUSCULAR | Status: DC | PRN
  Administered 2013-11-27: 5 mg via INTRAVENOUS
  Administered 2013-11-27: 10 mg via INTRAVENOUS
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
## 2013-11-27 NOTE — Anesthesia Procedure Notes (Signed)
Procedure Name: Intubation Date/Time: 11/27/2013 11:51 AM Performed by: Ofilia Neas Pre-anesthesia Checklist: Patient identified, Timeout performed, Emergency Drugs available, Suction available and Patient being monitored Patient Re-evaluated:Patient Re-evaluated prior to inductionOxygen Delivery Method: Circle system utilized Preoxygenation: Pre-oxygenation with 100% oxygen Intubation Type: IV induction Ventilation: Mask ventilation without difficulty Laryngoscope Size: Mac and 4 Grade View: Grade I Tube type: Oral Tube size: 7.5 mm Number of attempts: 1 Airway Equipment and Method: Stylet Placement Confirmation: ETT inserted through vocal cords under direct vision,  breath sounds checked- equal and bilateral,  positive ETCO2 and CO2 detector Secured at: 21 cm Tube secured with: Tape Dental Injury: Teeth and Oropharynx as per pre-operative assessment

## 2013-11-27 NOTE — Procedures (Signed)
Interventional Radiology Procedure Note  Procedure:  1.) CT guided bx of LLL mass positive for recurrent malignancy.  Immediate post bx PTX despite small 22G FNA 2.) Lung nodule has enlarged into 3.4 x 3.1 cm mass over a very short period of time. 3.) Ablation procedure cancelled - given interval growth benefits no longer out-weigh the risks 4.) 79F chest tube placed Complications:  Pneumothorax Recommendations: - Admit for obs - Will work toward D/Cing chest tube tomorrow - Discussed with Dr. Tammi Klippel, he will assess to see if XRT is still an option or if palliative chemotherapy may be the only treatment left  Signed,  Criselda Peaches, MD [facility]

## 2013-11-27 NOTE — Transfer of Care (Signed)
Immediate Anesthesia Transfer of Care Note  Patient: [name]  Procedure(s) Performed: Procedure(s): MICROWAVE ABLATION  (N/A)  Patient Location: PACU  Anesthesia Type:General  Level of Consciousness: awake, alert , oriented, patient cooperative and responds to stimulation  Airway & Oxygen Therapy: Patient Spontanous Breathing and Patient connected to face mask oxygen  Post-op Assessment: Report given to PACU RN, Post -op Vital signs reviewed and stable and Patient moving all extremities  Post vital signs: Reviewed and stable  Complications: No apparent anesthesia complications

## 2013-11-27 NOTE — Telephone Encounter (Signed)
Opened in error

## 2013-11-27 NOTE — H&P (Signed)
I saw and examined Mrs. Heinecke and agree with the PA note.  Plan to proceed with percutaneous thermal ablation.   Signed,  Criselda Peaches, MD [facility]

## 2013-11-27 NOTE — Progress Notes (Signed)
Subjective: Patient is now to room s/p extubation. She complains of left chest tube site tenderness. She denies any CP or worsening shortness of breath.   Objective: Physical Exam: BP 116/65  Pulse 78  Temp(Src) 97.8 F (36.6 C) (Oral)  Resp 16  Ht 5\' 4"  (1.626 m)  Wt 92 lb (41.731 kg)  BMI 15.78 kg/m2  SpO2 93%  General: A&Ox3, NAD, lying in bed Heart: RRR without M/G/R Lungs: CTA bilaterally, left chest tube dressing C/D/I, no signs of bleeding or hematoma, tenderness at site.  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 She now has an enlarging nodule at the inferior and lateral margin of the radiation field concerning for local recurrence. She presents for CT-guided biopsy followed by percutaneous thermal ablation if the biopsy is positive for recurrent malignancy.  EXAM: CT ABSCESS CATH EXCHANGE; CT BIOPSY  Date: 11/27/2013  PROCEDURE: 1. CT-guided lung biopsy 2. CT-guided placement of left chest tube Interventional Radiologist:  Criselda Peaches, MD  ANESTHESIA/SEDATION: Anesthesia provided by the anesthesiology service  TECHNIQUE: Informed consent was obtained from the patient following explanation of the procedure, risks, benefits and alternatives. The patient understands, agrees and consents for the procedure. All questions were addressed. A time out was performed.  A planning axial CT scan  was performed. The left lower lobe pulmonary nodule was easily identified. The mass has enlarged compared to the most recent prior PET-CT from 10/30/2013. Today, the mass measures between 3.5 and 3.2 cm x 3.2 cm compared to 2.8 x 2.4 cm on the prior PET-CT.  A suitable skin entry site was selected and marked. A 20 gauge trocar needle was advanced into the margin the mass. Multiple 22 gauge fine needle aspirates were then obtained. While awaiting the quit stain resolve from pathology, intermittent CT fluoroscopic imaging showed and development of intra procedural pneumothorax. A 7 [place] Yueh centesis catheter was advanced and the pleural space and connected to wall suction. The lung remains largely reinflated, however there was a near continuous air leak.  The biopsy result came back as positive for recurrent malignancy. Unfortunately, given the relatively rapid interval growth of the tumor, the utility of percutaneous thermal ablation is significantly less than what we had anticipated 2 weeks ago when I first saw her in clinic. I am afraid that the risks of the procedure now how with a benefits. Additionally, she has a new small left pleural effusion which is concerning for a malignant effusion given the large pleural abutment of the enlarging mass.  The percutaneous ablation procedure was therefore aborted. A 10 French chest tube was advanced into and apical and medial direction connected to wall suction. A final axial CT scan and demonstrates complete re-expansion of the lung. The  tube was secured in place and ale water tight sterile bandage was applied. The tube was secured to the skin with 0 Prolene suture.  IMPRESSION: 1. Rapid interval growth of the hypermetabolic left lower lobe pulmonary nodule. The nodule today measures between 3.2 and 3.5 x 3.2 cm compared to 2.8 x 2.4 cm on 10/30/2013. Additionally, there is more significant involvement of the adjacent pleural surface as well as a new small but likely  malignant pleural effusion. 2. Percutaneous fine needle aspiration biopsy confirmed recurrent malignancy. The FNA biopsy was complicated by intra procedural pneumothorax. 3. The percutaneous thermal ablation procedure was aborted secondary to the rapid interval growth of the lesion. The lesion with progressive pleural involvement and new likely malignant pleural effusion has exceeded the capability of thermal ablation therapy. 4. A 10 French percutaneous chest tube was placed for treatment of the biopsy related pneumothorax. The findings and plan were discussed both with the patient's daughter, and also with Dr. Tammi Klippel of Radiation Oncology. The results will also be communicated to the patient when she awakes from general anesthesia.  Signed,  Criselda Peaches, MD  [facility]  [facility]   Electronically Signed   By: Jacqulynn Cadet M.D.   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 She now has an enlarging nodule at the inferior and lateral margin of the radiation field concerning for local recurrence. She presents for CT-guided biopsy followed by percutaneous thermal ablation if the biopsy is positive for recurrent malignancy.  EXAM: CT ABSCESS CATH EXCHANGE; CT BIOPSY  Date: 11/27/2013  PROCEDURE: 1. CT-guided lung biopsy 2. CT-guided placement of left chest tube Interventional Radiologist:  Criselda Peaches, MD  ANESTHESIA/SEDATION: Anesthesia provided by the anesthesiology service  TECHNIQUE: Informed consent was obtained from the patient following explanation of the procedure, risks, benefits and alternatives. The patient understands, agrees and consents for the procedure. All questions were addressed. A time out was performed.  A planning axial CT scan was performed. The left lower lobe pulmonary nodule was  easily identified. The mass has enlarged compared to the most recent prior PET-CT from 10/30/2013. Today, the mass measures between 3.5 and 3.2 cm x 3.2 cm compared to 2.8 x 2.4 cm on the prior PET-CT.  A suitable skin entry site was selected and marked. A 20 gauge trocar needle was advanced into the margin the mass. Multiple 22 gauge fine needle aspirates were then obtained. While awaiting the quit stain resolve from pathology, intermittent CT fluoroscopic imaging showed and development of intra procedural pneumothorax. A 7 [place] Yueh centesis catheter was advanced and the pleural space and connected to wall suction. The lung remains largely reinflated, however there was a near continuous air leak.  The biopsy result came back as positive for recurrent malignancy. Unfortunately, given the relatively rapid interval growth of the tumor, the utility of percutaneous thermal ablation is significantly less than what we had anticipated 2 weeks ago when I first saw her in clinic. I am afraid that the risks of the procedure now how with a benefits. Additionally, she has a new small left pleural effusion which is concerning for a malignant effusion given the large pleural abutment of the enlarging mass.  The percutaneous ablation procedure was therefore aborted. A 10 French chest tube was advanced into and apical and medial direction connected to wall  suction. A final axial CT scan and demonstrates complete re-expansion of the lung. The tube was secured in place and ale water tight sterile bandage was applied. The tube was secured to the skin with 0 Prolene suture.  IMPRESSION: 1. Rapid interval growth of the hypermetabolic left lower lobe pulmonary nodule. The nodule today measures between 3.2 and 3.5 x 3.2 cm compared to 2.8 x 2.4 cm on 10/30/2013. Additionally, there is more significant involvement of the adjacent pleural surface as well as a new small but likely malignant pleural effusion. 2. Percutaneous fine needle  aspiration biopsy confirmed recurrent malignancy. The FNA biopsy was complicated by intra procedural pneumothorax. 3. The percutaneous thermal ablation procedure was aborted secondary to the rapid interval growth of the lesion. The lesion with progressive pleural involvement and new likely malignant pleural effusion has exceeded the capability of thermal ablation therapy. 4. A 10 French percutaneous chest tube was placed for treatment of the biopsy related pneumothorax. The findings and plan were discussed both with the patient's daughter, and also with Dr. Tammi Klippel of Radiation Oncology. The results will also be communicated to the patient when she awakes from general anesthesia.  Signed,  Criselda Peaches, MD  [facility]  [facility]   Electronically Signed   By: Jacqulynn Cadet M.D.   On: 11/27/2013 14:36   Dg Chest Port 1 View  11/27/2013   CLINICAL DATA:  LEFT Chest tube, eval PTX  EXAM: PORTABLE CHEST - 1 VIEW  COMPARISON:  Two-view chest 11/25/2013 chest CT 11/27/2013  FINDINGS: The heart size and mediastinal contours are within normal limits. A very small residual left apical pneumothorax is appreciated. A small bore chest tube coped centrally within the left hemi thorax. There is prominence of the interstitial markings. Linear areas of density within the lung bases. The bones are osteopenic. Mild degenerative changes within the shoulders. No acute osseous abnormalities.  IMPRESSION: Small residual left apical pneumothorax.  Scarring versus atelectasis in the lung bases.  Findings likely represent a chronic bronchitic changes.   Electronically Signed   By: Margaree Mackintosh M.D.   On: 11/27/2013 15:07    Assessment/Plan: History of stage 1B squamous cell carcinoma of left lower lung  Hypermetabolic local recurrence of presumed lung cancer  S/p 22G FNA confirming recurrence complicated by immediate PTX 65F left chest tube placed and on low wall  suction, CXR ordered for am.  Scheduled today for image guided left lower lung mass FNA with thermal ablation under general anesthesia Thermal ablation was cancelled secondary to interval enlargement of lung mass, now measuring 3.4 x 3.1 cm-benefits no longer out-weigh the risks. Per oncology for further treatment options. I have discussed this today with the patient and she states understanding.  Patient will be admitted and monitored with serial vitals and CXR.   LOS: 0 days    Rockney Ghee 11/27/2013 4:26 PM

## 2013-11-27 NOTE — Anesthesia Postprocedure Evaluation (Signed)
Anesthesia Post Note  Patient: [name]  Procedure(s) Performed: Procedure(s) (LRB): MICROWAVE ABLATION  (N/A)  Anesthesia type: General  Patient location: PACU  Post pain: Pain level controlled  Post assessment: Post-op Vital signs reviewed  Last Vitals: BP 103/41  Pulse 77  Temp(Src) 36.3 C (Oral)  Resp 18  SpO2 100%  Post vital signs: Reviewed  Level of consciousness: sedated  Complications: No apparent anesthesia complications

## 2013-11-28 ENCOUNTER — Observation Stay (HOSPITAL_COMMUNITY): Payer: Medicare Other

## 2013-11-28 NOTE — Progress Notes (Signed)
Subjective: Pt feeling ok, sore at chest tube sight. Denies SOB. Reviewed events of yesterday, she understands what happened and why intended procedure could not be done.  Objective: Physical Exam: BP 141/73  Pulse 81  Temp(Src) 97.9 F (36.6 C) (Oral)  Resp 16  Ht 5\' 4"  (1.626 m)  Wt 92 lb (41.731 kg)  BMI 15.78 kg/m2  SpO2 94% Lungs: CTA (B) (L)chest tube intact, site clean, mildly tender.  No air leak, serous output in drain tubing. 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 She now has an enlarging nodule at the inferior and lateral margin of the radiation field concerning for local recurrence. She presents for CT-guided biopsy followed by percutaneous thermal ablation if the biopsy is positive for recurrent malignancy.  EXAM: CT ABSCESS CATH EXCHANGE; CT BIOPSY  Date: 11/27/2013  PROCEDURE: 1. CT-guided lung biopsy 2. CT-guided placement of left chest tube Interventional Radiologist:  Criselda Peaches, MD  ANESTHESIA/SEDATION: Anesthesia provided by the anesthesiology service  TECHNIQUE: Informed consent was obtained from the patient following explanation of the procedure, risks, benefits and alternatives. The patient understands, agrees and consents for the procedure. All questions were addressed. A time out was performed.  A planning axial  CT scan was performed. The left lower lobe pulmonary nodule was easily identified. The mass has enlarged compared to the most recent prior PET-CT from 10/30/2013. Today, the mass measures between 3.5 and 3.2 cm x 3.2 cm compared to 2.8 x 2.4 cm on the prior PET-CT.  A suitable skin entry site was selected and marked. A 20 gauge trocar needle was advanced into the margin the mass. Multiple 22 gauge fine needle aspirates were then obtained. While awaiting the quit stain resolve from pathology, intermittent CT fluoroscopic imaging showed and development of intra procedural pneumothorax. A 7 [place] Yueh centesis catheter was advanced and the pleural space and connected to wall suction. The lung remains largely reinflated, however there was a near continuous air leak.  The biopsy result came back as positive for recurrent malignancy. Unfortunately, given the relatively rapid interval growth of the tumor, the utility of percutaneous thermal ablation is significantly less than what we had anticipated 2 weeks ago when I first saw her in clinic. I am afraid that the risks of the procedure now how with a benefits. Additionally, she has a new small left pleural effusion which is concerning for a malignant effusion given the large pleural abutment of the enlarging mass.  The percutaneous ablation procedure was therefore aborted. A 10 French chest tube was advanced into and apical and medial direction connected to wall suction. A final axial CT scan and demonstrates complete re-expansion of the lung. The  tube was secured in place and ale water tight sterile bandage was applied. The tube was secured to the skin with 0 Prolene suture.  IMPRESSION: 1. Rapid interval growth of the hypermetabolic left lower lobe pulmonary nodule. The nodule today measures between 3.2 and 3.5 x 3.2 cm compared to 2.8 x 2.4 cm on 10/30/2013. Additionally, there is more significant involvement of the adjacent pleural surface as well as a new small but  likely malignant pleural effusion. 2. Percutaneous fine needle aspiration biopsy confirmed recurrent malignancy. The FNA biopsy was complicated by intra procedural pneumothorax. 3. The percutaneous thermal ablation procedure was aborted secondary to the rapid interval growth of the lesion. The lesion with progressive pleural involvement and new likely malignant pleural effusion has exceeded the capability of thermal ablation therapy. 4. A 10 French percutaneous chest tube was placed for treatment of the biopsy related pneumothorax. The findings and plan were discussed both with the patient's daughter, and also with Dr. Tammi Klippel of Radiation Oncology. The results will also be communicated to the patient when she awakes from general anesthesia.  Signed,  Criselda Peaches, MD  [facility]  [facility]   Electronically Signed   By: Jacqulynn Cadet M.D.   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 She now has an enlarging nodule at the inferior and lateral margin of the radiation field concerning for local recurrence. She presents for CT-guided biopsy followed by percutaneous thermal ablation if the biopsy is positive for recurrent malignancy.  EXAM: CT ABSCESS CATH EXCHANGE; CT BIOPSY  Date: 11/27/2013  PROCEDURE: 1. CT-guided lung biopsy 2. CT-guided placement of left chest tube Interventional Radiologist:  Criselda Peaches, MD  ANESTHESIA/SEDATION: Anesthesia provided by the anesthesiology service  TECHNIQUE: Informed consent was obtained from the patient following explanation of the procedure, risks, benefits and alternatives. The patient understands, agrees and consents for the procedure. All questions were addressed. A time out was performed.  A planning axial CT scan was performed. The left lower lobe pulmonary nodule  was easily identified. The mass has enlarged compared to the most recent prior PET-CT from 10/30/2013. Today, the mass measures between 3.5 and 3.2 cm x 3.2 cm compared to 2.8 x 2.4 cm on the prior PET-CT.  A suitable skin entry site was selected and marked. A 20 gauge trocar needle was advanced into the margin the mass. Multiple 22 gauge fine needle aspirates were then obtained. While awaiting the quit stain resolve from pathology, intermittent CT fluoroscopic imaging showed and development of intra procedural pneumothorax. A 7 [place] Yueh centesis catheter was advanced and the pleural space and connected to wall suction. The lung remains largely reinflated, however there was a near continuous air leak.  The biopsy result came back as positive for recurrent malignancy. Unfortunately, given the relatively rapid interval growth of the tumor, the utility of percutaneous thermal ablation is significantly less than what we had anticipated 2 weeks ago when I first saw her in clinic. I am afraid that the risks of the procedure now how with a benefits. Additionally, she has a new small left pleural effusion which is concerning for a malignant effusion given the large pleural abutment of the enlarging mass.  The percutaneous ablation procedure was therefore aborted. A 10 French chest tube was advanced into and apical and medial direction connected to wall  suction. A final axial CT scan and demonstrates complete re-expansion of the lung. The tube was secured in place and ale water tight sterile bandage was applied. The tube was secured to the skin with 0 Prolene suture.  IMPRESSION: 1. Rapid interval growth of the hypermetabolic left lower lobe pulmonary nodule. The nodule today measures between 3.2 and 3.5 x 3.2 cm compared to 2.8 x 2.4 cm on 10/30/2013. Additionally, there is more significant involvement of the adjacent pleural surface as well as a new small but likely malignant pleural effusion. 2. Percutaneous fine  needle aspiration biopsy confirmed recurrent malignancy. The FNA biopsy was complicated by intra procedural pneumothorax. 3. The percutaneous thermal ablation procedure was aborted secondary to the rapid interval growth of the lesion. The lesion with progressive pleural involvement and new likely malignant pleural effusion has exceeded the capability of thermal ablation therapy. 4. A 10 French percutaneous chest tube was placed for treatment of the biopsy related pneumothorax. The findings and plan were discussed both with the patient's daughter, and also with Dr. Tammi Klippel of Radiation Oncology. The results will also be communicated to the patient when she awakes from general anesthesia.  Signed,  Criselda Peaches, MD  [facility]  [facility]   Electronically Signed   By: Jacqulynn Cadet M.D.   On: 11/27/2013 14:36   Dg Chest Port 1 View  11/28/2013   CLINICAL DATA:  Pneumothorax.  Shortness of breath.  EXAM: PORTABLE CHEST - 1 VIEW  COMPARISON:  Chest x-ray 11/27/2013.  FINDINGS: Small bore left-sided chest tube in position, slightly retracted compared to the prior study. There continues to be a small left apical pneumothorax (less than 5% of the volume of the left hemithorax). Diffuse interstitial prominence in the lungs bilaterally appears to be chronic. Known left lower lobe mass again noted (see report from prior CT scan 10/05/2013 for full details). No pleural effusions. No evidence of pulmonary edema. Heart size is normal. The patient is rotated to the right on today's exam, resulting in distortion of the mediastinal contours and reduced diagnostic sensitivity and specificity for mediastinal pathology.  IMPRESSION: 1. Persistent trace left apical pneumothorax with small bore left-sided chest tube in position. 2. Patchy interstitial opacities throughout the lungs bilaterally appear to be chronic and are similar to multiple prior examinations. This likely in  part reflects underlying chronic bronchitis. 3. Left lower lobe mass redemonstrated, better depicted on prior chest CT.   Electronically Signed   By: Vinnie Langton M.D.   On: 11/28/2013 08:44   Dg Chest Port 1 View  11/27/2013   CLINICAL DATA:  LEFT Chest tube, eval PTX  EXAM: PORTABLE CHEST - 1 VIEW  COMPARISON:  Two-view chest 11/25/2013 chest CT 11/27/2013  FINDINGS: The heart size and mediastinal contours are within normal limits. A very small residual left apical pneumothorax is appreciated. A small bore chest tube coped centrally within the left hemi thorax. There is prominence of the interstitial markings. Linear areas of density within the lung bases. The bones are osteopenic. Mild degenerative changes within the shoulders. No acute osseous abnormalities.  IMPRESSION: Small residual left apical pneumothorax.  Scarring versus atelectasis in the lung bases.  Findings likely represent a chronic bronchitic changes.   Electronically Signed   By: Margaree Mackintosh M.D.   On: 11/27/2013 15:07    Assessment/Plan: (L)lung mass, s/p FNA and aborted ablation procedure Pathology pending Post procedure PTX with small bore pigtail chest tube insertion 6/26 Persistent PTX on  CXR Will encourage IS. Repeat CXR in am    LOS: 1 day    Ascencion Dike PA-C 11/28/2013 8:54 AM

## 2013-11-28 NOTE — Progress Notes (Signed)
UR Completed.  Brown, Sarah Jane [phone] 11/28/2013  

## 2013-11-29 ENCOUNTER — Inpatient Hospital Stay (HOSPITAL_COMMUNITY): Payer: Medicare Other

## 2013-11-29 MED ORDER — POLYETHYLENE GLYCOL 3350 17 G PO PACK
17.0000 g | PACK | Freq: Every day | ORAL | Status: DC | PRN
  Filled 2013-11-29: qty 1

## 2013-11-29 NOTE — Progress Notes (Signed)
Subjective: Pt feeling ok, still a little sore at chest tube sight. Denies SOB. States hasn't had a BM since admission  Objective: Physical Exam: BP 134/74  Pulse 67  Temp(Src) 98.2 F (36.8 C) (Oral)  Resp 16  Ht 5\' 4"  (1.626 m)  Wt 92 lb (41.731 kg)  BMI 15.78 kg/m2  SpO2 97% Lungs: CTA (B) (L)chest tube intact, site clean, mildly tender.  No air leak, serous output in drain tubing. CXR-possible tiny residual apical PTX, though report read as resolved.   Labs: CBC No results found for this basename: WBC, HGB, HCT, PLT,  in the last 72 hours BMET  Recent Labs  11/27/13 1044  NA 138  K 5.4*  CL 99  CO2 31  GLUCOSE 103*  BUN 14  CREATININE 0.46*  CALCIUM 9.3   LFT  Recent Labs  11/27/13 1044  PROT 7.4  ALBUMIN 3.5  AST 14  ALT 10  ALKPHOS 86  BILITOT 0.3   PT/INR No results found for this basename: LABPROT, INR,  in the last 72 hours   Studies/Results: Ct Abscess Cath Exchange  11/27/2013   CLINICAL DATA:  79 year old female with a history of left lower lobe squamous cell carcinoma status post external beam radiation therapy. She now has an enlarging nodule at the inferior and lateral margin of the radiation field concerning for local recurrence. She presents for CT-guided biopsy followed by percutaneous thermal ablation if the biopsy is positive for recurrent malignancy.  EXAM: CT ABSCESS CATH EXCHANGE; CT BIOPSY  Date: 11/27/2013  PROCEDURE: 1. CT-guided lung biopsy 2. CT-guided placement of left chest tube Interventional Radiologist:  Criselda Peaches, MD  ANESTHESIA/SEDATION: Anesthesia provided by the anesthesiology service  TECHNIQUE: Informed consent was obtained from the patient following explanation of the procedure, risks, benefits and alternatives. The patient understands, agrees and consents for the procedure. All questions were addressed. A time out was performed.  A planning axial CT scan was performed. The left lower lobe pulmonary nodule was  easily identified. The mass has enlarged compared to the most recent prior PET-CT from 10/30/2013. Today, the mass measures between 3.5 and 3.2 cm x 3.2 cm compared to 2.8 x 2.4 cm on the prior PET-CT.  A suitable skin entry site was selected and marked. A 20 gauge trocar needle was advanced into the margin the mass. Multiple 22 gauge fine needle aspirates were then obtained. While awaiting the quit stain resolve from pathology, intermittent CT fluoroscopic imaging showed and development of intra procedural pneumothorax. A 7 [place] Yueh centesis catheter was advanced and the pleural space and connected to wall suction. The lung remains largely reinflated, however there was a near continuous air leak.  The biopsy result came back as positive for recurrent malignancy. Unfortunately, given the relatively rapid interval growth of the tumor, the utility of percutaneous thermal ablation is significantly less than what we had anticipated 2 weeks ago when I first saw her in clinic. I am afraid that the risks of the procedure now how with a benefits. Additionally, she has a new small left pleural effusion which is concerning for a malignant effusion given the large pleural abutment of the enlarging mass.  The percutaneous ablation procedure was therefore aborted. A 10 French chest tube was advanced into and apical and medial direction connected to wall suction. A final axial CT scan and demonstrates complete re-expansion of the lung. The tube was secured in place and ale water tight sterile bandage was applied. The tube was secured  to the skin with 0 Prolene suture.  IMPRESSION: 1. Rapid interval growth of the hypermetabolic left lower lobe pulmonary nodule. The nodule today measures between 3.2 and 3.5 x 3.2 cm compared to 2.8 x 2.4 cm on 10/30/2013. Additionally, there is more significant involvement of the adjacent pleural surface as well as a new small but likely malignant pleural effusion. 2. Percutaneous fine needle  aspiration biopsy confirmed recurrent malignancy. The FNA biopsy was complicated by intra procedural pneumothorax. 3. The percutaneous thermal ablation procedure was aborted secondary to the rapid interval growth of the lesion. The lesion with progressive pleural involvement and new likely malignant pleural effusion has exceeded the capability of thermal ablation therapy. 4. A 10 French percutaneous chest tube was placed for treatment of the biopsy related pneumothorax. The findings and plan were discussed both with the patient's daughter, and also with Dr. Tammi Klippel of Radiation Oncology. The results will also be communicated to the patient when she awakes from general anesthesia.  Signed,  Criselda Peaches, MD  [facility]  [facility]   Electronically Signed   By: Jacqulynn Cadet M.D.   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 She now has an enlarging nodule at the inferior and lateral margin of the radiation field concerning for local recurrence. She presents for CT-guided biopsy followed by percutaneous thermal ablation if the biopsy is positive for recurrent malignancy.  EXAM: CT ABSCESS CATH EXCHANGE; CT BIOPSY  Date: 11/27/2013  PROCEDURE: 1. CT-guided lung biopsy 2. CT-guided placement of left chest tube Interventional Radiologist:  Criselda Peaches, MD  ANESTHESIA/SEDATION: Anesthesia provided by the anesthesiology service  TECHNIQUE: Informed consent was obtained from the patient following explanation of the procedure, risks, benefits and alternatives. The patient understands, agrees and consents for the procedure. All questions were addressed. A time out was performed.  A planning axial CT scan was performed. The left lower lobe pulmonary nodule was easily identified. The mass has enlarged compared to the  most recent prior PET-CT from 10/30/2013. Today, the mass measures between 3.5 and 3.2 cm x 3.2 cm compared to 2.8 x 2.4 cm on the prior PET-CT.  A suitable skin entry site was selected and marked. A 20 gauge trocar needle was advanced into the margin the mass. Multiple 22 gauge fine needle aspirates were then obtained. While awaiting the quit stain resolve from pathology, intermittent CT fluoroscopic imaging showed and development of intra procedural pneumothorax. A 7 [place] Yueh centesis catheter was advanced and the pleural space and connected to wall suction. The lung remains largely reinflated, however there was a near continuous air leak.  The biopsy result came back as positive for recurrent malignancy. Unfortunately, given the relatively rapid interval growth of the tumor, the utility of percutaneous thermal ablation is significantly less than what we had anticipated 2 weeks ago when I first saw her in clinic. I am afraid that the risks of the procedure now how with a benefits. Additionally, she has a new small left pleural effusion which is concerning for a malignant effusion given the large pleural abutment of the enlarging mass.  The percutaneous ablation procedure was therefore aborted. A 10 French chest tube was advanced into and apical and medial direction connected to wall suction. A final axial CT scan and demonstrates complete re-expansion of the lung. The tube was secured  in place and ale water tight sterile bandage was applied. The tube was secured to the skin with 0 Prolene suture.  IMPRESSION: 1. Rapid interval growth of the hypermetabolic left lower lobe pulmonary nodule. The nodule today measures between 3.2 and 3.5 x 3.2 cm compared to 2.8 x 2.4 cm on 10/30/2013. Additionally, there is more significant involvement of the adjacent pleural surface as well as a new small but likely malignant pleural effusion. 2. Percutaneous fine needle aspiration biopsy confirmed recurrent malignancy. The FNA  biopsy was complicated by intra procedural pneumothorax. 3. The percutaneous thermal ablation procedure was aborted secondary to the rapid interval growth of the lesion. The lesion with progressive pleural involvement and new likely malignant pleural effusion has exceeded the capability of thermal ablation therapy. 4. A 10 French percutaneous chest tube was placed for treatment of the biopsy related pneumothorax. The findings and plan were discussed both with the patient's daughter, and also with Dr. Tammi Klippel of Radiation Oncology. The results will also be communicated to the patient when she awakes from general anesthesia.  Signed,  Criselda Peaches, MD  [facility]  [facility]   Electronically Signed   By: Jacqulynn Cadet M.D.   On: 11/27/2013 14:36   Dg Chest Port 1 View  11/29/2013   CLINICAL DATA:  Followup left pneumothorax and chest tube.  EXAM: PORTABLE CHEST - 1 VIEW  COMPARISON:  Chest x-ray from yesterday.  FINDINGS: Left-sided chest tube is in similar position. A left apical pneumothorax is no longer visible.  Chronic lung disease with pulmonary hyperinflation and diffuse interstitial coarsening. Left basilar lung mass is not well defined by radiography. There is interval mild increase in surrounding reticular density.  No cardiomegaly.  Unchanged upper mediastinal contours.  IMPRESSION: 1. No visible pneumothorax. A left chest tube is in stable position. 2. Left lower lobe cancer with mild increase in surrounding atelectasis.   Electronically Signed   By: Jorje Guild M.D.   On: 11/29/2013 05:35   Dg Chest Port 1 View  11/28/2013   CLINICAL DATA:  Pneumothorax.  Shortness of breath.  EXAM: PORTABLE CHEST - 1 VIEW  COMPARISON:  Chest x-ray 11/27/2013.  FINDINGS: Small bore left-sided chest tube in position, slightly retracted compared to the prior study. There continues to be a small left apical pneumothorax (less than 5% of the volume of the  left hemithorax). Diffuse interstitial prominence in the lungs bilaterally appears to be chronic. Known left lower lobe mass again noted (see report from prior CT scan 10/05/2013 for full details). No pleural effusions. No evidence of pulmonary edema. Heart size is normal. The patient is rotated to the right on today's exam, resulting in distortion of the mediastinal contours and reduced diagnostic sensitivity and specificity for mediastinal pathology.  IMPRESSION: 1. Persistent trace left apical pneumothorax with small bore left-sided chest tube in position. 2. Patchy interstitial opacities throughout the lungs bilaterally appear to be chronic and are similar to multiple prior examinations. This likely in part reflects underlying chronic bronchitis. 3. Left lower lobe mass redemonstrated, better depicted on prior chest CT.   Electronically Signed   By: Vinnie Langton M.D.   On: 11/28/2013 08:44   Dg Chest Port 1 View  11/27/2013   CLINICAL DATA:  LEFT Chest tube, eval PTX  EXAM: PORTABLE CHEST - 1 VIEW  COMPARISON:  Two-view chest 11/25/2013 chest CT 11/27/2013  FINDINGS: The heart size and mediastinal contours are within normal limits. A very small residual  left apical pneumothorax is appreciated. A small bore chest tube coped centrally within the left hemi thorax. There is prominence of the interstitial markings. Linear areas of density within the lung bases. The bones are osteopenic. Mild degenerative changes within the shoulders. No acute osseous abnormalities.  IMPRESSION: Small residual left apical pneumothorax.  Scarring versus atelectasis in the lung bases.  Findings likely represent a chronic bronchitic changes.   Electronically Signed   By: Margaree Mackintosh M.D.   On: 11/27/2013 15:07    Assessment/Plan: (L)lung mass, s/p FNA and aborted ablation procedure Pathology/Cytology pending Post procedure PTX with small bore pigtail chest tube insertion 6/26 Since no air leak and equivocal finding on  CXR, will place chest tube to water seal today. CXR in am tomorrow, if stable, hopefully remove tube and DC home. Will allow OOB/walking today to see if helps bowel activity, otherwise order Miralax.    LOS: 2 days    Ascencion Dike PA-C 11/29/2013 8:30 AM

## 2013-11-30 ENCOUNTER — Inpatient Hospital Stay (HOSPITAL_COMMUNITY): Payer: Medicare Other

## 2013-11-30 ENCOUNTER — Telehealth: Payer: Self-pay | Admitting: Radiation Oncology

## 2013-11-30 MED ORDER — DSS 100 MG PO CAPS: 100.0000 mg | ORAL_CAPSULE | Freq: Two times a day (BID) | ORAL | Status: DC

## 2013-11-30 MED ORDER — ENSURE COMPLETE PO LIQD: 237.0000 mL | Freq: Three times a day (TID) | ORAL | Status: DC

## 2013-11-30 MED ORDER — IPRATROPIUM-ALBUTEROL 0.5-2.5 (3) MG/3ML IN SOLN
3.0000 mL | Freq: Four times a day (QID) | RESPIRATORY_TRACT | Status: DC
  Filled 2013-11-30: qty 3

## 2013-11-30 MED ORDER — ENSURE COMPLETE PO LIQD: 237.0000 mL | Freq: Two times a day (BID) | ORAL | Status: DC

## 2013-11-30 MED ORDER — ENSURE COMPLETE PO LIQD
237.0000 mL | Freq: Three times a day (TID) | ORAL | Status: DC
  Administered 2013-11-30: 237 mL via ORAL

## 2013-11-30 MED ORDER — IPRATROPIUM-ALBUTEROL 20-100 MCG/ACT IN AERS: 1.0000 | INHALATION_SPRAY | Freq: Four times a day (QID) | RESPIRATORY_TRACT | Status: DC

## 2013-11-30 NOTE — Progress Notes (Signed)
[facility]   [facility] is providing the following services: Oxygen  If patient discharges after hours, please call [phone].   Linward Headland 11/30/2013, 3:23 PM

## 2013-11-30 NOTE — Progress Notes (Signed)
Patient 's oxygen on RA at rest was 86-88%. [name] RN

## 2013-11-30 NOTE — Discharge Summary (Signed)
Physician Discharge Summary  Patient ID: Meira Wahba MRN: [id number] DOB/AGE: [birth year] 79 y.o.  Admit date: 11/27/2013 Discharge date: 11/30/2013  Admission Diagnoses: Active Problems:   Local recurrence of left lung cancer  Discharge Diagnoses:  Active Problems:   Local recurrence of left lung cancer    Procedures: Procedure(s): S/p LLL lung mass FNA with general anesthesia complicated by PTX S/p 62X chest tube placement and removal.   Discharged Condition: Good, stable.   Hospital Course: Jonnelle Lawniczak is an 79 y.o. 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 She underwent definitive stereotactic body radiotherapy with 50 Gy divided over 5 fractions completed February 27, 2013. On subsequent surveillance imaging she was found to have an enlarging soft tissue nodule at the inferior and lateral aspect of the treated mass. PET CT imaging confirmed hypermetabolic activity with an SUV maximum of 8.1. The nodule measures 2.5 x 2.8 cm with circumscribed margins highly concerning for local recurrence. The patient was seen in consult 11/18/13 to discuss treatment options. She was scheduled 11/27/13 for an image guided left lower lung mass FNA to confirm recurrence followed by thermal ablation under general anesthesia.   The patient was intubated and underwent a CT guided LLL lung mass 22G FNA that was positive for recurrent malignancy, she had an immediate post biopsy PTX and required a 24F chest tube. During the LLL lung mass biopsy, it was noted that the patient's lung nodule had increased in size from previous imaging and given that growth the benefits of the thermal ablation no longer out weigh the risks associated, the thermal ablation was cancelled. The patient was extubated.  The patient was admitted with a left 24F chest tube with serial vitals and imaging. The left chest tube was placed to water seal on 6/28 and follow up CXR 6/29 was stable with no PTX, the  left chest tube was then successfully removed at bedside on 6/29 with no immediate complications. Post chest tube removal imaging was stable with no PTX, the patient was weaned off of her oxygen with RA saturation of 86-88% and care management was consulted for home health care services to provide oxygen. Care management has arranged home services and nutritional management has also evaluated the patient today.   The patient denies any worsening shortness of breath than her chronic symptoms. She does admit to some left previous chest tube site tenderness, but denies any chest pain. She denies any active bleeding, fever or chills. She denies any lightheadedness or dizziness. She states she would like to be discharged home and that her daughter can pick her up and that her spouse lives with her.   Consults: Anesthesia, care management, nutrition.  Discharge Exam: Blood pressure 136/45, pulse 91, temperature 98.6 F (37 C), temperature source Oral, resp. rate 16, height 5\' 3"  (1.6 m), weight 93 lb 8 oz (42.411 kg), SpO2 99.00%.  PE: General: A&Ox3, NAD, sitting up in chair Heart: RRR without M/G/R Lungs: CTA bilaterally Abd: Soft, NT, ND, (+) BS Ext: FROM without edema  Disposition:   Discharge Instructions   Call MD for:  difficulty breathing, headache or visual disturbances    Complete by:  As directed      Call MD for:  extreme fatigue    Complete by:  As directed      Call MD for:  hives    Complete by:  As directed      Call MD for:  persistant dizziness or light-headedness  Complete by:  As directed      Call MD for:  persistant nausea and vomiting    Complete by:  As directed      Call MD for:  redness, tenderness, or signs of infection (pain, swelling, redness, odor or green/yellow discharge around incision site)    Complete by:  As directed      Call MD for:  severe uncontrolled pain    Complete by:  As directed      Call MD for:  temperature >100.4    Complete by:  As  directed      Diet - low sodium heart healthy    Complete by:  As directed      Driving Restrictions    Complete by:  As directed   No driving     Increase activity slowly    Complete by:  As directed      Lifting restrictions    Complete by:  As directed   No lifting over 10 lbs x 3 days     Other Restrictions    Complete by:  As directed   Patient to be wearing oxygen at home to maintain oxygen saturation >90%, care management has arranged home health care     Remove dressing in 24 hours    Complete by:  As directed             Medication List         CALCIUM-MAGNESIUM-ZINC PO  Take 1 tablet by mouth daily.     COMBIVENT RESPIMAT 20-100 MCG/ACT Aers respimat  Generic drug:  Ipratropium-Albuterol  Inhale 1 puff into the lungs 4 (four) times daily as needed for wheezing or shortness of breath.     diltiazem 120 MG 24 hr capsule  Commonly known as:  DILACOR XR  Take 120 mg by mouth every morning.     DSS 100 MG Caps  Take 100 mg by mouth 2 (two) times daily.     feeding supplement (ENSURE COMPLETE) Liqd  Take 237 mLs by mouth 3 (three) times daily between meals.           Follow-up Information   Follow up with [facility]. (home oxygen)    Contact information:   [address] [place] [address] [phone]       Follow up with [facility]. (nurse to monitor oxygen)    Contact information:   [address] [place] [address] [phone]       Follow up with [name], MD In 1 week.   Specialty:  Internal Medicine   Contact information:   [address] [place] [address] [phone]       Call Lora Paula, MD.   Specialty:  Radiation Oncology   Contact information:   [address] [place] [address] [phone]       Signed: Rockney Ghee 11/30/2013, 2:41 PM

## 2013-11-30 NOTE — Progress Notes (Signed)
Patient d/c instructions given to patient's daughter, verbalized understanding,prescriptions given to daughter. Told her that [facility] will be calling her to set-up mothers oxygen at home per Manuela Schwartz CM. Patient went home with oxygen at 2L per nasal cannula, stable,denies pain. Iv site unremarkable.[name] RN
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
## 2013-11-30 NOTE — Telephone Encounter (Signed)
Phoned patient's hospital room making her aware of her upcoming CT simulation appointment. Also, mailed appointment card to the patient's home. Understanding verbalized.

## 2013-11-30 NOTE — Progress Notes (Signed)
Received referral from inpatient RNCM for [facility] services. Patient scheduled for discharge. Therefore, called into room to speak with patient before she left hospital. Patient did not want to talk about [facility] and asked that writer speak with her daughter about services. Spoke with daughter who states it is not a good time to talk and to call when patient gets home. Will make inpatient RNCM aware. Will attempt to contact patient or daughter at home after discharge.  Marthenia Rolling, [facility] HENIDPO-[phone]

## 2013-11-30 NOTE — Care Management Note (Signed)
    Page 1 of 1   11/30/2013     2:11:20 PM CARE MANAGEMENT NOTE 11/30/2013  Patient:  [name], [name]   Account Number:  [id number]  Date Initiated:  11/30/2013  Documentation initiated by:  Sunday Spillers  Subjective/Objective Assessment:   79 yo female admitted s/p lung bx with PTX. PTA lived at home with spouse.     Action/Plan:   Home when stable   Anticipated DC Date:  12/03/2013   Anticipated DC Plan:  [place]  CM consult      PAC Choice  [facility]   Choice offered to / List presented to:  C-1 Patient   DME arranged  [facility]      DME agency  [place] arranged  HH-1 RN  [place].   Status of service:  Completed, signed off Medicare Important Message given?  NA - LOS <3 / Initial given by admissions (If response is "NO", the following Medicare IM given date fields will be blank) Date Medicare IM given:   Date Additional Medicare IM given:    Discharge Disposition:  [facility]  Per UR Regulation:  Reviewed for med. necessity/level of care/duration of stay  If discussed at [place] of Stay Meetings, dates discussed:    Comments:

## 2013-11-30 NOTE — Telephone Encounter (Signed)
Patient requested this writer call her daughter and inform her of her upcoming CT simulation appointment. No answer. Left message.

## 2013-11-30 NOTE — Progress Notes (Signed)
Patient ambulated with NT and oxygen was checked on RA,it was 84%. Will call CM for home oxygen per MD order.- [name] RN

## 2013-11-30 NOTE — Progress Notes (Signed)
CXR this morning reviewed by Dr. Anselm Pancoast and recommended left sided chest tube removal today with CXR 2 hours after removal. Successful removal of left sided chest tube today at bedside without immediate complications. Vaseline guaze and sterile dressing applied. CXR ordered for 1130 today.  Tsosie Billing PA-C Interventional Radiology  11/30/13  9:36 AM

## 2013-12-01 ENCOUNTER — Other Ambulatory Visit (HOSPITAL_COMMUNITY): Payer: Self-pay | Admitting: Interventional Radiology

## 2013-12-01 DIAGNOSIS — C3490 Malignant neoplasm of unspecified part of unspecified bronchus or lung: Secondary | ICD-10-CM

## 2013-12-11 ENCOUNTER — Ambulatory Visit: Payer: Medicare Other | Admitting: Radiation Oncology

## 2013-12-16 ENCOUNTER — Encounter: Payer: Self-pay | Admitting: Radiation Oncology

## 2013-12-16 NOTE — Progress Notes (Signed)
  Radiation Oncology         [phone] ________________________________  Name: [name] MRN: [id number]  Date: 12/17/2013  DOB: July 25, 1934  SIMULATION AND TREATMENT PLANNING NOTE  DIAGNOSIS:  79 yo woman with local recurrence of left lower lung cancer s/p previous SBRT  NARRATIVE:  The patient was brought to the [facility].  Identity was confirmed.  All relevant records and images related to the planned course of therapy were reviewed.  The patient freely provided informed written consent to proceed with treatment after reviewing the details related to the planned course of therapy. The consent form was witnessed and verified by the simulation staff.  Then, the patient was set-up in a stable reproducible  supine position for radiation therapy.  CT images were obtained.  Surface markings were placed.  The CT images were loaded into the planning software.  Then the target and avoidance structures were contoured.  Treatment planning then occurred.  The radiation prescription was entered and confirmed.  Then, I designed and supervised the construction of a total of 5 medically necessary complex treatment devices.  I have requested : 3D Simulation  I have requested a DVH of the following structures: heart, spinal cord, lungs, and target volume.  I have ordered:Nutrition Consult  RESPIRATORY MOTION MANAGEMENT:  In order to account for effect of respiratory motion on target structures and other organs in the planning and delivery of radiotherapy, this patient underwent respiratory motion management simulation.  To accomplish this, when the patient was brought to the CT simulation planning suite, 4D respiratoy motion management CT images were obtained.  The CT images were loaded into the planning software.  Then, using a variety of tools including Cine, MIP, and standard views, the target volume and planning target volumes (PTV) were delineated.  Avoidance structures were contoured.     PLAN:  The patient will receive 66 Gy in 33 fractions.  ________________________________  Sheral Apley Tammi Klippel, M.D.

## 2013-12-17 ENCOUNTER — Ambulatory Visit
Admission: RE | Admit: 2013-12-17 | Discharge: 2013-12-17 | Disposition: A | Discharge status: Home / Self Care | Payer: Medicare Other | Source: Ambulatory Visit | Attending: Radiation Oncology | Admitting: Radiation Oncology

## 2013-12-17 ENCOUNTER — Encounter: Payer: Self-pay | Admitting: Radiation Oncology

## 2013-12-17 DIAGNOSIS — C3492 Malignant neoplasm of unspecified part of left bronchus or lung: Secondary | ICD-10-CM

## 2013-12-17 DIAGNOSIS — R05 Cough: Secondary | ICD-10-CM | POA: Diagnosis not present

## 2013-12-17 DIAGNOSIS — Z923 Personal history of irradiation: Secondary | ICD-10-CM | POA: Diagnosis not present

## 2013-12-17 DIAGNOSIS — C3490 Malignant neoplasm of unspecified part of unspecified bronchus or lung: Secondary | ICD-10-CM | POA: Diagnosis not present

## 2013-12-17 DIAGNOSIS — R0602 Shortness of breath: Secondary | ICD-10-CM | POA: Diagnosis not present

## 2013-12-17 DIAGNOSIS — C3432 Malignant neoplasm of lower lobe, left bronchus or lung: Secondary | ICD-10-CM

## 2013-12-17 DIAGNOSIS — R059 Cough, unspecified: Secondary | ICD-10-CM | POA: Diagnosis not present

## 2013-12-17 DIAGNOSIS — Z510 Encounter for antineoplastic radiation therapy: Secondary | ICD-10-CM | POA: Diagnosis not present

## 2013-12-18 ENCOUNTER — Encounter: Payer: Self-pay | Admitting: *Deleted

## 2013-12-18 DIAGNOSIS — Z510 Encounter for antineoplastic radiation therapy: Secondary | ICD-10-CM | POA: Diagnosis not present

## 2013-12-18 NOTE — Progress Notes (Signed)
[place] Work  Clinical Social Work was referred by nurse for assessment of psychosocial needs due to transportation concerns.  Clinical Social Worker phoned patient at home to offer support and assess for needs. Pt reports her daughter can take her to appointments, but charges her $20 for each ride. Pt reports that is what she always does. CSW inquired if daughter would take her with out payment and pt reports she would ask, but feels she should pay her. CSW discussed other options [facility], [employer] and possibility of gas card to assist daughter with gas. Pt plans to call [facility] for assistance and feels she can accomplish this task without assistance. Pt reports she does not have mobility issues and no medicaid. Pt stated she may still get daughter to take her to appointments. Pt declined other concerns currently, reports to be coping well, denies food insecurity/financial concerns. Pt agrees to reach out to CSW if other needs arise.      Clinical Social Work interventions: [employer] account planner Education  Loren Racer, LCSW Clinical Social Worker [facility] for [place] Wednesday, Thursday and Friday Phone: [phone] Fax: [phone]

## 2013-12-21 DIAGNOSIS — Z510 Encounter for antineoplastic radiation therapy: Secondary | ICD-10-CM | POA: Diagnosis not present

## 2013-12-24 ENCOUNTER — Ambulatory Visit: Payer: Medicare Other | Admitting: Radiation Oncology

## 2013-12-28 ENCOUNTER — Ambulatory Visit
Admission: RE | Admit: 2013-12-28 | Discharge: 2013-12-28 | Disposition: A | Discharge status: Home / Self Care | Payer: Medicare Other | Source: Ambulatory Visit | Attending: Radiation Oncology | Admitting: Radiation Oncology

## 2013-12-28 DIAGNOSIS — Z510 Encounter for antineoplastic radiation therapy: Secondary | ICD-10-CM | POA: Diagnosis not present

## 2013-12-29 ENCOUNTER — Ambulatory Visit
Admission: RE | Admit: 2013-12-29 | Discharge: 2013-12-29 | Disposition: A | Discharge status: Home / Self Care | Payer: Medicare Other | Source: Ambulatory Visit | Attending: Radiation Oncology | Admitting: Radiation Oncology

## 2013-12-29 DIAGNOSIS — Z510 Encounter for antineoplastic radiation therapy: Secondary | ICD-10-CM | POA: Diagnosis not present

## 2013-12-30 ENCOUNTER — Encounter: Payer: Self-pay | Admitting: Nutrition

## 2013-12-30 ENCOUNTER — Ambulatory Visit
Admission: RE | Admit: 2013-12-30 | Discharge: 2013-12-30 | Disposition: A | Discharge status: Home / Self Care | Payer: Medicare Other | Source: Ambulatory Visit | Attending: Radiation Oncology | Admitting: Radiation Oncology

## 2013-12-30 DIAGNOSIS — Z510 Encounter for antineoplastic radiation therapy: Secondary | ICD-10-CM | POA: Diagnosis not present

## 2013-12-30 NOTE — Progress Notes (Signed)
Provided second case of Ensure Plus for patient at her request.

## 2013-12-31 ENCOUNTER — Ambulatory Visit
Admission: RE | Admit: 2013-12-31 | Discharge: 2013-12-31 | Disposition: A | Discharge status: Home / Self Care | Payer: Medicare Other | Source: Ambulatory Visit | Attending: Radiation Oncology | Admitting: Radiation Oncology

## 2013-12-31 ENCOUNTER — Encounter: Payer: Self-pay | Admitting: Radiation Oncology

## 2013-12-31 VITALS — BP 125/50 | HR 84 | Temp 97.8°F | Wt 99.4 lb

## 2013-12-31 DIAGNOSIS — Z510 Encounter for antineoplastic radiation therapy: Secondary | ICD-10-CM | POA: Diagnosis not present

## 2013-12-31 DIAGNOSIS — C3432 Malignant neoplasm of lower lobe, left bronchus or lung: Secondary | ICD-10-CM

## 2013-12-31 DIAGNOSIS — C3492 Malignant neoplasm of unspecified part of left bronchus or lung: Secondary | ICD-10-CM

## 2013-12-31 MED ORDER — RADIAPLEXRX EX GEL
Freq: Once | CUTANEOUS | Status: AC
  Administered 2013-12-31: 16:00:00 via TOPICAL

## 2013-12-31 NOTE — Progress Notes (Signed)
  Radiation Oncology         [phone] ________________________________  Name: [name] MRN: [id number]  Date: 12/31/2013  DOB: 1934-07-01  Weekly Radiation Therapy Management  Current Dose: 8 Gy     Planned Dose:  66 Gy  Narrative . . . . . . . . The patient presents for routine under treatment assessment.                                   The patient is without complaint.                                 Set-up films were reviewed.                                 The chart was checked. Physical Findings. . .  weight is 99 lb 6.4 oz (45.088 kg). Her temperature is 97.8 F (36.6 C). Her blood pressure is 125/50 and her pulse is 84. Her oxygen saturation is 95%. . Weight essentially stable.  No significant changes. Impression . . . . . . . The patient is tolerating radiation. Plan . . . . . . . . . . . . Continue treatment as planned.  ________________________________  Sheral Apley. Tammi Klippel, M.D.

## 2013-12-31 NOTE — Progress Notes (Signed)
Patient for weekly assessment of radiation to left lung.Will complete 4 of 33 treatments today.Routine of clinic reviewed.Given Radiation Therapy and You Booklet, skin care sheet and radiaplex.Reviewed possible side effects to include cough, shortness of breath, fatigue and skin discoloration.Informed to apply radiaplex twice daily when skin changes occur.Discussed whether to hold dilacor XR.Informed if systolic BP less than 722 to inform cardiologist.Push po fluids to avoid dehydration and continue with ensure qid.Weight is up 4 lbs in last month.Activity is good with in normal limits of how she feels.

## 2014-01-01 ENCOUNTER — Ambulatory Visit
Admission: RE | Admit: 2014-01-01 | Discharge: 2014-01-01 | Disposition: A | Discharge status: Home / Self Care | Payer: Medicare Other | Source: Ambulatory Visit | Attending: Radiation Oncology | Admitting: Radiation Oncology

## 2014-01-01 DIAGNOSIS — Z510 Encounter for antineoplastic radiation therapy: Secondary | ICD-10-CM | POA: Diagnosis not present

## 2014-01-04 ENCOUNTER — Ambulatory Visit
Admission: RE | Admit: 2014-01-04 | Discharge: 2014-01-04 | Disposition: A | Discharge status: Home / Self Care | Payer: Medicare Other | Source: Ambulatory Visit | Attending: Radiation Oncology | Admitting: Radiation Oncology

## 2014-01-04 DIAGNOSIS — Z510 Encounter for antineoplastic radiation therapy: Secondary | ICD-10-CM | POA: Diagnosis not present

## 2014-01-05 ENCOUNTER — Ambulatory Visit
Admission: RE | Admit: 2014-01-05 | Discharge: 2014-01-05 | Disposition: A | Discharge status: Home / Self Care | Payer: Medicare Other | Source: Ambulatory Visit | Attending: Radiation Oncology | Admitting: Radiation Oncology

## 2014-01-05 DIAGNOSIS — Z510 Encounter for antineoplastic radiation therapy: Secondary | ICD-10-CM | POA: Diagnosis not present

## 2014-01-06 ENCOUNTER — Ambulatory Visit
Admission: RE | Admit: 2014-01-06 | Discharge: 2014-01-06 | Disposition: A | Discharge status: Home / Self Care | Payer: Medicare Other | Source: Ambulatory Visit | Attending: Radiation Oncology | Admitting: Radiation Oncology

## 2014-01-06 DIAGNOSIS — Z510 Encounter for antineoplastic radiation therapy: Secondary | ICD-10-CM | POA: Diagnosis not present

## 2014-01-07 ENCOUNTER — Ambulatory Visit
Admission: RE | Admit: 2014-01-07 | Discharge: 2014-01-07 | Disposition: A | Discharge status: Home / Self Care | Payer: Medicare Other | Source: Ambulatory Visit | Attending: Radiation Oncology | Admitting: Radiation Oncology

## 2014-01-07 VITALS — BP 150/64 | HR 101 | Wt 99.8 lb

## 2014-01-07 DIAGNOSIS — Z510 Encounter for antineoplastic radiation therapy: Secondary | ICD-10-CM | POA: Diagnosis not present

## 2014-01-07 DIAGNOSIS — C3432 Malignant neoplasm of lower lobe, left bronchus or lung: Secondary | ICD-10-CM

## 2014-01-07 NOTE — Progress Notes (Signed)
Patient reports fatigue. Reports shortness of breath is no worse or better. Reports a persistent dry cough. Denies skin changes within treatment field. Blood pressure and heart rate slightly elevated. Reports drinking 3-4 ensures per day. 4 lb weight gain noted since last week. Denies chest pain or difficulty swallowing.

## 2014-01-07 NOTE — Progress Notes (Signed)
   Department of Radiation Oncology  Phone:  [phone] Fax:        [phone]  Weekly Treatment Note    Name: [name] Date: 01/07/2014 MRN: [id number] DOB: 05/06/1935   Current dose: 18 Gy  Current fraction: 9   MEDICATIONS: Current Outpatient Prescriptions  Medication Sig Dispense Refill  . CALCIUM-MAGNESIUM-ZINC PO Take 1 tablet by mouth daily.      [name] diltiazem (DILACOR XR) 120 MG 24 hr capsule Take 120 mg by mouth every morning.      . docusate sodium 100 MG CAPS Take 100 mg by mouth 2 (two) times daily.  30 capsule  0  . feeding supplement, ENSURE COMPLETE, (ENSURE COMPLETE) LIQD Take 237 mLs by mouth 3 (three) times daily between meals.  30 Bottle  1   No current facility-administered medications for this encounter.     ALLERGIES: Iohexol   LABORATORY DATA:  Lab Results  Component Value Date   WBC 9.3 11/25/2013   HGB 12.7 11/25/2013   HCT 39.7 11/25/2013   MCV 98.0 11/25/2013   PLT 404* 11/25/2013   Lab Results  Component Value Date   NA 138 11/27/2013   K 5.4* 11/27/2013   CL 99 11/27/2013   CO2 31 11/27/2013   Lab Results  Component Value Date   ALT 10 11/27/2013   AST 14 11/27/2013   ALKPHOS 86 11/27/2013   BILITOT 0.3 11/27/2013     NARRATIVE: [name] was seen today for weekly treatment management. The chart was checked and the patient's films were reviewed. The patient is seen today in clinic complaining of an ongoing cough. This is been a dry cough almost always, a couple of episodes which were productive. No complaints of hemoptysis. No fever. The patient states that she has not taken anything for this as of yet.  PHYSICAL EXAMINATION: weight is 99 lb 12.8 oz (45.269 kg). Her blood pressure is 150/64 and her pulse is 101. Her oxygen saturation is 97%.        ASSESSMENT: The patient is doing satisfactorily with treatment.   PLAN: We will continue with the patient's radiation treatment as planned. She has a dry cough and I recommended for the  patient to begin Robitussin cough over-the-counter. She will let us know if this is not adequate. I recommended that she take this at night but to try to minimize the usage of this during the day if possible.

## 2014-01-08 ENCOUNTER — Ambulatory Visit
Admission: RE | Admit: 2014-01-08 | Discharge: 2014-01-08 | Disposition: A | Discharge status: Home / Self Care | Payer: Medicare Other | Source: Ambulatory Visit | Attending: Radiation Oncology | Admitting: Radiation Oncology

## 2014-01-08 DIAGNOSIS — Z510 Encounter for antineoplastic radiation therapy: Secondary | ICD-10-CM | POA: Diagnosis not present

## 2014-01-11 ENCOUNTER — Ambulatory Visit
Admission: RE | Admit: 2014-01-11 | Discharge: 2014-01-11 | Disposition: A | Discharge status: Home / Self Care | Payer: Medicare Other | Source: Ambulatory Visit | Attending: Radiation Oncology | Admitting: Radiation Oncology

## 2014-01-11 DIAGNOSIS — Z510 Encounter for antineoplastic radiation therapy: Secondary | ICD-10-CM | POA: Diagnosis not present

## 2014-01-12 ENCOUNTER — Ambulatory Visit
Admission: RE | Admit: 2014-01-12 | Discharge: 2014-01-12 | Disposition: A | Discharge status: Home / Self Care | Payer: Medicare Other | Source: Ambulatory Visit | Attending: Radiation Oncology | Admitting: Radiation Oncology

## 2014-01-12 DIAGNOSIS — Z510 Encounter for antineoplastic radiation therapy: Secondary | ICD-10-CM | POA: Diagnosis not present

## 2014-01-13 ENCOUNTER — Ambulatory Visit
Admission: RE | Admit: 2014-01-13 | Discharge: 2014-01-13 | Disposition: A | Discharge status: Home / Self Care | Payer: Medicare Other | Source: Ambulatory Visit | Attending: Radiation Oncology | Admitting: Radiation Oncology

## 2014-01-13 DIAGNOSIS — Z510 Encounter for antineoplastic radiation therapy: Secondary | ICD-10-CM | POA: Diagnosis not present

## 2014-01-14 ENCOUNTER — Ambulatory Visit
Admission: RE | Admit: 2014-01-14 | Discharge: 2014-01-14 | Disposition: A | Discharge status: Home / Self Care | Payer: Medicare Other | Source: Ambulatory Visit | Attending: Radiation Oncology | Admitting: Radiation Oncology

## 2014-01-14 DIAGNOSIS — Z510 Encounter for antineoplastic radiation therapy: Secondary | ICD-10-CM | POA: Diagnosis not present

## 2014-01-15 ENCOUNTER — Ambulatory Visit
Admission: RE | Admit: 2014-01-15 | Discharge: 2014-01-15 | Disposition: A | Discharge status: Home / Self Care | Payer: Medicare Other | Source: Ambulatory Visit | Attending: Radiation Oncology | Admitting: Radiation Oncology

## 2014-01-15 ENCOUNTER — Encounter: Payer: Self-pay | Admitting: Radiation Oncology

## 2014-01-15 VITALS — BP 112/58 | HR 79 | Temp 97.9°F | Resp 20 | Wt 98.6 lb

## 2014-01-15 DIAGNOSIS — C3432 Malignant neoplasm of lower lobe, left bronchus or lung: Secondary | ICD-10-CM

## 2014-01-15 DIAGNOSIS — Z510 Encounter for antineoplastic radiation therapy: Secondary | ICD-10-CM | POA: Diagnosis not present

## 2014-01-15 MED ORDER — BENZONATATE 200 MG PO CAPS: 200.0000 mg | ORAL_CAPSULE | Freq: Three times a day (TID) | ORAL | Status: DC | PRN

## 2014-01-15 NOTE — Progress Notes (Signed)
   Department of Radiation Oncology  Phone:  [phone] Fax:        [phone]  Weekly Treatment Note    Name: [name] Date: 01/15/2014 MRN: [id number] DOB: September 17, 1934   Current dose: 30 Gy  Current fraction: 15   MEDICATIONS: Current Outpatient Prescriptions  Medication Sig Dispense Refill  . hyaluronate sodium (RADIAPLEXRX) GEL Apply 1 application topically daily.      [name] CALCIUM-MAGNESIUM-ZINC PO Take 1 tablet by mouth daily.      [name] diltiazem (DILACOR XR) 120 MG 24 hr capsule Take 120 mg by mouth every morning.      . docusate sodium 100 MG CAPS Take 100 mg by mouth 2 (two) times daily.  30 capsule  0  . feeding supplement, ENSURE COMPLETE, (ENSURE COMPLETE) LIQD Take 237 mLs by mouth 3 (three) times daily between meals.  30 Bottle  1   No current facility-administered medications for this encounter.     ALLERGIES: Iohexol   LABORATORY DATA:  Lab Results  Component Value Date   WBC 9.3 11/25/2013   HGB 12.7 11/25/2013   HCT 39.7 11/25/2013   MCV 98.0 11/25/2013   PLT 404* 11/25/2013   Lab Results  Component Value Date   Cynthia 138 11/27/2013   K 5.4* 11/27/2013   CL 99 11/27/2013   CO2 31 11/27/2013   Lab Results  Component Value Date   ALT 10 11/27/2013   AST 14 11/27/2013   ALKPHOS 86 11/27/2013   BILITOT 0.3 11/27/2013     NARRATIVE: Nao Rocha was seen today for weekly treatment management. The chart was checked and the patient's films were reviewed. The patient states that she continues to experience a significant cough. Over-the-counter cough medicine which she obtained after our last discussion has not been working adequately. She does request a prescription for this at this time. Otherwise the patient is doing well. No esophagitis. No other complaints in the chest region.  PHYSICAL EXAMINATION: weight is 98 lb 9.6 oz (44.725 kg). Her oral temperature is 97.9 F (36.6 C). Her blood pressure is 112/58 and her pulse is 79. Her respiration is 20 and oxygen  saturation is 94%.      skin shows minimal radiation effect  ASSESSMENT: The patient is doing satisfactorily with treatment.  PLAN: We will continue with the patient's radiation treatment as planned. The patient has been given a prescription for Tessalon. She is to let us know if this does not sufficiently help her cough.

## 2014-01-15 NOTE — Progress Notes (Addendum)
Weekly rad txs, left lung,  15/33 completed, very slight skin  Erythema, skin intact,  so far will start using radaiplex today, very weak, bough cough medicaine not robitussin hasn't helped, would like rx cough medication, coughs up  Clear phelgm,mostly in am,  94% room air,  Wears oxygen at night, no difficulty swallowing food, drinks 3 cans ensure daily, no appetite,  Loss 1 lb  1:54 PM

## 2014-01-18 ENCOUNTER — Ambulatory Visit
Admission: RE | Admit: 2014-01-18 | Discharge: 2014-01-18 | Disposition: A | Discharge status: Home / Self Care | Payer: Medicare Other | Source: Ambulatory Visit | Attending: Radiation Oncology | Admitting: Radiation Oncology

## 2014-01-18 DIAGNOSIS — Z510 Encounter for antineoplastic radiation therapy: Secondary | ICD-10-CM | POA: Diagnosis not present

## 2014-01-19 ENCOUNTER — Ambulatory Visit
Admission: RE | Admit: 2014-01-19 | Discharge: 2014-01-19 | Disposition: A | Discharge status: Home / Self Care | Payer: Medicare Other | Source: Ambulatory Visit | Attending: Radiation Oncology | Admitting: Radiation Oncology

## 2014-01-19 DIAGNOSIS — Z510 Encounter for antineoplastic radiation therapy: Secondary | ICD-10-CM | POA: Diagnosis not present

## 2014-01-20 ENCOUNTER — Ambulatory Visit
Admission: RE | Admit: 2014-01-20 | Discharge: 2014-01-20 | Disposition: A | Discharge status: Home / Self Care | Payer: Medicare Other | Source: Ambulatory Visit | Attending: Radiation Oncology | Admitting: Radiation Oncology

## 2014-01-20 DIAGNOSIS — Z510 Encounter for antineoplastic radiation therapy: Secondary | ICD-10-CM | POA: Diagnosis not present

## 2014-01-21 ENCOUNTER — Encounter: Payer: Self-pay | Admitting: Nutrition

## 2014-01-21 ENCOUNTER — Ambulatory Visit
Admission: RE | Admit: 2014-01-21 | Discharge: 2014-01-21 | Disposition: A | Discharge status: Home / Self Care | Payer: Medicare Other | Source: Ambulatory Visit | Attending: Radiation Oncology | Admitting: Radiation Oncology

## 2014-01-21 DIAGNOSIS — Z510 Encounter for antineoplastic radiation therapy: Secondary | ICD-10-CM | POA: Diagnosis not present

## 2014-01-21 NOTE — Progress Notes (Signed)
Provided patient with 3rd and final case of Ensure Plus.

## 2014-01-22 ENCOUNTER — Ambulatory Visit
Admission: RE | Admit: 2014-01-22 | Discharge: 2014-01-22 | Disposition: A | Discharge status: Home / Self Care | Payer: Medicare Other | Source: Ambulatory Visit | Attending: Radiation Oncology | Admitting: Radiation Oncology

## 2014-01-22 VITALS — BP 132/47 | HR 92 | Temp 97.5°F | Resp 12 | Wt 98.4 lb

## 2014-01-22 DIAGNOSIS — Z510 Encounter for antineoplastic radiation therapy: Secondary | ICD-10-CM | POA: Diagnosis not present

## 2014-01-22 DIAGNOSIS — C3430 Malignant neoplasm of lower lobe, unspecified bronchus or lung: Secondary | ICD-10-CM

## 2014-01-22 NOTE — Progress Notes (Signed)
She is currently in no pain. Pt complains of sore throat, Chills, Loss of Sleep, Fatigue, Generalized Weakness and Poor Appetite, reports she drinks approx 3 Ensure a day. Shortness of Breath when Walking and climbing Stairs and Coughing  Productive and Color of Phlegm is  Clear . Pt is on 2 liters/min via nasal cannula only at night, she is currently on room air. Noted a small amount of erythema over breast bone. Pt denies dysphagia.

## 2014-01-24 ENCOUNTER — Encounter: Payer: Self-pay | Admitting: Radiation Oncology

## 2014-01-24 NOTE — Progress Notes (Signed)
  Radiation Oncology         [phone] ________________________________  Name: [name] MRN: [id number]  Date: 01/22/2014  DOB: 19-Jul-1934  Weekly Radiation Therapy Management  Current Dose: 40 Gy     Planned Dose:  66 Gy  Narrative . . . . . . . . The patient presents for routine under treatment assessment.                                   The patient is without complaint.                                 Set-up films were reviewed.                                 The chart was checked. Physical Findings. . .  weight is 98 lb 6.4 oz (44.634 kg). Her oral temperature is 97.5 F (36.4 C). Her blood pressure is 132/47 and her pulse is 92. Her respiration is 12 and oxygen saturation is 96%. . Weight essentially stable.  No significant changes. Impression . . . . . . . The patient is tolerating radiation. Plan . . . . . . . . . . . . Continue treatment as planned.  ________________________________  Sheral Apley. Tammi Klippel, M.D.

## 2014-01-25 ENCOUNTER — Ambulatory Visit
Admission: RE | Admit: 2014-01-25 | Discharge: 2014-01-25 | Disposition: A | Discharge status: Home / Self Care | Payer: Medicare Other | Source: Ambulatory Visit | Attending: Radiation Oncology | Admitting: Radiation Oncology

## 2014-01-25 DIAGNOSIS — Z510 Encounter for antineoplastic radiation therapy: Secondary | ICD-10-CM | POA: Diagnosis not present

## 2014-01-26 ENCOUNTER — Ambulatory Visit
Admission: RE | Admit: 2014-01-26 | Discharge: 2014-01-26 | Disposition: A | Discharge status: Home / Self Care | Payer: Medicare Other | Source: Ambulatory Visit | Attending: Radiation Oncology | Admitting: Radiation Oncology

## 2014-01-26 DIAGNOSIS — Z510 Encounter for antineoplastic radiation therapy: Secondary | ICD-10-CM | POA: Diagnosis not present

## 2014-01-27 ENCOUNTER — Ambulatory Visit
Admission: RE | Admit: 2014-01-27 | Discharge: 2014-01-27 | Disposition: A | Discharge status: Home / Self Care | Payer: Medicare Other | Source: Ambulatory Visit | Attending: Radiation Oncology | Admitting: Radiation Oncology

## 2014-01-27 DIAGNOSIS — Z510 Encounter for antineoplastic radiation therapy: Secondary | ICD-10-CM | POA: Diagnosis not present

## 2014-01-28 ENCOUNTER — Ambulatory Visit
Admission: RE | Admit: 2014-01-28 | Discharge: 2014-01-28 | Disposition: A | Discharge status: Home / Self Care | Payer: Medicare Other | Source: Ambulatory Visit | Attending: Radiation Oncology | Admitting: Radiation Oncology

## 2014-01-28 DIAGNOSIS — Z510 Encounter for antineoplastic radiation therapy: Secondary | ICD-10-CM | POA: Diagnosis not present

## 2014-01-29 ENCOUNTER — Ambulatory Visit
Admission: RE | Admit: 2014-01-29 | Discharge: 2014-01-29 | Disposition: A | Discharge status: Home / Self Care | Payer: Medicare Other | Source: Ambulatory Visit | Attending: Radiation Oncology | Admitting: Radiation Oncology

## 2014-01-29 ENCOUNTER — Encounter: Payer: Self-pay | Admitting: Radiation Oncology

## 2014-01-29 VITALS — BP 133/52 | HR 81 | Resp 18 | Wt 97.8 lb

## 2014-01-29 DIAGNOSIS — C3492 Malignant neoplasm of unspecified part of left bronchus or lung: Secondary | ICD-10-CM

## 2014-01-29 DIAGNOSIS — Z510 Encounter for antineoplastic radiation therapy: Secondary | ICD-10-CM | POA: Diagnosis not present

## 2014-01-29 DIAGNOSIS — C3432 Malignant neoplasm of lower lobe, left bronchus or lung: Secondary | ICD-10-CM

## 2014-01-29 NOTE — Progress Notes (Signed)
  Radiation Oncology         [phone] ________________________________  Name: [name]  MRN: [id number]  Date: 01/29/2014  DOB: 02/20/1935  Weekly Radiation Therapy Management  Current Dose: 50 Gy     Planned Dose:  66 Gy  Narrative . . . . . . . . The patient presents for routine under treatment assessment.                                   The patient is without complaint.                                 Set-up films were reviewed.                                 The chart was checked. Physical Findings. . .  weight is 97 lb 12.8 oz (44.362 kg). Her blood pressure is 133/52 and her pulse is 81. Her respiration is 18 and oxygen saturation is 100%. . Weight essentially stable.  No significant changes. Impression . . . . . . . The patient is tolerating radiation. Plan . . . . . . . . . . . . Continue treatment as planned.  ________________________________  Sheral Apley. Tammi Klippel, M.D.

## 2014-01-29 NOTE — Progress Notes (Signed)
Reports a productive cough with clear sputum. Reports frequency of cough seems less. Denies skin changes within treatment field. Denies using radiaplex to treated skin despite encouragement from this nurse. Weight and vitals stable. Denies difficulty swallowing or a sore throat. Denies pain. Reports she does feel fatigued today.

## 2014-02-01 ENCOUNTER — Ambulatory Visit
Admission: RE | Admit: 2014-02-01 | Discharge: 2014-02-01 | Disposition: A | Discharge status: Home / Self Care | Payer: Medicare Other | Source: Ambulatory Visit | Attending: Radiation Oncology | Admitting: Radiation Oncology

## 2014-02-01 DIAGNOSIS — Z510 Encounter for antineoplastic radiation therapy: Secondary | ICD-10-CM | POA: Diagnosis not present

## 2014-02-02 ENCOUNTER — Ambulatory Visit
Admission: RE | Admit: 2014-02-02 | Discharge: 2014-02-02 | Disposition: A | Discharge status: Home / Self Care | Payer: Medicare Other | Source: Ambulatory Visit | Attending: Radiation Oncology | Admitting: Radiation Oncology

## 2014-02-02 DIAGNOSIS — Z510 Encounter for antineoplastic radiation therapy: Secondary | ICD-10-CM | POA: Diagnosis not present

## 2014-02-03 ENCOUNTER — Ambulatory Visit
Admission: RE | Admit: 2014-02-03 | Discharge: 2014-02-03 | Disposition: A | Discharge status: Home / Self Care | Payer: Medicare Other | Source: Ambulatory Visit | Attending: Radiation Oncology | Admitting: Radiation Oncology

## 2014-02-03 DIAGNOSIS — Z510 Encounter for antineoplastic radiation therapy: Secondary | ICD-10-CM | POA: Diagnosis not present

## 2014-02-04 ENCOUNTER — Ambulatory Visit
Admission: RE | Admit: 2014-02-04 | Discharge: 2014-02-04 | Disposition: A | Discharge status: Home / Self Care | Payer: Medicare Other | Source: Ambulatory Visit | Attending: Radiation Oncology | Admitting: Radiation Oncology

## 2014-02-04 DIAGNOSIS — Z510 Encounter for antineoplastic radiation therapy: Secondary | ICD-10-CM | POA: Diagnosis not present

## 2014-02-05 ENCOUNTER — Ambulatory Visit (HOSPITAL_COMMUNITY)
Admission: RE | Admit: 2014-02-05 | Discharge: 2014-02-05 | Disposition: A | Discharge status: Home / Self Care | Payer: Medicare Other | Source: Ambulatory Visit | Attending: Diagnostic Radiology | Admitting: Diagnostic Radiology

## 2014-02-05 ENCOUNTER — Telehealth: Payer: Self-pay | Admitting: *Deleted

## 2014-02-05 ENCOUNTER — Other Ambulatory Visit: Payer: Self-pay | Admitting: Internal Medicine

## 2014-02-05 ENCOUNTER — Other Ambulatory Visit (HOSPITAL_BASED_OUTPATIENT_CLINIC_OR_DEPARTMENT_OTHER): Payer: Medicare Other

## 2014-02-05 ENCOUNTER — Ambulatory Visit
Admission: RE | Admit: 2014-02-05 | Discharge: 2014-02-05 | Disposition: A | Discharge status: Home / Self Care | Payer: Medicare Other | Source: Ambulatory Visit | Attending: Radiation Oncology | Admitting: Radiation Oncology

## 2014-02-05 ENCOUNTER — Encounter: Payer: Self-pay | Admitting: Radiation Oncology

## 2014-02-05 VITALS — BP 158/78 | HR 116 | Temp 98.8°F | Resp 20 | Wt 97.6 lb

## 2014-02-05 DIAGNOSIS — C3492 Malignant neoplasm of unspecified part of left bronchus or lung: Secondary | ICD-10-CM

## 2014-02-05 DIAGNOSIS — J13 Pneumonia due to Streptococcus pneumoniae: Secondary | ICD-10-CM | POA: Diagnosis not present

## 2014-02-05 DIAGNOSIS — J9819 Other pulmonary collapse: Secondary | ICD-10-CM | POA: Insufficient documentation

## 2014-02-05 DIAGNOSIS — J90 Pleural effusion, not elsewhere classified: Secondary | ICD-10-CM | POA: Diagnosis not present

## 2014-02-05 DIAGNOSIS — C3430 Malignant neoplasm of lower lobe, unspecified bronchus or lung: Secondary | ICD-10-CM

## 2014-02-05 DIAGNOSIS — R599 Enlarged lymph nodes, unspecified: Secondary | ICD-10-CM | POA: Diagnosis not present

## 2014-02-05 DIAGNOSIS — C3490 Malignant neoplasm of unspecified part of unspecified bronchus or lung: Secondary | ICD-10-CM | POA: Insufficient documentation

## 2014-02-05 DIAGNOSIS — J8410 Pulmonary fibrosis, unspecified: Secondary | ICD-10-CM | POA: Insufficient documentation

## 2014-02-05 DIAGNOSIS — Z510 Encounter for antineoplastic radiation therapy: Secondary | ICD-10-CM | POA: Diagnosis not present

## 2014-02-05 LAB — COMPREHENSIVE METABOLIC PANEL (CC13)
ALBUMIN: 3 g/dL — AB (ref 3.5–5.0)
ALK PHOS: 71 U/L (ref 40–150)
ALT: 8 U/L (ref 0–55)
AST: 13 U/L (ref 5–34)
Anion Gap: 7 mEq/L (ref 3–11)
BUN: 10.2 mg/dL (ref 7.0–26.0)
CO2: 29 mEq/L (ref 22–29)
Calcium: 9.1 mg/dL (ref 8.4–10.4)
Chloride: 97 mEq/L — ABNORMAL LOW (ref 98–109)
Creatinine: 0.7 mg/dL (ref 0.6–1.1)
Glucose: 104 mg/dl (ref 70–140)
Potassium: 4.2 mEq/L (ref 3.5–5.1)
SODIUM: 133 meq/L — AB (ref 136–145)
TOTAL PROTEIN: 7 g/dL (ref 6.4–8.3)
Total Bilirubin: 0.23 mg/dL (ref 0.20–1.20)

## 2014-02-05 LAB — CBC WITH DIFFERENTIAL/PLATELET
BASO%: 0.4 % (ref 0.0–2.0)
BASOS ABS: 0 10*3/uL (ref 0.0–0.1)
EOS ABS: 0.1 10*3/uL (ref 0.0–0.5)
EOS%: 2.5 % (ref 0.0–7.0)
HCT: 35.4 % (ref 34.8–46.6)
HGB: 11.5 g/dL — ABNORMAL LOW (ref 11.6–15.9)
LYMPH%: 5.2 % — ABNORMAL LOW (ref 14.0–49.7)
MCH: 31.2 pg (ref 25.1–34.0)
MCHC: 32.5 g/dL (ref 31.5–36.0)
MCV: 96 fL (ref 79.5–101.0)
MONO#: 1 10*3/uL — ABNORMAL HIGH (ref 0.1–0.9)
MONO%: 17.6 % — AB (ref 0.0–14.0)
NEUT%: 74.3 % (ref 38.4–76.8)
NEUTROS ABS: 4.2 10*3/uL (ref 1.5–6.5)
PLATELETS: 356 10*3/uL (ref 145–400)
RBC: 3.69 10*6/uL — ABNORMAL LOW (ref 3.70–5.45)
RDW: 16.6 % — ABNORMAL HIGH (ref 11.2–14.5)
WBC: 5.7 10*3/uL (ref 3.9–10.3)
lymph#: 0.3 10*3/uL — ABNORMAL LOW (ref 0.9–3.3)

## 2014-02-05 MED ORDER — HYDROCODONE-ACETAMINOPHEN 5-325 MG PO TABS: 1.0000 | ORAL_TABLET | Freq: Four times a day (QID) | ORAL | Status: DC | PRN

## 2014-02-05 NOTE — Telephone Encounter (Signed)
Per Dr Vista Mink, CT scan shows questionable PNA.  Pt needs to be evaluated for fever or increased SOB.  If these symptoms are present, pt may be placed on levaquin 500mg  daily x 7 days.  F/u with [facility] on 02/11/14.  Attempted to call pt on home and cell phone.  Left message on both requesting a call back.  SLJ

## 2014-02-05 NOTE — Progress Notes (Signed)
  Radiation Oncology         [phone] ________________________________  Name: [name]  MRN: [id number]  Date: 02/05/2014  DOB: [birth year]  Weekly Radiation Therapy Management  Current Dose: 60 Gy     Planned Dose:  66 Gy  Narrative . . . . . . . . The patient presents for routine under treatment assessment.  Patient states she has pain in her lower back and upper left chest, particularly when she coughs. She states the pain "comes and goes". Her cough is productive with clear sputum. She states Tessalon caps not helpful with cough. Pt has SOB w/minimal activity, uses O2 prn in the daytime and nightly. Advised her that her O 2 sat today is low, 89-90 % indicating she needs to be on O2 more frequently, possibly all the time. Pt states when she gets SOB at home she either puts on O 2 or "just sits down". She was prescribed O2 by her PCP, Dr Maudie Mercury. Pt states her appetite is not particularly good but this is her normal. She drinks Ensure 3 cans daily. She is fatigued                                   The patient is without complaint.                                 Set-up films were reviewed.                                 The chart was checked. Physical Findings. . .  weight is 97 lb 9.6 oz (44.271 kg). Her oral temperature is 98.8 F (37.1 C). Her blood pressure is 158/78 and her pulse is 116. Her respiration is 20 and oxygen saturation is 90%. . Weight essentially stable.  No significant changes. Impression . . . . . . . The patient is tolerating radiation. Plan . . . . . . . . . . . . Continue treatment as planned.  Given hydrocodone for cough.  ________________________________  Sheral Apley. Tammi Klippel, M.D.

## 2014-02-05 NOTE — Progress Notes (Signed)
Patient states she has pain in her lower back and upper left chest, particularly when she coughs. She states the pain "comes and goes". Her cough is productive with clear sputum. She states Tessalon caps not helpful with cough. Pt has SOB w/minimal activity, uses O2 prn in the daytime and nightly. Advised her that her O 2 sat today is low, 89-90 % indicating she needs to be on O2 more frequently, possibly all the time. Pt states when she gets SOB at home she either puts on O 2 or "just sits down". She was prescribed O2 by her PCP, Dr Maudie Mercury. Pt states her appetite is not particularly good but this is her normal. She drinks Ensure 3 cans daily. She is fatigued.

## 2014-02-09 ENCOUNTER — Ambulatory Visit
Admission: RE | Admit: 2014-02-09 | Discharge: 2014-02-09 | Disposition: A | Discharge status: Home / Self Care | Payer: Medicare Other | Source: Ambulatory Visit | Attending: Radiation Oncology | Admitting: Radiation Oncology

## 2014-02-09 DIAGNOSIS — Z510 Encounter for antineoplastic radiation therapy: Secondary | ICD-10-CM | POA: Diagnosis not present

## 2014-02-09 DIAGNOSIS — R634 Abnormal weight loss: Secondary | ICD-10-CM | POA: Insufficient documentation

## 2014-02-09 DIAGNOSIS — J189 Pneumonia, unspecified organism: Secondary | ICD-10-CM | POA: Diagnosis not present

## 2014-02-09 DIAGNOSIS — R0789 Other chest pain: Secondary | ICD-10-CM | POA: Insufficient documentation

## 2014-02-09 DIAGNOSIS — C3430 Malignant neoplasm of lower lobe, unspecified bronchus or lung: Secondary | ICD-10-CM | POA: Insufficient documentation

## 2014-02-09 DIAGNOSIS — Z9981 Dependence on supplemental oxygen: Secondary | ICD-10-CM | POA: Diagnosis not present

## 2014-02-10 ENCOUNTER — Ambulatory Visit
Admission: RE | Admit: 2014-02-10 | Discharge: 2014-02-10 | Disposition: A | Discharge status: Home / Self Care | Payer: Medicare Other | Source: Ambulatory Visit | Attending: Radiation Oncology | Admitting: Radiation Oncology

## 2014-02-10 DIAGNOSIS — Z510 Encounter for antineoplastic radiation therapy: Secondary | ICD-10-CM | POA: Diagnosis not present

## 2014-02-11 ENCOUNTER — Ambulatory Visit (HOSPITAL_BASED_OUTPATIENT_CLINIC_OR_DEPARTMENT_OTHER): Payer: Medicare Other | Admitting: Internal Medicine

## 2014-02-11 ENCOUNTER — Ambulatory Visit
Admission: RE | Admit: 2014-02-11 | Discharge: 2014-02-11 | Disposition: A | Discharge status: Home / Self Care | Payer: Medicare Other | Source: Ambulatory Visit | Attending: Radiation Oncology | Admitting: Radiation Oncology

## 2014-02-11 ENCOUNTER — Encounter: Payer: Self-pay | Admitting: Radiation Oncology

## 2014-02-11 ENCOUNTER — Telehealth: Payer: Self-pay | Admitting: Internal Medicine

## 2014-02-11 ENCOUNTER — Encounter: Payer: Self-pay | Admitting: Internal Medicine

## 2014-02-11 VITALS — BP 108/47 | HR 79 | Resp 20 | Wt 96.3 lb

## 2014-02-11 VITALS — BP 127/54 | HR 102 | Temp 97.5°F | Resp 16 | Ht 63.0 in | Wt 96.3 lb

## 2014-02-11 DIAGNOSIS — R634 Abnormal weight loss: Secondary | ICD-10-CM

## 2014-02-11 DIAGNOSIS — R079 Chest pain, unspecified: Secondary | ICD-10-CM

## 2014-02-11 DIAGNOSIS — C3492 Malignant neoplasm of unspecified part of left bronchus or lung: Secondary | ICD-10-CM

## 2014-02-11 DIAGNOSIS — Z510 Encounter for antineoplastic radiation therapy: Secondary | ICD-10-CM | POA: Diagnosis not present

## 2014-02-11 DIAGNOSIS — J189 Pneumonia, unspecified organism: Secondary | ICD-10-CM

## 2014-02-11 DIAGNOSIS — C3430 Malignant neoplasm of lower lobe, unspecified bronchus or lung: Secondary | ICD-10-CM

## 2014-02-11 MED ORDER — DOXYCYCLINE HYCLATE 100 MG PO CAPS: 100.0000 mg | ORAL_CAPSULE | Freq: Two times a day (BID) | ORAL | Status: DC

## 2014-02-11 NOTE — Patient Instructions (Signed)
Smoking Cessation, Tips for Success  If you are ready to quit smoking, congratulations! You have chosen to help yourself be healthier. Cigarettes bring nicotine, tar, carbon monoxide, and other irritants into your body. Your lungs, heart, and blood vessels will be able to work better without these poisons. There are many different ways to quit smoking. Nicotine gum, nicotine patches, a nicotine inhaler, or nicotine nasal spray can help with physical craving. Hypnosis, support groups, and medicines help break the habit of smoking.  WHAT THINGS CAN I DO TO MAKE QUITTING EASIER?   Here are some tips to help you quit for good:  · Pick a date when you will quit smoking completely. Tell all of your friends and family about your plan to quit on that date.  · Do not try to slowly cut down on the number of cigarettes you are smoking. Pick a quit date and quit smoking completely starting on that day.  · Throw away all cigarettes.    · Clean and remove all ashtrays from your home, work, and car.  · On a card, write down your reasons for quitting. Carry the card with you and read it when you get the urge to smoke.  · Cleanse your body of nicotine. Drink enough water and fluids to keep your urine clear or pale yellow. Do this after quitting to flush the nicotine from your body.  · Learn to predict your moods. Do not let a bad situation be your excuse to have a cigarette. Some situations in your life might tempt you into wanting a cigarette.  · Never have "just one" cigarette. It leads to wanting another and another. Remind yourself of your decision to quit.  · Change habits associated with smoking. If you smoked while driving or when feeling stressed, try other activities to replace smoking. Stand up when drinking your coffee. Brush your teeth after eating. Sit in a different chair when you read the paper. Avoid alcohol while trying to quit, and try to drink fewer caffeinated beverages. Alcohol and caffeine may urge you to  smoke.  · Avoid foods and drinks that can trigger a desire to smoke, such as sugary or spicy foods and alcohol.  · Ask people who smoke not to smoke around you.  · Have something planned to do right after eating or having a cup of coffee. For example, plan to take a walk or exercise.  · Try a relaxation exercise to calm you down and decrease your stress. Remember, you may be tense and nervous for the first 2 weeks after you quit, but this will pass.  · Find new activities to keep your hands busy. Play with a pen, coin, or rubber band. Doodle or draw things on paper.  · Brush your teeth right after eating. This will help cut down on the craving for the taste of tobacco after meals. You can also try mouthwash.    · Use oral substitutes in place of cigarettes. Try using lemon drops, carrots, cinnamon sticks, or chewing gum. Keep them handy so they are available when you have the urge to smoke.  · When you have the urge to smoke, try deep breathing.  · Designate your home as a nonsmoking area.  · If you are a heavy smoker, ask your health care provider about a prescription for nicotine chewing gum. It can ease your withdrawal from nicotine.  · Reward yourself. Set aside the cigarette money you save and buy yourself something nice.  · Look for   support from others. Join a support group or smoking cessation program. Ask someone at home or at work to help you with your plan to quit smoking.  · Always ask yourself, "Do I need this cigarette or is this just a reflex?" Tell yourself, "Today, I choose not to smoke," or "I do not want to smoke." You are reminding yourself of your decision to quit.  · Do not replace cigarette smoking with electronic cigarettes (commonly called e-cigarettes). The safety of e-cigarettes is unknown, and some may contain harmful chemicals.  · If you relapse, do not give up! Plan ahead and think about what you will do the next time you get the urge to smoke.  HOW WILL I FEEL WHEN I QUIT SMOKING?  You  may have symptoms of withdrawal because your body is used to nicotine (the addictive substance in cigarettes). You may crave cigarettes, be irritable, feel very hungry, cough often, get headaches, or have difficulty concentrating. The withdrawal symptoms are only temporary. They are strongest when you first quit but will go away within 10-14 days. When withdrawal symptoms occur, stay in control. Think about your reasons for quitting. Remind yourself that these are signs that your body is healing and getting used to being without cigarettes. Remember that withdrawal symptoms are easier to treat than the major diseases that smoking can cause.   Even after the withdrawal is over, expect periodic urges to smoke. However, these cravings are generally short lived and will go away whether you smoke or not. Do not smoke!  WHAT RESOURCES ARE AVAILABLE TO HELP ME QUIT SMOKING?  Your health care provider can direct you to community resources or hospitals for support, which may include:  · Group support.  · Education.  · Hypnosis.  · Therapy.  Document Released: 02/17/2004 Document Revised: 10/05/2013 Document Reviewed: 11/06/2012  [employer]® Patient Information ©2015 [employer]. This information is not intended to replace advice given to you by your health care provider. Make sure you discuss any questions you have with your health care provider.
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
## 2014-02-11 NOTE — Progress Notes (Signed)
  Radiation Oncology         ([phone]) [phone] ________________________________  Name: [name]  MRN: [id number]  Date: 02/11/2014  DOB: [birth year]  Weekly Radiation Therapy Management  Current Dose: 66 Gy     Planned Dose:  66 Gy  Narrative . . . . . . . . The patient presents for routine under treatment assessment.                                   Patient completes treatment today. Patient scheduled for one month follow up on 03/18/2014. Weight stable. Productive cough with clear sputum more frequent. Dr. Julien Nordmann started the patient today on doxcycline because he is concerned pneumonia is developing. Reports difficulty swallowing continues. No skin changes noted to chest of upper back. Patient understands to continue using radiaplex bid as directed for the next two weeks. Reports her ribs ache from coughing so much                                 Set-up films were reviewed.                                 The chart was checked. Physical Findings. . .  weight is 96 lb 4.8 oz (43.681 kg). Her blood pressure is 108/47 and her pulse is 79. Her respiration is 20 and oxygen saturation is 91%. . Weight essentially stable.  No significant changes. Impression . . . . . . . The patient is tolerating radiation. Plan . . . . . . . . . . . . she completes treatment today. She is scheduled for CT scan on October 10. She will see me again on October 15 and see medical oncology on October 17.  ________________________________  Sheral Apley. Tammi Klippel, M.D.

## 2014-02-11 NOTE — Progress Notes (Signed)
Patient completes treatment today. Patient scheduled for one month follow up on 03/18/2014. Weight stable. Productive cough with clear sputum more frequent. Dr. Julien Nordmann started the patient today on doxcycline because he is concerned pneumonia is developing. Reports difficulty swallowing continues. No skin changes noted to chest of upper back. Patient understands to continue using radiaplex bid as directed for the next two weeks. Reports her ribs ache from coughing so much.

## 2014-02-11 NOTE — Telephone Encounter (Signed)
Pt confirmed labs/ov per 09/10 POF, gave pt AVS....KJ °

## 2014-02-13 NOTE — Progress Notes (Signed)
  Radiation Oncology         [phone] ________________________________  Name: [name] MRN: [id number]  Date: 02/11/2014  DOB: 09-18-1934  End of Treatment Note  Diagnosis:   79 yo woman with local recurrence of left lower lung cancer s/p previous SBRT  Indication for treatment:  Curative, Salvage of Local Recurrence       Radiation treatment dates:   12/28/2013-02/11/2014  Site/dose:   The recurrent left lower lung mass was treated to 66 Gy in 33 fractions of 2 Gy  Beams/energy:   The patient was treated with 3D conformal radiotherapy and image-guidance using 6 dynamic conformal arc beams from an array of segmented arcs circumferentially around the axial plane with 6 MV X-rays. The arc segments were weighted to optimize the radiation distribution.  Narrative: The patient tolerated radiation treatment relatively well.   She had cough and some back pain during radiation with minimal esophagitis.  Plan: The patient has completed radiation treatment. The patient will return to radiation oncology clinic for routine followup in one month. I advised them to call or return sooner if they have any questions or concerns related to their recovery or treatment. ________________________________  Sheral Apley. Tammi Klippel, M.D.

## 2014-02-22 ENCOUNTER — Telehealth: Payer: Self-pay | Admitting: Radiation Oncology

## 2014-02-22 NOTE — Telephone Encounter (Signed)
Returned message left on answering by patient's daughter, [name]. On the message left [name] reports her mother is in need of a prescription refill but, doesn't say which one. Received no answer when returned call. Left message requesting a call back.

## 2014-02-24 ENCOUNTER — Telehealth: Payer: Self-pay | Admitting: Radiation Oncology

## 2014-02-24 ENCOUNTER — Other Ambulatory Visit: Payer: Self-pay | Admitting: Radiation Oncology

## 2014-02-24 DIAGNOSIS — C3492 Malignant neoplasm of unspecified part of left bronchus or lung: Secondary | ICD-10-CM

## 2014-02-24 MED ORDER — HYDROCODONE-ACETAMINOPHEN 5-325 MG PO TABS: 1.0000 | ORAL_TABLET | Freq: Four times a day (QID) | ORAL | Status: DC | PRN

## 2014-02-24 NOTE — Progress Notes (Signed)
  Radiation Oncology         [phone] ________________________________  Name: [name]  MRN: [id number]  Date: 12/17/2013  DOB: Feb 10, 1935  SPECIAL TREATMENT PROCEDURE NOTE  NARRATIVE:  The planned course of therapy using radiation constitutes a special treatment procedure. Special care is required in the management of this patient for the following reasons.   Retreatment in a previously radiated area requiring careful monitoring of increased risk of toxicity due to overlap of previous treatment..   The special nature of the planned course of radiotherapy will require increased physician supervision and oversight to ensure patient's safety with optimal treatment outcomes. ________________________________  Sheral Apley Tammi Klippel, M.D.

## 2014-02-24 NOTE — Telephone Encounter (Signed)
Spoke with patient's daughter, [name]. Explained her mother's pain script will be ready for pick up after 3:30 today. Instructed her to bring ID. Reports she will pick up script tomorrow.

## 2014-03-07 ENCOUNTER — Encounter (HOSPITAL_COMMUNITY): Payer: Medicare Other | Admitting: Anesthesiology

## 2014-03-07 ENCOUNTER — Inpatient Hospital Stay (HOSPITAL_COMMUNITY): Payer: Medicare Other

## 2014-03-07 ENCOUNTER — Emergency Department (HOSPITAL_COMMUNITY): Payer: Medicare Other

## 2014-03-07 ENCOUNTER — Inpatient Hospital Stay (HOSPITAL_COMMUNITY): Payer: Medicare Other | Admitting: Anesthesiology

## 2014-03-07 ENCOUNTER — Encounter (HOSPITAL_COMMUNITY): Payer: Self-pay | Admitting: Emergency Medicine

## 2014-03-07 ENCOUNTER — Inpatient Hospital Stay (HOSPITAL_COMMUNITY)
Admission: EM | Admit: 2014-03-07 | Discharge: 2014-03-09 | DRG: 469 | Disposition: A | Discharge status: Skilled Nursing Facility | Payer: Medicare Other | Attending: Family Medicine | Admitting: Family Medicine

## 2014-03-07 ENCOUNTER — Encounter (HOSPITAL_COMMUNITY)
Admission: EM | Disposition: A | Discharge status: Skilled Nursing Facility | Payer: Self-pay | Source: Home / Self Care | Attending: Family Medicine

## 2014-03-07 DIAGNOSIS — S72001A Fracture of unspecified part of neck of right femur, initial encounter for closed fracture: Secondary | ICD-10-CM | POA: Diagnosis present

## 2014-03-07 DIAGNOSIS — Z79899 Other long term (current) drug therapy: Secondary | ICD-10-CM | POA: Diagnosis not present

## 2014-03-07 DIAGNOSIS — S72031A Displaced midcervical fracture of right femur, initial encounter for closed fracture: Principal | ICD-10-CM | POA: Diagnosis present

## 2014-03-07 DIAGNOSIS — Z8249 Family history of ischemic heart disease and other diseases of the circulatory system: Secondary | ICD-10-CM | POA: Diagnosis not present

## 2014-03-07 DIAGNOSIS — Z923 Personal history of irradiation: Secondary | ICD-10-CM

## 2014-03-07 DIAGNOSIS — J449 Chronic obstructive pulmonary disease, unspecified: Secondary | ICD-10-CM | POA: Diagnosis present

## 2014-03-07 DIAGNOSIS — Z9981 Dependence on supplemental oxygen: Secondary | ICD-10-CM | POA: Diagnosis not present

## 2014-03-07 DIAGNOSIS — S72009A Fracture of unspecified part of neck of unspecified femur, initial encounter for closed fracture: Secondary | ICD-10-CM | POA: Diagnosis present

## 2014-03-07 DIAGNOSIS — R0902 Hypoxemia: Secondary | ICD-10-CM | POA: Diagnosis not present

## 2014-03-07 DIAGNOSIS — E43 Unspecified severe protein-calorie malnutrition: Secondary | ICD-10-CM | POA: Diagnosis present

## 2014-03-07 DIAGNOSIS — D62 Acute posthemorrhagic anemia: Secondary | ICD-10-CM | POA: Diagnosis not present

## 2014-03-07 DIAGNOSIS — W03XXXA Other fall on same level due to collision with another person, initial encounter: Secondary | ICD-10-CM | POA: Diagnosis present

## 2014-03-07 DIAGNOSIS — C3490 Malignant neoplasm of unspecified part of unspecified bronchus or lung: Secondary | ICD-10-CM | POA: Diagnosis present

## 2014-03-07 DIAGNOSIS — Z85118 Personal history of other malignant neoplasm of bronchus and lung: Secondary | ICD-10-CM | POA: Diagnosis not present

## 2014-03-07 DIAGNOSIS — Y92009 Unspecified place in unspecified non-institutional (private) residence as the place of occurrence of the external cause: Secondary | ICD-10-CM

## 2014-03-07 DIAGNOSIS — F17210 Nicotine dependence, cigarettes, uncomplicated: Secondary | ICD-10-CM | POA: Diagnosis present

## 2014-03-07 DIAGNOSIS — I10 Essential (primary) hypertension: Secondary | ICD-10-CM | POA: Diagnosis present

## 2014-03-07 DIAGNOSIS — C3430 Malignant neoplasm of lower lobe, unspecified bronchus or lung: Secondary | ICD-10-CM | POA: Diagnosis present

## 2014-03-07 DIAGNOSIS — Z681 Body mass index (BMI) 19 or less, adult: Secondary | ICD-10-CM

## 2014-03-07 HISTORY — PX: HIP ARTHROPLASTY: SHX981

## 2014-03-07 LAB — BASIC METABOLIC PANEL
ANION GAP: 11 (ref 5–15)
BUN: 16 mg/dL (ref 6–23)
CO2: 31 mEq/L (ref 19–32)
Calcium: 8.8 mg/dL (ref 8.4–10.5)
Chloride: 94 mEq/L — ABNORMAL LOW (ref 96–112)
Creatinine, Ser: 0.47 mg/dL — ABNORMAL LOW (ref 0.50–1.10)
GFR calc non Af Amer: >90 mL/min (ref >90)
Glucose, Bld: 112 mg/dL — ABNORMAL HIGH (ref 70–99)
Potassium: 4 mEq/L (ref 3.7–5.3)
Sodium: 136 mEq/L — ABNORMAL LOW (ref 137–147)

## 2014-03-07 LAB — CBC WITH DIFFERENTIAL/PLATELET
Basophils Absolute: 0 10*3/uL (ref 0.0–0.1)
Basophils Relative: 0 % (ref 0–1)
Eosinophils Absolute: 0 10*3/uL (ref 0.0–0.7)
Eosinophils Relative: 0 % (ref 0–5)
HCT: 31.7 % — ABNORMAL LOW (ref 36.0–46.0)
HEMOGLOBIN: 10.4 g/dL — AB (ref 12.0–15.0)
Lymphocytes Relative: 5 % — ABNORMAL LOW (ref 12–46)
Lymphs Abs: 0.4 10*3/uL — ABNORMAL LOW (ref 0.7–4.0)
MCH: 30.3 pg (ref 26.0–34.0)
MCHC: 32.8 g/dL (ref 30.0–36.0)
MCV: 92.4 fL (ref 78.0–100.0)
MONOS PCT: 8 % (ref 3–12)
Monocytes Absolute: 0.6 10*3/uL (ref 0.1–1.0)
NEUTROS ABS: 6.6 10*3/uL (ref 1.7–7.7)
Neutrophils Relative %: 87 % — ABNORMAL HIGH (ref 43–77)
Platelets: 330 10*3/uL (ref 150–400)
RBC: 3.43 MIL/uL — ABNORMAL LOW (ref 3.87–5.11)
RDW: 17.1 % — ABNORMAL HIGH (ref 11.5–15.5)
WBC: 7.6 10*3/uL (ref 4.0–10.5)

## 2014-03-07 LAB — URINALYSIS, ROUTINE W REFLEX MICROSCOPIC
Bilirubin Urine: NEGATIVE
Glucose, UA: NEGATIVE mg/dL
KETONES UR: 15 mg/dL — AB
Nitrite: POSITIVE — AB
PROTEIN: 30 mg/dL — AB
Specific Gravity, Urine: 1.019 (ref 1.005–1.030)
Urobilinogen, UA: 0.2 mg/dL (ref 0.0–1.0)
pH: 6 (ref 5.0–8.0)

## 2014-03-07 LAB — ABO/RH: ABO/RH(D): O POS

## 2014-03-07 LAB — TYPE AND SCREEN
ABO/RH(D): O POS
Antibody Screen: NEGATIVE

## 2014-03-07 LAB — PROTIME-INR
INR: 1.08 (ref 0.00–1.49)
Prothrombin Time: 14 seconds (ref 11.6–15.2)

## 2014-03-07 LAB — URINE MICROSCOPIC-ADD ON

## 2014-03-07 SURGERY — Surgical Case
Anesthesia: *Unknown

## 2014-03-07 SURGERY — HEMIARTHROPLASTY, HIP, DIRECT ANTERIOR APPROACH, FOR FRACTURE
Anesthesia: Spinal | Site: Hip | Laterality: Right

## 2014-03-07 MED ORDER — MIDAZOLAM HCL 2 MG/2ML IJ SOLN
INTRAMUSCULAR | Status: AC
  Filled 2014-03-07: qty 2

## 2014-03-07 MED ORDER — MIDAZOLAM HCL 5 MG/5ML IJ SOLN
INTRAMUSCULAR | Status: DC | PRN
  Administered 2014-03-07 (×2): 1 mg via INTRAVENOUS

## 2014-03-07 MED ORDER — CEFAZOLIN SODIUM-DEXTROSE 2-3 GM-% IV SOLR
2.0000 g | Freq: Four times a day (QID) | INTRAVENOUS | Status: AC
  Administered 2014-03-07 – 2014-03-08 (×3): 2 g via INTRAVENOUS
  Filled 2014-03-07 (×3): qty 50

## 2014-03-07 MED ORDER — HYDROCODONE-ACETAMINOPHEN 5-325 MG PO TABS: 1.0000 | ORAL_TABLET | Freq: Four times a day (QID) | ORAL | Status: DC | PRN

## 2014-03-07 MED ORDER — LACTATED RINGERS IV SOLN
INTRAVENOUS | Status: DC | PRN
  Administered 2014-03-07 (×2): via INTRAVENOUS

## 2014-03-07 MED ORDER — CALCIUM CARBONATE-VITAMIN D 500-200 MG-UNIT PO TABS: 1.0000 | ORAL_TABLET | Freq: Every day | ORAL | Status: AC

## 2014-03-07 MED ORDER — ALUM & MAG HYDROXIDE-SIMETH 200-200-20 MG/5ML PO SUSP: 30.0000 mL | ORAL | Status: DC | PRN

## 2014-03-07 MED ORDER — OXYCODONE HCL 5 MG PO TABS
5.0000 mg | ORAL_TABLET | ORAL | Status: DC | PRN
  Administered 2014-03-07 – 2014-03-08 (×2): 5 mg via ORAL
  Filled 2014-03-07 (×2): qty 1

## 2014-03-07 MED ORDER — SODIUM CHLORIDE 0.9 % IV SOLN
INTRAVENOUS | Status: DC
  Administered 2014-03-07 – 2014-03-08 (×2): via INTRAVENOUS

## 2014-03-07 MED ORDER — SENNA 8.6 MG PO TABS
1.0000 | ORAL_TABLET | Freq: Two times a day (BID) | ORAL | Status: DC
  Administered 2014-03-07 – 2014-03-09 (×3): 8.6 mg via ORAL
  Filled 2014-03-07 (×5): qty 1

## 2014-03-07 MED ORDER — MORPHINE SULFATE 2 MG/ML IJ SOLN: 0.5000 mg | INTRAMUSCULAR | Status: DC | PRN

## 2014-03-07 MED ORDER — ONDANSETRON HCL 4 MG/2ML IJ SOLN: 4.0000 mg | Freq: Once | INTRAMUSCULAR | Status: DC | PRN

## 2014-03-07 MED ORDER — FENTANYL CITRATE 0.05 MG/ML IJ SOLN
INTRAMUSCULAR | Status: AC
  Filled 2014-03-07: qty 5

## 2014-03-07 MED ORDER — PROPOFOL 10 MG/ML IV BOLUS
INTRAVENOUS | Status: DC | PRN
  Administered 2014-03-07: 20 mg via INTRAVENOUS
  Administered 2014-03-07: 10 mg via INTRAVENOUS
  Administered 2014-03-07: 70 mg via INTRAVENOUS

## 2014-03-07 MED ORDER — OXYCODONE HCL 5 MG PO TABS: 5.0000 mg | ORAL_TABLET | ORAL | Status: DC | PRN

## 2014-03-07 MED ORDER — ONDANSETRON HCL 4 MG PO TABS: 4.0000 mg | ORAL_TABLET | Freq: Four times a day (QID) | ORAL | Status: DC | PRN

## 2014-03-07 MED ORDER — WHITE PETROLATUM GEL
Status: AC
  Filled 2014-03-07: qty 5

## 2014-03-07 MED ORDER — ONDANSETRON HCL 4 MG/2ML IJ SOLN
INTRAMUSCULAR | Status: AC
  Filled 2014-03-07: qty 2

## 2014-03-07 MED ORDER — PROMETHAZINE HCL 25 MG PO TABS: 25.0000 mg | ORAL_TABLET | Freq: Four times a day (QID) | ORAL | Status: DC | PRN

## 2014-03-07 MED ORDER — SODIUM CHLORIDE 0.9 % IR SOLN
Status: DC | PRN
  Administered 2014-03-07: 1000 mL

## 2014-03-07 MED ORDER — ACETAMINOPHEN 325 MG PO TABS: 650.0000 mg | ORAL_TABLET | Freq: Four times a day (QID) | ORAL | Status: DC | PRN

## 2014-03-07 MED ORDER — SODIUM CHLORIDE 0.9 % IV SOLN
10.0000 mg | INTRAVENOUS | Status: DC | PRN
  Administered 2014-03-07: 10 ug/min via INTRAVENOUS

## 2014-03-07 MED ORDER — RIVAROXABAN 10 MG PO TABS: 10.0000 mg | ORAL_TABLET | Freq: Every day | ORAL | Status: DC

## 2014-03-07 MED ORDER — LOSARTAN POTASSIUM 25 MG PO TABS
25.0000 mg | ORAL_TABLET | Freq: Every day | ORAL | Status: DC
  Administered 2014-03-08 – 2014-03-09 (×2): 25 mg via ORAL
  Filled 2014-03-07 (×3): qty 1

## 2014-03-07 MED ORDER — HYDROCODONE-ACETAMINOPHEN 5-325 MG PO TABS
1.0000 | ORAL_TABLET | Freq: Four times a day (QID) | ORAL | Status: DC | PRN
  Administered 2014-03-07 – 2014-03-08 (×2): 2 via ORAL
  Administered 2014-03-08: 1 via ORAL
  Administered 2014-03-09: 2 via ORAL
  Filled 2014-03-07: qty 1
  Filled 2014-03-07 (×2): qty 2

## 2014-03-07 MED ORDER — SORBITOL 70 % SOLN: 30.0000 mL | Freq: Every day | Status: DC | PRN

## 2014-03-07 MED ORDER — PHENOL 1.4 % MT LIQD: 1.0000 | OROMUCOSAL | Status: DC | PRN

## 2014-03-07 MED ORDER — CEFAZOLIN SODIUM-DEXTROSE 2-3 GM-% IV SOLR
INTRAVENOUS | Status: AC
  Filled 2014-03-07: qty 50

## 2014-03-07 MED ORDER — METOCLOPRAMIDE HCL 10 MG PO TABS: 5.0000 mg | ORAL_TABLET | Freq: Three times a day (TID) | ORAL | Status: DC | PRN

## 2014-03-07 MED ORDER — DILTIAZEM HCL ER 120 MG PO CP24
120.0000 mg | ORAL_CAPSULE | Freq: Every morning | ORAL | Status: DC
  Administered 2014-03-08 – 2014-03-09 (×2): 120 mg via ORAL
  Filled 2014-03-07 (×3): qty 1

## 2014-03-07 MED ORDER — FENTANYL CITRATE 0.05 MG/ML IJ SOLN: 25.0000 ug | INTRAMUSCULAR | Status: DC | PRN

## 2014-03-07 MED ORDER — HYDROMORPHONE HCL 1 MG/ML IJ SOLN
0.5000 mg | INTRAMUSCULAR | Status: DC | PRN
  Filled 2014-03-07: qty 1

## 2014-03-07 MED ORDER — DEXTROSE 5 % IV SOLN
INTRAVENOUS | Status: DC | PRN
  Administered 2014-03-07: 16:00:00 via INTRAVENOUS

## 2014-03-07 MED ORDER — METHOCARBAMOL 1000 MG/10ML IJ SOLN
500.0000 mg | Freq: Four times a day (QID) | INTRAVENOUS | Status: DC | PRN
  Filled 2014-03-07: qty 5

## 2014-03-07 MED ORDER — ARTIFICIAL TEARS OP OINT
TOPICAL_OINTMENT | OPHTHALMIC | Status: AC
  Filled 2014-03-07: qty 3.5

## 2014-03-07 MED ORDER — METHOCARBAMOL 500 MG PO TABS
500.0000 mg | ORAL_TABLET | Freq: Four times a day (QID) | ORAL | Status: DC | PRN
  Administered 2014-03-07 – 2014-03-08 (×2): 500 mg via ORAL
  Filled 2014-03-07 (×2): qty 1

## 2014-03-07 MED ORDER — MAGNESIUM CITRATE PO SOLN: 1.0000 | Freq: Once | ORAL | Status: AC | PRN

## 2014-03-07 MED ORDER — LACTATED RINGERS IV SOLN: INTRAVENOUS | Status: DC

## 2014-03-07 MED ORDER — ACETAMINOPHEN 650 MG RE SUPP: 650.0000 mg | Freq: Four times a day (QID) | RECTAL | Status: DC | PRN

## 2014-03-07 MED ORDER — FENTANYL CITRATE 0.05 MG/ML IJ SOLN
INTRAMUSCULAR | Status: DC | PRN
  Administered 2014-03-07 (×2): 50 ug via INTRAVENOUS

## 2014-03-07 MED ORDER — RIVAROXABAN 10 MG PO TABS
10.0000 mg | ORAL_TABLET | Freq: Every day | ORAL | Status: DC
  Administered 2014-03-08 – 2014-03-09 (×2): 10 mg via ORAL
  Filled 2014-03-07 (×3): qty 1

## 2014-03-07 MED ORDER — ONDANSETRON HCL 4 MG/2ML IJ SOLN
INTRAMUSCULAR | Status: DC | PRN
  Administered 2014-03-07: 4 mg via INTRAVENOUS

## 2014-03-07 MED ORDER — MENTHOL 3 MG MT LOZG: 1.0000 | LOZENGE | OROMUCOSAL | Status: DC | PRN

## 2014-03-07 MED ORDER — POLYETHYLENE GLYCOL 3350 17 G PO PACK: 17.0000 g | PACK | Freq: Every day | ORAL | Status: DC | PRN

## 2014-03-07 MED ORDER — INFLUENZA VAC SPLIT QUAD 0.5 ML IM SUSY
0.5000 mL | PREFILLED_SYRINGE | INTRAMUSCULAR | Status: AC
  Administered 2014-03-08: 0.5 mL via INTRAMUSCULAR
  Filled 2014-03-07: qty 0.5

## 2014-03-07 MED ORDER — CEFAZOLIN SODIUM-DEXTROSE 2-3 GM-% IV SOLR
2.0000 g | Freq: Once | INTRAVENOUS | Status: AC
  Administered 2014-03-07: 2 g via INTRAVENOUS
  Filled 2014-03-07: qty 50

## 2014-03-07 MED ORDER — METOCLOPRAMIDE HCL 5 MG/ML IJ SOLN: 5.0000 mg | Freq: Three times a day (TID) | INTRAMUSCULAR | Status: DC | PRN

## 2014-03-07 MED ORDER — ONDANSETRON HCL 4 MG/2ML IJ SOLN: 4.0000 mg | Freq: Four times a day (QID) | INTRAMUSCULAR | Status: DC | PRN

## 2014-03-07 MED ORDER — ENOXAPARIN SODIUM 40 MG/0.4ML ~~LOC~~ SOLN
40.0000 mg | SUBCUTANEOUS | Status: DC
  Filled 2014-03-07: qty 0.4

## 2014-03-07 MED ORDER — HYDROCODONE-ACETAMINOPHEN 5-325 MG PO TABS
ORAL_TABLET | ORAL | Status: AC
  Administered 2014-03-07: 2 via ORAL
  Filled 2014-03-07: qty 2

## 2014-03-07 SURGICAL SUPPLY — 44 items
BLADE SAGITTAL (BLADE) ×2
BLADE SAW THK.89X75X18XSGTL (BLADE) ×1 IMPLANT
COVER SURGICAL LIGHT HANDLE (MISCELLANEOUS) ×3 IMPLANT
DRAPE HIP W/POCKET STRL (DRAPE) ×3 IMPLANT
DRAPE INCISE IOBAN 85X60 (DRAPES) ×3 IMPLANT
DRAPE U-SHAPE 47X51 STRL (DRAPES) ×3 IMPLANT
DRSG AQUACEL AG ADV 3.5X10 (GAUZE/BANDAGES/DRESSINGS) ×3 IMPLANT
DRSG MEPILEX BORDER 4X8 (GAUZE/BANDAGES/DRESSINGS) ×3 IMPLANT
DURAPREP 26ML APPLICATOR (WOUND CARE) ×6 IMPLANT
ELECT CAUTERY BLADE 6.4 (BLADE) ×3 IMPLANT
ELECT REM PT RETURN 9FT ADLT (ELECTROSURGICAL) ×3
ELECTRODE REM PT RTRN 9FT ADLT (ELECTROSURGICAL) ×1 IMPLANT
EVACUATOR 1/8 PVC DRAIN (DRAIN) IMPLANT
FACESHIELD WRAPAROUND (MASK) ×3 IMPLANT
GLOVE BIOGEL PI IND STRL 7.0 (GLOVE) ×2 IMPLANT
GLOVE BIOGEL PI INDICATOR 7.0 (GLOVE) ×4
GLOVE ECLIPSE 6.5 STRL STRAW (GLOVE) ×6 IMPLANT
GLOVE SURG SYN 7.5  E (GLOVE) ×4
GLOVE SURG SYN 7.5 E (GLOVE) ×2 IMPLANT
GOWN STRL REUS W/ TWL LRG LVL3 (GOWN DISPOSABLE) ×2 IMPLANT
GOWN STRL REUS W/TWL LRG LVL3 (GOWN DISPOSABLE) ×4
HANDPIECE INTERPULSE COAX TIP (DISPOSABLE) ×2
HIP UNIPOLAR CEM FRM LG MET HD ×3 IMPLANT
IMMOBILIZER KNEE 20 (SOFTGOODS) ×3
IMMOBILIZER KNEE 20 THIGH 36 (SOFTGOODS) ×1 IMPLANT
KIT BASIN OR (CUSTOM PROCEDURE TRAY) ×3 IMPLANT
KIT ROOM TURNOVER OR (KITS) ×3 IMPLANT
MANIFOLD NEPTUNE II (INSTRUMENTS) ×3 IMPLANT
NS IRRIG 1000ML POUR BTL (IV SOLUTION) ×3 IMPLANT
PACK SHOULDER (CUSTOM PROCEDURE TRAY) ×3 IMPLANT
PAD ARMBOARD 7.5X6 YLW CONV (MISCELLANEOUS) ×6 IMPLANT
SET HNDPC FAN SPRY TIP SCT (DISPOSABLE) ×1 IMPLANT
STAPLER VISISTAT 35W (STAPLE) ×3 IMPLANT
SUT ETHIBOND 2 V 37 (SUTURE) ×6 IMPLANT
SUT PDS AB 0 CT 36 (SUTURE) ×3 IMPLANT
SUT PDS AB 1 CT  36 (SUTURE) ×4
SUT PDS AB 1 CT 36 (SUTURE) ×2 IMPLANT
SUT VIC AB 0 CT1 27 (SUTURE)
SUT VIC AB 0 CT1 27XBRD ANBCTR (SUTURE) IMPLANT
SUT VIC AB 1 CTB1 27 (SUTURE) IMPLANT
SUT VIC AB 2-0 CT1 27 (SUTURE) ×4
SUT VIC AB 2-0 CT1 TAPERPNT 27 (SUTURE) ×2 IMPLANT
TOWEL OR 17X24 6PK STRL BLUE (TOWEL DISPOSABLE) ×3 IMPLANT
TOWEL OR 17X26 10 PK STRL BLUE (TOWEL DISPOSABLE) ×3 IMPLANT

## 2014-03-07 NOTE — ED Notes (Signed)
Pt in radiology 

## 2014-03-07 NOTE — Op Note (Signed)
RIGHT HIP HEMI ARTHROPLASTY  Hanne Kegg   [id number]  Pre-op Diagnosis: right hip fracture     Post-op Diagnosis: same   Operative Procedures  1. Open treatment of femoral fracture, proximal end, neck, prosthetic replacement CPT [phone]  Personnel  Surgeon(s): Naiping Eduard Roux, MD   Anesthesia: LMA, spinal  Prosthesis: [employer] Femur: Polar Size 2 HO Head: 46 size: -3 Bearing Type: Bipolar  Date of Service: 03/07/2014  Indication: 79 y.o.. year old female who suffered a ground level fall and was found to have sustained a Right hip fracture. The patient was found to have a hip fracture that was appropriate for operative management. We reviewed the risk and benefits with the patient and family and they elected to proceed.  Procedure: After informed consent was obtained and understanding of the risk were voiced including but not limited to bleeding, infection, damage to surrounding structures including nerves and vessels, blood clots, leg length inequality, dislocation and the failure to achieve desired results, including death, the operative extremity was marked with verbal confirmation of the patient in the holding area.   The patient was then brought to the operating room and transported to the operating room table and placed in the lateral decubitus position. The operative limb was then prepped and draped in the usual sterile fashion and preoperative antibiotics were administered.  A time out was performed prior to the start of surgery confirming the correct extremity, preoperative antibiotic administration, as well as team members, and that implants and instruments available for the case. Correct surgical site was also confirmed with preoperative radiographs. A standard posterior approach to the hip was performed. A capsulotomy was performed and the capsule tagged for later repair. The labrum was preserved. The hip was dislocated and the femoral neck cut was made at  approximately 1.5 cm above the level of the lesser trochanter using the femoral neck cutting guide. We then used a corkscrew and cobb to remove the femoral head from the acetabulum. We measured the head and proceeded to trial head sizes and found 46 mm to the the appropriate size. We then turned our attention to femoral preparation. We began sequential broaching to a size 2 high offset stem. This size produced good fit and rotational stability. We placed the trial neck and a 46 mm -3 head.  Leg lengths were evaluated on the table. The hip was stable in extension and external rotation without impingement. The hip was stable in deep flexion. In 90 degrees of flexion and neutral abduction the hip was stable to 65 degrees of internal rotation. In a position of sleep with hip adducted across the body the hip was stable to 60 degrees of rotation.  We then turned back to the femur and tested the broach for stability and fit, and removed the trial broach. The final femoral implant was placed, and the trial head was again trialed for stability. We then irrigated and dried the trunion, and the final head was placed. We placed an 46 mm -3 head. The hip was again reduced, and taken through a range of motion and found to be stable as above. The hip was thoroughly irrigated, and a posterior capsular repair was performed with #2 Ethibond. The wound was irrigated with normal saline. The deep fascia was closed with 1 PDS, 0 vicryl for the deep fat layer, and 2.0 Vicryl Plus for the subcutaneous tissue. The skin was closed with staples. A sterile dressing was applied. The patient was awakened and  transported to the recovery room in stable condition. All sponge, needle, and instrument counts were correct at the end of the case.  Position: lateral decubitus   Complications: none.  Time Out: performed   Drains/Packing: none  Estimated blood loss: 250 cc  Returned to Recovery Room: in good condition.   Antibiotics: yes     Mechanical VTE (DVT) Prophylaxis: sequential compression devices, TED thigh-high  Chemical VTE (DVT) Prophylaxis: xarelto  Fluid Replacement  Crystalloid: see anesthesia record Blood: none  FFP: none   Specimens Removed: 1 to pathology   Sponge and Instrument Count Correct? yes   PACU: portable radiograph - low AP pelvis  Admission: inpatient status, start PT & OT POD#1  Plan/RTC: Return in 2 weeks for wound check.  Weight Bearing/Load Lower Extremity: full  Posterior hip precautions  N. Eduard Roux, MD [place] 5:35 PM
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
## 2014-03-07 NOTE — Anesthesia Postprocedure Evaluation (Signed)
  Anesthesia Post-op Note  Patient: [name]  Procedure(s) Performed: Procedure(s): RIGHT HIP HEMI ARTHROPLASTY (Right)  Patient Location: PACU  Anesthesia Type:General  Level of Consciousness: awake, alert  and oriented  Airway and Oxygen Therapy: Patient Spontanous Breathing and Patient connected to nasal cannula oxygen  Post-op Pain: mild  Post-op Assessment: Post-op Vital signs reviewed, Patient's Cardiovascular Status Stable, Respiratory Function Stable, Patent Airway, No signs of Nausea or vomiting and Pain level controlled  Post-op Vital Signs: stable  Last Vitals:  Filed Vitals:   03/07/14 1841  BP: 132/60  Pulse: 76  Temp: 36.5 C  Resp: 18    Complications: No apparent anesthesia complications
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
## 2014-03-07 NOTE — Anesthesia Preprocedure Evaluation (Signed)
Anesthesia Evaluation  Patient identified by MRN, date of birth, ID band Patient awake    Reviewed: Allergy & Precautions, H&P , NPO status , Patient's Chart, lab work & pertinent test results  Airway Mallampati: II TM Distance: >3 FB Neck ROM: Full    Dental  (+) Edentulous Upper, Edentulous Lower   Pulmonary Current Smoker,  + rhonchi   + decreased breath sounds      Cardiovascular hypertension, Rhythm:Regular Rate:Normal     Neuro/Psych    GI/Hepatic   Endo/Other    Renal/GU      Musculoskeletal   Abdominal   Peds  Hematology   Anesthesia Other Findings   Reproductive/Obstetrics                           Anesthesia Physical Anesthesia Plan  ASA: III  Anesthesia Plan: Spinal   Post-op Pain Management:    Induction:   Airway Management Planned: Natural Airway and Simple Face Mask  Additional Equipment:   Intra-op Plan:   Post-operative Plan:   Informed Consent: I have reviewed the patients History and Physical, chart, labs and discussed the procedure including the risks, benefits and alternatives for the proposed anesthesia with the patient or authorized representative who has indicated his/her understanding and acceptance.     Plan Discussed with: CRNA and Anesthesiologist  Anesthesia Plan Comments:         Anesthesia Quick Evaluation
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
## 2014-03-07 NOTE — ED Notes (Signed)
Pt to radiology via stretcher.  

## 2014-03-07 NOTE — ED Notes (Signed)
MD at bedside. 

## 2014-03-07 NOTE — Anesthesia Procedure Notes (Addendum)
Spinal  Patient location during procedure: OR Start time: 03/07/2014 3:58 PM End time: 03/07/2014 4:00 PM Staffing Performed by: anesthesiologist  Preanesthetic Checklist Completed: patient identified, site marked, surgical consent, pre-op evaluation, timeout performed, IV checked, risks and benefits discussed and monitors and equipment checked Spinal Block Patient position: right lateral decubitus Prep: Betadine Patient monitoring: cardiac monitor, continuous pulse ox, blood pressure and heart rate Approach: midline Location: L4-5 Injection technique: single-shot Needle Needle type: Tuohy  Needle gauge: 22 G Needle length: 9 cm Assessment Sensory level: T8 Additional Notes Clear CSF 7 mg 0.75% marcaine injected easily  Procedure Name: MAC Date/Time: 03/07/2014 3:50 PM Performed by: Jacquiline Doe A Pre-anesthesia Checklist: Patient identified, Timeout performed, Emergency Drugs available, Suction available and Patient being monitored Patient Re-evaluated:Patient Re-evaluated prior to inductionOxygen Delivery Method: Nasal cannula Intubation Type: IV induction Placement Confirmation: positive ETCO2 Dental Injury: Teeth and Oropharynx as per pre-operative assessment     Procedure Name: LMA Insertion Date/Time: 03/07/2014 4:23 PM Performed by: Jacquiline Doe A Pre-anesthesia Checklist: Patient identified, Timeout performed, Emergency Drugs available, Suction available and Patient being monitored Patient Re-evaluated:Patient Re-evaluated prior to inductionOxygen Delivery Method: Circle system utilized Preoxygenation: Pre-oxygenation with 100% oxygen Intubation Type: IV induction Ventilation: Mask ventilation without difficulty LMA: LMA inserted LMA Size: 3.0 Tube type: Oral Number of attempts: 1 Placement Confirmation: breath sounds checked- equal and bilateral and positive ETCO2 Tube secured with: Tape Dental Injury: Teeth and Oropharynx as per pre-operative assessment
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
## 2014-03-07 NOTE — Transfer of Care (Signed)
Immediate Anesthesia Transfer of Care Note  Patient: [name]  Procedure(s) Performed: Procedure(s): RIGHT HIP HEMI ARTHROPLASTY (Right)  Patient Location: PACU  Anesthesia Type:MAC, General, Regional, MAC combined with regional for post-op pain and GA combined with regional for post-op pain  Level of Consciousness: awake, oriented, patient cooperative and responds to stimulation  Airway & Oxygen Therapy: Patient Spontanous Breathing and Patient connected to nasal cannula oxygen  Post-op Assessment: Report given to PACU RN, Post -op Vital signs reviewed and stable, Patient moving all extremities and Patient moving all extremities X 4  Post vital signs: Reviewed and stable  Complications: No apparent anesthesia complications
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
## 2014-03-07 NOTE — Progress Notes (Signed)
Orthopedic Tech Progress Note Patient Details:  [name] 1935/05/19 [id number] Patient refused ohf Patient ID: Beryl Meager, female   DOB: 07/02/1934, 79 y.o.   MRN: [id number]   Braulio Bosch 03/07/2014, 7:55 PM

## 2014-03-08 ENCOUNTER — Inpatient Hospital Stay (HOSPITAL_COMMUNITY): Payer: Medicare Other

## 2014-03-08 DIAGNOSIS — S72031A Displaced midcervical fracture of right femur, initial encounter for closed fracture: Secondary | ICD-10-CM | POA: Diagnosis not present

## 2014-03-08 DIAGNOSIS — E43 Unspecified severe protein-calorie malnutrition: Secondary | ICD-10-CM | POA: Insufficient documentation

## 2014-03-08 LAB — PRO B NATRIURETIC PEPTIDE: PRO B NATRI PEPTIDE: 3701 pg/mL — AB (ref 0–450)

## 2014-03-08 LAB — BASIC METABOLIC PANEL
Anion gap: 8 (ref 5–15)
BUN: 13 mg/dL (ref 6–23)
CHLORIDE: 98 meq/L (ref 96–112)
CO2: 30 meq/L (ref 19–32)
CREATININE: 0.58 mg/dL (ref 0.50–1.10)
Calcium: 7.8 mg/dL — ABNORMAL LOW (ref 8.4–10.5)
GFR calc Af Amer: >90 mL/min (ref >90)
GFR calc non Af Amer: 87 mL/min — ABNORMAL LOW (ref >90)
GLUCOSE: 148 mg/dL — AB (ref 70–99)
Potassium: 4 mEq/L (ref 3.7–5.3)
Sodium: 136 mEq/L — ABNORMAL LOW (ref 137–147)

## 2014-03-08 LAB — CBC
HEMATOCRIT: 29 % — AB (ref 36.0–46.0)
HEMOGLOBIN: 9.3 g/dL — AB (ref 12.0–15.0)
MCH: 29.4 pg (ref 26.0–34.0)
MCHC: 32.1 g/dL (ref 30.0–36.0)
MCV: 91.8 fL (ref 78.0–100.0)
Platelets: 301 10*3/uL (ref 150–400)
RBC: 3.16 MIL/uL — AB (ref 3.87–5.11)
RDW: 17.5 % — ABNORMAL HIGH (ref 11.5–15.5)
WBC: 7.2 10*3/uL (ref 4.0–10.5)

## 2014-03-08 LAB — D-DIMER, QUANTITATIVE: D-Dimer, Quant: 1.36 ug/mL-FEU — ABNORMAL HIGH (ref 0.00–0.48)

## 2014-03-08 MED ORDER — PNEUMOCOCCAL VAC POLYVALENT 25 MCG/0.5ML IJ INJ
0.5000 mL | INJECTION | INTRAMUSCULAR | Status: AC
  Administered 2014-03-09: 0.5 mL via INTRAMUSCULAR
  Filled 2014-03-08: qty 0.5

## 2014-03-08 MED ORDER — ENSURE COMPLETE PO LIQD
237.0000 mL | Freq: Two times a day (BID) | ORAL | Status: DC
  Administered 2014-03-08 – 2014-03-09 (×2): 237 mL via ORAL

## 2014-03-08 NOTE — Clinical Documentation Improvement (Signed)
According to the documented Height and Weight in [facility]/EPIC, the patients BMI is 17.36. If your clinical findings/judgment agrees with this could you please help by documenting  any associated diagnoses/conditions, such as morbid obesity, underweight, malnutrition (type), etc  Supporting Information: Estimated body mass index is 17.36 kg/(m^2) as calculated from the following:   Height as of this encounter: 5\' 3"  (1.6 m).   Weight as of this encounter: 98 lb (44.453 kg).   Thank you for your time with this!   Almon Register, RN Clinical Documentation Improvement Specialist ([phone] / [phone]
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
## 2014-03-08 NOTE — Progress Notes (Signed)
Utilization review completed.  
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
## 2014-03-08 NOTE — Significant Event (Signed)
Rapid Response Event Note  Overview: Time Called: 1427 Arrival Time: 1432 Event Type: Respiratory  Initial Focused Assessment:  Called by primary Rn for patient desatting in the 70's after working with therapy.  Upon my arrival to patients room, RN at bedside.  Patient sitting in chair, on NRB mask sats 110%, HR 108.  Patient slightly labored breathing, RR 22-24.  Patient states she is having a little trouble breathing.     Interventions:  Changed oxygen to nasal cannula at 3LPm.  Sats 88-93%.  Dr. Verlon Au paged and updated, now at bedside.  Orders received by primary RN.  RN to call if assistance needed   Event Summary:   at      at          [facility], Harlin Rain

## 2014-03-09 ENCOUNTER — Inpatient Hospital Stay (HOSPITAL_COMMUNITY): Payer: Medicare Other

## 2014-03-09 ENCOUNTER — Encounter (HOSPITAL_COMMUNITY): Payer: Self-pay | Admitting: Orthopaedic Surgery

## 2014-03-09 DIAGNOSIS — S72031A Displaced midcervical fracture of right femur, initial encounter for closed fracture: Secondary | ICD-10-CM | POA: Diagnosis not present

## 2014-03-09 LAB — BASIC METABOLIC PANEL
ANION GAP: 8 (ref 5–15)
BUN: 8 mg/dL (ref 6–23)
CALCIUM: 7.7 mg/dL — AB (ref 8.4–10.5)
CO2: 32 mEq/L (ref 19–32)
Chloride: 96 mEq/L (ref 96–112)
Creatinine, Ser: 0.4 mg/dL — ABNORMAL LOW (ref 0.50–1.10)
GFR calc non Af Amer: >90 mL/min (ref >90)
Glucose, Bld: 123 mg/dL — ABNORMAL HIGH (ref 70–99)
Potassium: 3.8 mEq/L (ref 3.7–5.3)
SODIUM: 136 meq/L — AB (ref 137–147)

## 2014-03-09 LAB — CBC
HCT: 25.1 % — ABNORMAL LOW (ref 36.0–46.0)
Hemoglobin: 8.2 g/dL — ABNORMAL LOW (ref 12.0–15.0)
MCH: 30 pg (ref 26.0–34.0)
MCHC: 32.7 g/dL (ref 30.0–36.0)
MCV: 91.9 fL (ref 78.0–100.0)
PLATELETS: 293 10*3/uL (ref 150–400)
RBC: 2.73 MIL/uL — ABNORMAL LOW (ref 3.87–5.11)
RDW: 17.4 % — AB (ref 11.5–15.5)
WBC: 7.3 10*3/uL (ref 4.0–10.5)

## 2014-03-09 MED ORDER — TECHNETIUM TO 99M ALBUMIN AGGREGATED
6.0000 | Freq: Once | INTRAVENOUS | Status: AC | PRN
  Administered 2014-03-09: 6 via INTRAVENOUS

## 2014-03-09 MED ORDER — SORBITOL 70 % SOLN: 30.0000 mL | Freq: Every day | Status: DC | PRN

## 2014-03-09 MED ORDER — TECHNETIUM TC 99M DIETHYLENETRIAME-PENTAACETIC ACID: 40.0000 | Freq: Once | INTRAVENOUS | Status: DC | PRN

## 2014-03-09 NOTE — Progress Notes (Signed)
   Subjective:  Patient reports pain as moderate.  No events.  Objective:   VITALS:   Filed Vitals:   03/08/14 2036 03/08/14 2337 03/09/14 0400 03/09/14 0632  BP: 101/36   134/46  Pulse: 88   86  Temp: 98.3 F (36.8 C)   98.4 F (36.9 C)  TempSrc: Oral   Oral  Resp:  20 20   Height:      Weight:      SpO2: 91% 93% 94% 95%    Neurovascular intact Sensation intact distally Intact pulses distally Dorsiflexion/Plantar flexion intact Incision: dressing C/D/I and no drainage No cellulitis present Compartment soft Slightly confused   Lab Results  Component Value Date   WBC 7.3 03/09/2014   HGB 8.2* 03/09/2014   HCT 25.1* 03/09/2014   MCV 91.9 03/09/2014   PLT 293 03/09/2014     Assessment/Plan:  2 Days Post-Op   - Expected postop acute blood loss anemia - will monitor for symptoms - Up with PT/OT - rec SNF - DVT ppx - SCDs, ambulation, xarelto - WBAT right lower extremity, posterior hip precautions - KI at all times - Pain control - Discharge planning - needs to go to SNF  Marianna Payment 03/09/2014, 7:55 AM [phone]
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
## 2014-03-09 NOTE — Progress Notes (Signed)
Came to visit patient prior to discharge to SNF. Explained services and consents obtained. Patient will have follow up from [facility] while at [facility]. Patient provided contact name and number for her daughter, [name]. Appreciative of visit. Provided [facility] Care Management packet and contact information.  Marthenia Rolling, MSN-RN,BSN- [facility] [phone]
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
## 2014-03-09 NOTE — Progress Notes (Signed)
OT Cancellation Note  Patient Details Name: [name] MRN: [id number] DOB: [birth year]   Cancelled Treatment:    Reason Eval/Treat Not Completed: Patient at procedure or test/ unavailable. Will re attempt later  as time allows  Britt Bottom 03/09/2014, 10:40 AM

## 2014-03-09 NOTE — Progress Notes (Signed)
Patient discharged to [facility]. Report called to [facility], RN at facility. IV was removed and patient left unit in a stable condition via EMS.

## 2014-03-09 NOTE — Clinical Social Work Note (Signed)
CSW intern left message for daughter, Daine Floras, per pt request to discuss disposition.  CSW awaiting a return call.  Nonnie Done, [place] [phone]  Psychiatric & Orthopedics (5N 1-16) Clinical Social Worker
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
## 2014-03-09 NOTE — Clinical Social Work Placement (Signed)
Clinical Social Work Department CLINICAL SOCIAL WORK PLACEMENT NOTE 03/09/2014  Patient:  LOURA, PITT  Account Number:  [id number] Admit date:  03/07/2014  Clinical Social Worker:  Wylene Men  Date/time:  03/09/2014 01:24 PM  Clinical Social Work is seeking post-discharge placement for this patient at the following level of care:   [place]   (*CSW will update this form in Epic as items are completed)   03/09/2014  Patient/family provided with [facility] Department of Clinical Social Work's list of facilities offering this level of care within the geographic area requested by the patient (or if unable, by the patient's family).  03/09/2014  Patient/family informed of their freedom to choose among providers that offer the needed level of care, that participate in Medicare, Medicaid or managed care program needed by the patient, have an available bed and are willing to accept the patient.  03/09/2014  Patient/family informed of [facility]' ownership interest in [facility], as well as of the fact that they are under no obligation to receive care at this facility.  PASARR submitted to EDS on 03/09/2014 PASARR number received on 03/09/2014  FL2 transmitted to all facilities in geographic area requested by pt/family on  03/09/2014 FL2 transmitted to all facilities within larger geographic area on 03/09/2014  Patient informed that his/her managed care company has contracts with or will negotiate with  certain facilities, including the following:     Patient/family informed of bed offers received:  03/09/2014 Patient chooses bed at [facility] [place] Physician recommends and patient chooses bed at    Patient to be transferred to [facility] on  03/09/2014 Patient to be transferred to facility by [facility] Patient and family notified of transfer on 03/09/2014 Name of family member notified:  [name] and pt at  bedside  The following physician request were entered in Epic:   Additional Comments: CSW notified family of discharge and all are agreeable.  Nonnie Done, [place] [phone]  Psychiatric & Orthopedics (5N 1-16) Clinical Social Worker

## 2014-03-09 NOTE — Discharge Instructions (Signed)
1. Take aspirin 325 twice a day for 4 weeks after finishing xarelto 2. Posterior hip precautions as taught by physical therapy 3. May take shower after 10 days after surgery 4. Take xarelto to prevent blood clots   Information on my medicine - XARELTO (Rivaroxaban)  Why was Xarelto prescribed for you? Xarelto was prescribed for you to reduce the risk of blood clots forming after orthopedic surgery. The medical term for these abnormal blood clots is venous thromboembolism (VTE).  What do you need to know about xarelto ? Take your Xarelto ONCE DAILY at the same time every day. You may take it either with or without food.  If you have difficulty swallowing the tablet whole, you may crush it and mix in applesauce just prior to taking your dose.  Take Xarelto exactly as prescribed by your doctor and DO NOT stop taking Xarelto without talking to the doctor who prescribed the medication.  Stopping without other VTE prevention medication to take the place of Xarelto may increase your risk of developing a clot.  After discharge, you should have regular check-up appointments with your healthcare provider that is prescribing your Xarelto.    What do you do if you miss a dose? If you miss a dose, take it as soon as you remember on the same day then continue your regularly scheduled once daily regimen the next day. Do not take two doses of Xarelto on the same day.   Important Safety Information A possible side effect of Xarelto is bleeding. You should call your healthcare provider right away if you experience any of the following:   Bleeding from an injury or your nose that does not stop.   Unusual colored urine (red or dark brown) or unusual colored stools (red or black).   Unusual bruising for unknown reasons.   A serious fall or if you hit your head (even if there is no bleeding).  Some medicines may interact with Xarelto and might increase your risk of bleeding while on Xarelto. To  help avoid this, consult your healthcare provider or pharmacist prior to using any new prescription or non-prescription medications, including herbals, vitamins, non-steroidal anti-inflammatory drugs (NSAIDs) and supplements.  This website has more information on Xarelto: [email or web address]

## 2014-03-10 NOTE — ED Provider Notes (Signed)
Medical screening examination/treatment/procedure(s) were conducted as a shared visit with non-physician practitioner(s) and myself.  I personally evaluated the patient during the encounter.   EKG Interpretation   Date/Time:  Sunday March 07 2014 11:31:45 EDT Ventricular Rate:  84 PR Interval:  150 QRS Duration: 87 QT Interval:  388 QTC Calculation: 459 R Axis:   77 Text Interpretation:  Sinus rhythm Probable anteroseptal infarct, old ED  PHYSICIAN INTERPRETATION AVAILABLE IN [place] Confirmed by TEST,  Record ([id number]) on 03/09/2014 7:42:46 AM         Artis Delay, MD 03/10/14 [phone]

## 2014-03-15 ENCOUNTER — Encounter (HOSPITAL_COMMUNITY): Payer: Self-pay | Admitting: Emergency Medicine

## 2014-03-15 ENCOUNTER — Emergency Department (HOSPITAL_COMMUNITY): Payer: Medicare Other

## 2014-03-15 ENCOUNTER — Inpatient Hospital Stay (HOSPITAL_COMMUNITY)
Admission: EM | Admit: 2014-03-15 | Discharge: 2014-03-17 | DRG: 193 | Disposition: A | Discharge status: Skilled Nursing Facility | Payer: Medicare Other | Attending: Internal Medicine | Admitting: Internal Medicine

## 2014-03-15 DIAGNOSIS — C3492 Malignant neoplasm of unspecified part of left bronchus or lung: Secondary | ICD-10-CM

## 2014-03-15 DIAGNOSIS — J811 Chronic pulmonary edema: Secondary | ICD-10-CM | POA: Diagnosis present

## 2014-03-15 DIAGNOSIS — C3432 Malignant neoplasm of lower lobe, left bronchus or lung: Secondary | ICD-10-CM | POA: Diagnosis present

## 2014-03-15 DIAGNOSIS — J441 Chronic obstructive pulmonary disease with (acute) exacerbation: Secondary | ICD-10-CM | POA: Diagnosis present

## 2014-03-15 DIAGNOSIS — J90 Pleural effusion, not elsewhere classified: Secondary | ICD-10-CM

## 2014-03-15 DIAGNOSIS — J9601 Acute respiratory failure with hypoxia: Secondary | ICD-10-CM | POA: Diagnosis present

## 2014-03-15 DIAGNOSIS — J9600 Acute respiratory failure, unspecified whether with hypoxia or hypercapnia: Secondary | ICD-10-CM | POA: Diagnosis present

## 2014-03-15 DIAGNOSIS — Z87442 Personal history of urinary calculi: Secondary | ICD-10-CM

## 2014-03-15 DIAGNOSIS — D638 Anemia in other chronic diseases classified elsewhere: Secondary | ICD-10-CM | POA: Diagnosis present

## 2014-03-15 DIAGNOSIS — F17210 Nicotine dependence, cigarettes, uncomplicated: Secondary | ICD-10-CM | POA: Diagnosis present

## 2014-03-15 DIAGNOSIS — Z96641 Presence of right artificial hip joint: Secondary | ICD-10-CM | POA: Diagnosis present

## 2014-03-15 DIAGNOSIS — I10 Essential (primary) hypertension: Secondary | ICD-10-CM | POA: Diagnosis present

## 2014-03-15 DIAGNOSIS — Z923 Personal history of irradiation: Secondary | ICD-10-CM

## 2014-03-15 DIAGNOSIS — E222 Syndrome of inappropriate secretion of antidiuretic hormone: Secondary | ICD-10-CM | POA: Diagnosis present

## 2014-03-15 DIAGNOSIS — Z8781 Personal history of (healed) traumatic fracture: Secondary | ICD-10-CM | POA: Diagnosis not present

## 2014-03-15 DIAGNOSIS — Z515 Encounter for palliative care: Secondary | ICD-10-CM | POA: Diagnosis not present

## 2014-03-15 DIAGNOSIS — R918 Other nonspecific abnormal finding of lung field: Secondary | ICD-10-CM

## 2014-03-15 DIAGNOSIS — M1990 Unspecified osteoarthritis, unspecified site: Secondary | ICD-10-CM | POA: Diagnosis present

## 2014-03-15 DIAGNOSIS — R0600 Dyspnea, unspecified: Secondary | ICD-10-CM | POA: Diagnosis present

## 2014-03-15 DIAGNOSIS — R0902 Hypoxemia: Secondary | ICD-10-CM

## 2014-03-15 DIAGNOSIS — E43 Unspecified severe protein-calorie malnutrition: Secondary | ICD-10-CM

## 2014-03-15 DIAGNOSIS — Z9981 Dependence on supplemental oxygen: Secondary | ICD-10-CM | POA: Diagnosis not present

## 2014-03-15 DIAGNOSIS — J189 Pneumonia, unspecified organism: Secondary | ICD-10-CM | POA: Diagnosis present

## 2014-03-15 DIAGNOSIS — R0602 Shortness of breath: Secondary | ICD-10-CM

## 2014-03-15 LAB — CBC WITH DIFFERENTIAL/PLATELET
BASOS PCT: 0 % (ref 0–1)
Basophils Absolute: 0 10*3/uL (ref 0.0–0.1)
Eosinophils Absolute: 0.1 10*3/uL (ref 0.0–0.7)
Eosinophils Relative: 1 % (ref 0–5)
HEMATOCRIT: 27.2 % — AB (ref 36.0–46.0)
Hemoglobin: 8.8 g/dL — ABNORMAL LOW (ref 12.0–15.0)
LYMPHS PCT: 6 % — AB (ref 12–46)
Lymphs Abs: 0.4 10*3/uL — ABNORMAL LOW (ref 0.7–4.0)
MCH: 30.1 pg (ref 26.0–34.0)
MCHC: 32.4 g/dL (ref 30.0–36.0)
MCV: 93.2 fL (ref 78.0–100.0)
MONO ABS: 0.6 10*3/uL (ref 0.1–1.0)
MONOS PCT: 9 % (ref 3–12)
NEUTROS ABS: 6.2 10*3/uL (ref 1.7–7.7)
Neutrophils Relative %: 84 % — ABNORMAL HIGH (ref 43–77)
Platelets: 470 10*3/uL — ABNORMAL HIGH (ref 150–400)
RBC: 2.92 MIL/uL — ABNORMAL LOW (ref 3.87–5.11)
RDW: 17.7 % — ABNORMAL HIGH (ref 11.5–15.5)
WBC: 7.3 10*3/uL (ref 4.0–10.5)

## 2014-03-15 LAB — BASIC METABOLIC PANEL
Anion gap: 9 (ref 5–15)
BUN: 15 mg/dL (ref 6–23)
CALCIUM: 8.6 mg/dL (ref 8.4–10.5)
CO2: 33 meq/L — AB (ref 19–32)
CREATININE: 0.46 mg/dL — AB (ref 0.50–1.10)
Chloride: 87 mEq/L — ABNORMAL LOW (ref 96–112)
GFR calc Af Amer: >90 mL/min (ref >90)
GFR calc non Af Amer: >90 mL/min (ref >90)
GLUCOSE: 133 mg/dL — AB (ref 70–99)
Potassium: 4.3 mEq/L (ref 3.7–5.3)
Sodium: 129 mEq/L — ABNORMAL LOW (ref 137–147)

## 2014-03-15 LAB — TROPONIN I: Troponin I: <0.3 ng/mL (ref <0.30)

## 2014-03-15 LAB — I-STAT ARTERIAL BLOOD GAS, ED
Acid-Base Excess: 10 mmol/L — ABNORMAL HIGH (ref 0.0–2.0)
BICARBONATE: 36.1 meq/L — AB (ref 20.0–24.0)
O2 Saturation: 99 %
PCO2 ART: 54.9 mmHg — AB (ref 35.0–45.0)
TCO2: 38 mmol/L (ref 0–100)
pH, Arterial: 7.426 (ref 7.350–7.450)
pO2, Arterial: 138 mmHg — ABNORMAL HIGH (ref 80.0–100.0)

## 2014-03-15 LAB — OSMOLALITY, URINE: Osmolality, Ur: 509 mOsm/kg (ref 390–1090)

## 2014-03-15 LAB — PRO B NATRIURETIC PEPTIDE: Pro B Natriuretic peptide (BNP): 1337 pg/mL — ABNORMAL HIGH (ref 0–450)

## 2014-03-15 LAB — SODIUM, URINE, RANDOM: SODIUM UR: 61 meq/L

## 2014-03-15 MED ORDER — METHYLPREDNISOLONE SODIUM SUCC 125 MG IJ SOLR
125.0000 mg | Freq: Once | INTRAMUSCULAR | Status: AC
  Administered 2014-03-15: 125 mg via INTRAVENOUS
  Filled 2014-03-15: qty 2

## 2014-03-15 MED ORDER — OXYCODONE HCL 5 MG PO TABS: 5.0000 mg | ORAL_TABLET | Freq: Four times a day (QID) | ORAL | Status: DC | PRN

## 2014-03-15 MED ORDER — ALBUTEROL SULFATE (2.5 MG/3ML) 0.083% IN NEBU
2.5000 mg | INHALATION_SOLUTION | RESPIRATORY_TRACT | Status: DC | PRN
  Administered 2014-03-15: 2.5 mg via RESPIRATORY_TRACT
  Filled 2014-03-15: qty 3

## 2014-03-15 MED ORDER — ALBUTEROL (5 MG/ML) CONTINUOUS INHALATION SOLN
10.0000 mg/h | INHALATION_SOLUTION | Freq: Once | RESPIRATORY_TRACT | Status: AC
  Administered 2014-03-15: 10 mg/h via RESPIRATORY_TRACT
  Filled 2014-03-15: qty 20

## 2014-03-15 MED ORDER — IPRATROPIUM BROMIDE 0.02 % IN SOLN
0.5000 mg | Freq: Four times a day (QID) | RESPIRATORY_TRACT | Status: DC
  Administered 2014-03-15 – 2014-03-16 (×4): 0.5 mg via RESPIRATORY_TRACT
  Filled 2014-03-15 (×4): qty 2.5

## 2014-03-15 MED ORDER — IOHEXOL 350 MG/ML SOLN
80.0000 mL | Freq: Once | INTRAVENOUS | Status: AC | PRN
  Administered 2014-03-15: 80 mL via INTRAVENOUS

## 2014-03-15 MED ORDER — IPRATROPIUM BROMIDE 0.02 % IN SOLN
0.5000 mg | Freq: Once | RESPIRATORY_TRACT | Status: AC
  Administered 2014-03-15: 0.5 mg via RESPIRATORY_TRACT
  Filled 2014-03-15: qty 2.5

## 2014-03-15 MED ORDER — DEXTROSE 5 % IV SOLN
1.0000 g | INTRAVENOUS | Status: DC
  Administered 2014-03-15 – 2014-03-16 (×2): 1 g via INTRAVENOUS
  Filled 2014-03-15 (×3): qty 1

## 2014-03-15 MED ORDER — SODIUM CHLORIDE 0.9 % IV BOLUS (SEPSIS)
500.0000 mL | Freq: Once | INTRAVENOUS | Status: AC
  Administered 2014-03-15: 500 mL via INTRAVENOUS

## 2014-03-15 MED ORDER — RIVAROXABAN 10 MG PO TABS
10.0000 mg | ORAL_TABLET | Freq: Every day | ORAL | Status: DC
  Administered 2014-03-16 – 2014-03-17 (×2): 10 mg via ORAL
  Filled 2014-03-15 (×3): qty 1

## 2014-03-15 MED ORDER — LEVALBUTEROL HCL 0.63 MG/3ML IN NEBU
0.6300 mg | INHALATION_SOLUTION | Freq: Four times a day (QID) | RESPIRATORY_TRACT | Status: DC
  Administered 2014-03-16 (×3): 0.63 mg via RESPIRATORY_TRACT
  Filled 2014-03-15 (×8): qty 3

## 2014-03-15 MED ORDER — VANCOMYCIN HCL IN DEXTROSE 1-5 GM/200ML-% IV SOLN
1000.0000 mg | INTRAVENOUS | Status: DC
  Administered 2014-03-15 – 2014-03-16 (×2): 1000 mg via INTRAVENOUS
  Filled 2014-03-15 (×3): qty 200

## 2014-03-15 MED ORDER — METHYLPREDNISOLONE SODIUM SUCC 125 MG IJ SOLR
80.0000 mg | Freq: Two times a day (BID) | INTRAMUSCULAR | Status: DC
  Administered 2014-03-15 – 2014-03-17 (×4): 80 mg via INTRAVENOUS
  Filled 2014-03-15: qty 2
  Filled 2014-03-15 (×4): qty 1.28
  Filled 2014-03-15: qty 2

## 2014-03-15 MED ORDER — SENNA 8.6 MG PO TABS
1.0000 | ORAL_TABLET | Freq: Every day | ORAL | Status: DC
  Administered 2014-03-15 – 2014-03-16 (×2): 8.6 mg via ORAL
  Filled 2014-03-15 (×4): qty 1

## 2014-03-15 MED ORDER — SODIUM CHLORIDE 0.9 % IV SOLN
INTRAVENOUS | Status: AC
  Administered 2014-03-15: 19:00:00 via INTRAVENOUS

## 2014-03-15 MED ORDER — ALBUTEROL SULFATE (2.5 MG/3ML) 0.083% IN NEBU: 2.5000 mg | INHALATION_SOLUTION | RESPIRATORY_TRACT | Status: DC

## 2014-03-15 NOTE — ED Notes (Signed)
Granddaughter Nira Conn [phone]
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
## 2014-03-15 NOTE — ED Notes (Signed)
Pt from [place]. Pt in facility for rehab of right hip fracture. Nursing staff noticed increased respiratory effort. Pt only complain is of hip pain. Pt is showing symptoms of depression and anxiety. Pt has extensive respiratory history (lucg cancer, COPD, HTN), and is noted to have productive cough with yellow sputum.
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
## 2014-03-15 NOTE — ED Notes (Signed)
Hospital bed ordered for pt.
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
## 2014-03-16 ENCOUNTER — Encounter (HOSPITAL_COMMUNITY): Payer: Self-pay | Admitting: General Practice

## 2014-03-16 DIAGNOSIS — J9600 Acute respiratory failure, unspecified whether with hypoxia or hypercapnia: Secondary | ICD-10-CM | POA: Diagnosis present

## 2014-03-16 LAB — CBC
HCT: 23.4 % — ABNORMAL LOW (ref 36.0–46.0)
Hemoglobin: 7.5 g/dL — ABNORMAL LOW (ref 12.0–15.0)
MCH: 29.5 pg (ref 26.0–34.0)
MCHC: 32.1 g/dL (ref 30.0–36.0)
MCV: 92.1 fL (ref 78.0–100.0)
PLATELETS: 367 10*3/uL (ref 150–400)
RBC: 2.54 MIL/uL — ABNORMAL LOW (ref 3.87–5.11)
RDW: 17.8 % — AB (ref 11.5–15.5)
WBC: 4.2 10*3/uL (ref 4.0–10.5)

## 2014-03-16 LAB — COMPREHENSIVE METABOLIC PANEL
ALK PHOS: 73 U/L (ref 39–117)
ALT: 14 U/L (ref 0–35)
AST: 19 U/L (ref 0–37)
Albumin: 1.8 g/dL — ABNORMAL LOW (ref 3.5–5.2)
Anion gap: 10 (ref 5–15)
BUN: 12 mg/dL (ref 6–23)
CHLORIDE: 94 meq/L — AB (ref 96–112)
CO2: 29 mEq/L (ref 19–32)
Calcium: 8.4 mg/dL (ref 8.4–10.5)
Creatinine, Ser: 0.31 mg/dL — ABNORMAL LOW (ref 0.50–1.10)
GFR calc non Af Amer: >90 mL/min (ref >90)
GLUCOSE: 191 mg/dL — AB (ref 70–99)
POTASSIUM: 4.5 meq/L (ref 3.7–5.3)
Sodium: 133 mEq/L — ABNORMAL LOW (ref 137–147)
Total Bilirubin: 0.3 mg/dL (ref 0.3–1.2)
Total Protein: 5.8 g/dL — ABNORMAL LOW (ref 6.0–8.3)

## 2014-03-16 LAB — TROPONIN I
Troponin I: <0.3 ng/mL (ref <0.30)
Troponin I: <0.3 ng/mL (ref <0.30)

## 2014-03-16 LAB — MRSA PCR SCREENING: MRSA by PCR: NEGATIVE

## 2014-03-16 LAB — OSMOLALITY: Osmolality: 280 mOsm/kg (ref 275–300)

## 2014-03-16 MED ORDER — SODIUM CHLORIDE 0.9 % IV SOLN: INTRAVENOUS | Status: AC

## 2014-03-16 MED ORDER — CETYLPYRIDINIUM CHLORIDE 0.05 % MT LIQD
7.0000 mL | Freq: Two times a day (BID) | OROMUCOSAL | Status: DC
  Administered 2014-03-16 – 2014-03-17 (×3): 7 mL via OROMUCOSAL

## 2014-03-16 MED ORDER — LEVALBUTEROL HCL 0.63 MG/3ML IN NEBU
0.6300 mg | INHALATION_SOLUTION | Freq: Three times a day (TID) | RESPIRATORY_TRACT | Status: DC
  Administered 2014-03-16 – 2014-03-17 (×2): 0.63 mg via RESPIRATORY_TRACT
  Filled 2014-03-16 (×5): qty 3

## 2014-03-16 MED ORDER — IPRATROPIUM BROMIDE 0.02 % IN SOLN
0.5000 mg | Freq: Three times a day (TID) | RESPIRATORY_TRACT | Status: DC
  Administered 2014-03-16 – 2014-03-17 (×2): 0.5 mg via RESPIRATORY_TRACT
  Filled 2014-03-16 (×2): qty 2.5

## 2014-03-16 NOTE — ED Notes (Signed)
Respiratory called to inquire about xopenex neb.
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
## 2014-03-16 NOTE — ED Notes (Signed)
Cranberry juice given to pt.

## 2014-03-16 NOTE — Progress Notes (Signed)
[facility] PROGRESS NOTE  [name] KTG:[id number] DOB: [birth year] DOA: 03/15/2014 PCP: [name], MD  Assessment/Plan: Active Problems:   Dyspnea   COPD with acute exacerbation   Acute respiratory failure    Acute Copd exacerbation  PNA vs mild pulmonary edema , tolerated IV hydration yesterday Continue iv steroids, nebs, broad spectrum abx , cefepime, vancomycin for possible pneumonia No PE , left lower lobe collapse given history of lung cancer Continue on telemetry in stable overnight  Follow blood culture  HTN (hypertension)-hold losartan 25 daily given low BP , intermittent boluses are fine  Recent hip fracture , stable Continue xarelto for DVT prophylaxsis   Active squamous cell Lung cancer, lower lobe-needs outpatient management. Unclear if fully palliative care at this point , status post radiation treatments up until September 2015, completed therapy on 02/11/14, was scheduled for a repeat CT scan on 10/10, has a followup with Dr. Tammi Klippel on 10/15  Hyponatremia -improving Likely SIADH, secondary to underlying lung cancer, pneumonia Check urine osm,serum osm,  Fluid restriction  monitor Serial BMP   Anemia of chronic disease  Monitor CBC , transfuse for hemoglobin less than 7.0   Code Status: full Family Communication: family updated about patient's clinical progress Disposition Plan:  As above    Brief narrative: 79 year old female history of lung cancer, COPD on 2 L home oxygen, high blood pressure, chronic smoker, recent hip fracture earlier this month and repair currently in [place]. Pt in facility for rehab of right hip fracture. Nursing staff noticed increased respiratory effort. Pt only complain is of hip pain. Pt has extensive respiratory history (lucg cancer, COPD, HTN), and is noted to have productive cough with yellow sputum.  with worsening shortness of breath and hypoxia now requiring 4 L nasal cannula on arrival.Uses oxygen at  baseline 2 L at home and has used this for a while. Patient's had gradually worsening shortness of breath the past week. Patient unsure if this is similar to COPD she has a chronic cough and chronic shortness of breath but this is definitely more significant. No blood clot history.Patient on Xarelto at least 4 weeks post op, starting 03/09/14.  Patient has had a rash to IV contrast dye in the past. Prepped with Benadryl and steroids for CTA To r/o pulmonary embolism. Patient being admitted for hypoxia/ dyspnea/ tachycardia, low sodium.      Consultants:  None  Procedures:  None  Antibiotics: Cefepime Vancomycin HPI/Subjective: Hungry requesting to eat, denies shortness of breath  Objective: Filed Vitals:   03/16/14 0900 03/16/14 0927 03/16/14 0945 03/16/14 1046  BP: 112/45  111/41 110/51  Pulse: 87  89 93  Temp:  97.8 F (36.6 C)  97.7 F (36.5 C)  TempSrc:    Oral  Resp: 18  22 20   Height:      Weight:    49 kg (108 lb 0.4 oz)  SpO2: 92%  92% 100%    Intake/Output Summary (Last 24 hours) at 03/16/14 1053 Last data filed at 03/16/14 0616  Gross per 24 hour  Intake      0 ml  Output    300 ml  Net   -300 ml    Exam:  General: alert & oriented x 3 In NAD  Cardiovascular: RRR, nl S1 s2  Respiratory: Decreased breath sounds at the bases, scattered rhonchi, no crackles  Abdomen: soft +BS NT/ND, no masses palpable  Extremities: No cyanosis and no edema      Data Reviewed: Basic  Metabolic Panel:  Recent Labs Lab 03/15/14 1537 03/16/14 0111  NA 129* 133*  K 4.3 4.5  CL 87* 94*  CO2 33* 29  GLUCOSE 133* 191*  BUN 15 12  CREATININE 0.46* 0.31*  CALCIUM 8.6 8.4    Liver Function Tests:  Recent Labs Lab 03/16/14 0111  AST 19  ALT 14  ALKPHOS 73  BILITOT 0.3  PROT 5.8*  ALBUMIN 1.8*   No results found for this basename: LIPASE, AMYLASE,  in the last 168 hours No results found for this basename: AMMONIA,  in the last 168 hours  CBC:  Recent  Labs Lab 03/15/14 1537 03/16/14 0111  WBC 7.3 4.2  NEUTROABS 6.2  --   HGB 8.8* 7.5*  HCT 27.2* 23.4*  MCV 93.2 92.1  PLT 470* 367    Cardiac Enzymes:  Recent Labs Lab 03/15/14 1537 03/15/14 1924 03/16/14 0111 03/16/14 0724  TROPONINI <0.30 <0.30 <0.30 <0.30   BNP (last 3 results)  Recent Labs  03/08/14 1625 03/15/14 1541  PROBNP 3701.0* 1337.0*     CBG: No results found for this basename: GLUCAP,  in the last 168 hours  No results found for this or any previous visit (from the past 240 hour(s)).   Studies: Dg Chest 1 View  03/07/2014   CLINICAL DATA:  Patient fell yesterday at home, severe COPD and smoking history, current history of lung cancer with recent palliative radiation therapy for left-sided chest pain, recent pneumonia 02/11/2014, mild cough currently, chest pain today  EXAM: CHEST - 1 VIEW  COMPARISON:  11/30/2013 radiographs and 02/05/2014 CT scan  FINDINGS: Hyperinflation is consistent with COPD. The heart size is upper normal. Vascular pattern is normal.  There is diffuse interstitial change which is likely chronic as it is similar to 11/30/2013. There is more focal rounded opacity in the medial right lower lobe measuring about 2.5 cm. There is consolidation in the left lower lobe with associated pleural effusion, small.  Bony thorax is intact.  IMPRESSION: As compared to 02/05/2014 CT scan, there is persistent right lower lobe pneumonia. Persistent left lower lobe consolidation with left effusion also concerning for pneumonia. Known hypermetabolic lung lesion on the left seen on prior PET-CT not identified radiographically.   Electronically Signed   By: Skipper Cliche M.D.   On: 03/07/2014 12:13   Dg Hip Complete Right  03/07/2014   CLINICAL DATA:  fell yesterday, states husband tripped her while walking. Pt can't remember if she fell in the living room or bathroom. Pt c/o right hip pain mostly posterior and lateral. Pt unable to completely straighten right  leg  EXAM: RIGHT HIP - COMPLETE 2+ VIEW  COMPARISON:  CT 10/30/2013 and earlier studies  FINDINGS: There is some mildly impacted mid cervical fracture of the proximal right femur. No dislocation. Old fracture deformities of superior and inferior acute left pubic rami, inferior right pubic ramus.  IMPRESSION: 1. Impacted midcervical right hip fracture.   Electronically Signed   By: Arne Cleveland M.D.   On: 03/07/2014 09:58   Ct Angio Chest Pe W/cm &/or Wo Cm  03/15/2014   CLINICAL DATA:  Progressive shortness of breath and hypoxia. Recent hip fracture. Evaluate for pulmonary embolism.  EXAM: CT ANGIOGRAPHY CHEST WITH CONTRAST  TECHNIQUE: Multidetector CT imaging of the chest was performed using the standard protocol during bolus administration of intravenous contrast. Multiplanar CT image reconstructions and MIPs were obtained to evaluate the vascular anatomy.  CONTRAST:  76mL OMNIPAQUE IOHEXOL 350 MG/ML SOLN  COMPARISON:  02/05/2014; 06/05/2013; chest radiograph - earlier same day ; nuclear medicine VQ scan -03/09/2014; PET-CT- 10/30/2013; CT guided left lower lobe pulmonary nodule biopsy- 11/27/2013.  FINDINGS: Vascular Findings:  There is adequate opacification of the pulmonary arterial system with the main pulmonary artery measuring 497 Hounsfield units. No discrete filling defects are seen within the pulmonary arterial tree to suggest pulmonary embolism. Normal caliber the main pulmonary artery.  Borderline cardiomegaly. Minimal coronary artery calcifications. No pericardial effusion.  Scattered atherosclerotic plaque within a normal caliber thoracic aorta. Conventional configuration of the aortic arch. The branch vessels of the aortic arch widely patent throughout their imaged course. No definite thoracic aortic dissection or periaortic stranding on this nongated examination.  Review of the MIP images confirms the above findings.    ----------------------------------------------------------------------------------  Nonvascular Findings:  Since prior CT performed 02/05/2014, there has been improved aeration of the right lower lobe with persistent ill-defined heterogeneous airspace opacities but with decreased consolidation an Organization. There are minimal linear heterogeneous opacities within the right lower lobe.  There has been progressive consolidation and collapse involving the left lower lobe with development of a small left-sided pleural effusion. No definite pleural nodularity.  There is an ill-defined area of heterogeneous consolidation within the anterior medial aspect of the right upper lobe (image 50, series 6).  Severe paraseptal and centrilobular emphysematous change is re- demonstrated. There is worsening interstitial thickening most conspicuous within the bilateral upper lobes, left greater than right.  Debris is seen within in the right interlobar bronchus (image 40, series 6). There is mild diffuse bronchial wall thickening within the right lower lobe bronchi. The remaining central pulmonary airways are widely patent.  Borderline enlarged right suprahilar lymph nodes with index nodal conglomeration measuring 1.1 cm in greatest short axis diameter (image 44, series 4). Additional scattered shotty mediastinal lymph nodes are individually not enlarged by size criteria with index precarinal lymph node measuring 0.8 cm in greatest short axis diameter. No definitive mediastinal or axillary lymphadenopathy.  Limited early arterial phase evaluation of the upper abdomen demonstrates a punctate (approximately 0.7 cm) hypo attenuating lesion within the dome of the right lobe of the liver (image 79, series 4).  Interval development of a moderate (approximately 40%) compression deformity involving the superior endplate of the U04 vertebral body with minimal (approximately 4 mm) retropulsion of the posterior aspect of the superior endplate  of the V40 vertebral body.  Regional soft tissues appear normal. Normal appearance of the thyroid gland.  IMPRESSION: 1. No evidence of pulmonary embolism. 2. Worsening consolidation and collapse of the left lower lobe, now with development of a small left-sided pleural effusion - while possibly infectious in etiology, underlying malignancy is not excluded on the basis of this examination. Correlation with prior biopsy results from the 11/27/2013 CT-guided lung biopsy is recommended. 3. Suspected mild pulmonary edema superimposed on advanced emphysematous change. 4. Borderline enlarged right suprahilar lymph node go, possibly reactive in etiology. Continued attention on follow-up is recommended. 5. Compared to prior CT performed 02/05/2014, there has been improved aeration of the right lower lobe with persistent ill-defined heterogeneous airspace opacities and debris noted within the right interlobar bronchus - findings suggestive of improved though residual infection and/or aspiration. 6. Interval development of a moderate (approximately 25%) compression deformity involving the superior endplate of the J81 vertebral body with minimal (approximately 4 mm) or retropulsion. Correlation for point tenderness at this location is recommended.   Electronically Signed   By: Eldridge Abrahams.D.  On: 03/15/2014 18:27   Dg Pelvis Portable  03/07/2014   CLINICAL DATA:  Status post right hip replacement after subcapital fracture.  EXAM: PORTABLE PELVIS 1-2 VIEWS  COMPARISON:  Plain films right hip 03/07/2014 at 9:30 a.m.  FINDINGS: Right hip hemiarthroplasty is in place. The device is located. No fracture. Gas the soft tissues and surgical staples noted. Remote pubic rami fractures are identified.  IMPRESSION: Right hip replacement without evidence of complication.   Electronically Signed   By: Inge Rise M.D.   On: 03/07/2014 18:17   Nm Pulmonary Perf And Vent  03/09/2014   CLINICAL DATA:  Non-small cell lung cancer,  COPD, smoker, prior radiation therapy, shortness of breath for months, no chest pain, no history of pulmonary embolism or DVT  EXAM: NUCLEAR MEDICINE VENTILATION - PERFUSION LUNG SCAN  TECHNIQUE: Ventilation images were obtained in multiple projections using inhaled aerosol technetium 99 M DTPA. Perfusion images were obtained in multiple projections after intravenous injection of Tc-66m MAA.  RADIOPHARMACEUTICALS:  40.0 mCi Tc-22m DTPA aerosol inhalation and 6.0 mCi Tc-52m MAA IV  COMPARISON:  None; correlation chest radiograph 03/08/2014  FINDINGS: Ventilation: Markedly abnormal and impaired ventilation exam. Significant central airway deposition of tracer. Large areas of diminished ventilation involving LEFT upper lobe, LEFT lower lobe, RIGHT lower lobe and RIGHT middle lobe.  Perfusion: Perfusion pattern use much better than the ventilation pattern. Less severely impaired perfusion than ventilation within RIGHT middle lobe. Subsegmental perfusion defect LEFT lower lobe though overall LEFT lower lobe demonstrates significantly better perfusion than ventilation.  Chest radiograph demonstrates BILATERAL pleural effusions and basilar infiltrates LEFT greater than RIGHT, underlying COPD, and LEFT mid lung infiltrate.  IMPRESSION: Much better perfusion than ventilation throughout both lungs particularly in LEFT upper lobe.  Better perfusion than ventilation in RIGHT middle lobe.  Better perfusion than ventilation in LEFT lower lobe.  Overall, findings are more suggestive of parenchymal lung disease and represent a low probability for pulmonary embolism.   Electronically Signed   By: Lavonia Dana M.D.   On: 03/09/2014 11:03   Dg Chest Port 1 View  03/15/2014   CLINICAL DATA:  Expiratory distress.  History of COPD.  EXAM: PORTABLE CHEST - 1 VIEW  COMPARISON:  Radiograph 03/08/2014  FINDINGS: Normal cardiac silhouette. Small to moderate left pleural effusion is unchanged. Small right pleural effusion is improved. There  is fine interstitial pattern within the lungs which is unchanged from prior. There is a bibasilar airspace opacities which process similar prior.  IMPRESSION: 1. Improvement in the right pleural effusion. 2. Persistent left pleural effusion and bibasilar infiltrates. 3. Underlying chronic interstitial lung disease.   Electronically Signed   By: Suzy Bouchard M.D.   On: 03/15/2014 16:45   Dg Chest Port 1 View  03/08/2014   CLINICAL DATA:  Shortness of breath, personal history hypertension, COPD, stage IB squamous cell lung cancer LEFT lower lobe post stereotactic radiotherapy, smoker,  EXAM: PORTABLE CHEST - 1 VIEW  COMPARISON:  Portable exam 1545 hr compared to 03/07/2014  FINDINGS: Upper normal heart size.  Slight pulmonary vascular congestion.  Stable mediastinal contours.  Emphysematous changes with bibasilar infiltrates slightly increased.  Small bibasilar pleural effusions larger on LEFT.  LEFT mid lung infiltrates appear slightly increased.  Scarring at RIGHT apex.  No pneumothorax.  Bones demineralized.  IMPRESSION: Bibasilar infiltrates and pleural effusions slightly increased, greater on LEFT.  Underlying emphysematous changes.   Electronically Signed   By: Lavonia Dana M.D.   On: 03/08/2014 16:30  Dg Knee Complete 4 Views Right  03/07/2014   CLINICAL DATA:  fell yesterday, states husband tripped her while walking. Pt can't remember if she fell in the living room or bathroom. Pt c/o diffuse right knee pain. Pt unable to completely straighten right leg  EXAM: RIGHT KNEE - COMPLETE 4+ VIEW  COMPARISON:  None.  FINDINGS: There is no evidence of acute fracture, dislocation, or joint effusion. Mild osteopenia. There is no evidence of arthropathy or other focal bone abnormality. Soft tissues are unremarkable. Old fracture deformity of the mid tibial and fibular shafts, incompletely visualized.  IMPRESSION: Osteopenia without acute abnormality   Electronically Signed   By: Arne Cleveland M.D.   On:  03/07/2014 09:55    Scheduled Meds: . sodium chloride   Intravenous STAT  . ceFEPime (MAXIPIME) IV  1 g Intravenous Q24H  . ipratropium  0.5 mg Nebulization Q6H  . levalbuterol  0.63 mg Nebulization Q6H  . methylPREDNISolone (SOLU-MEDROL) injection  80 mg Intravenous Q12H  . rivaroxaban  10 mg Oral Daily  . senna  1 tablet Oral Daily  . vancomycin  1,000 mg Intravenous Q24H   Continuous Infusions:   Active Problems:   Dyspnea   COPD with acute exacerbation   Acute respiratory failure    Time spent: 40 minutes   [facility] Pager [phone]. If 7PM-7AM, please contact night-coverage at [email or web address], password [facility] 03/16/2014, 10:53 AM  LOS: 1 day

## 2014-03-16 NOTE — ED Notes (Signed)
Admitting MD paged 

## 2014-03-16 NOTE — ED Notes (Signed)
Spoke with Admitting MD about pt's hypotension, please inform if the pressure is lower than 90/40.

## 2014-03-16 NOTE — ED Notes (Signed)
Attempted report 

## 2014-03-16 NOTE — ED Notes (Signed)
Pt placed in a hospital bed and repositioned.

## 2014-03-17 LAB — COMPREHENSIVE METABOLIC PANEL
ALBUMIN: 1.9 g/dL — AB (ref 3.5–5.2)
ALT: 32 U/L (ref 0–35)
ANION GAP: 8 (ref 5–15)
AST: 40 U/L — ABNORMAL HIGH (ref 0–37)
Alkaline Phosphatase: 76 U/L (ref 39–117)
BUN: 13 mg/dL (ref 6–23)
CO2: 32 mEq/L (ref 19–32)
CREATININE: 0.39 mg/dL — AB (ref 0.50–1.10)
Calcium: 8.7 mg/dL (ref 8.4–10.5)
Chloride: 99 mEq/L (ref 96–112)
GFR calc Af Amer: >90 mL/min (ref >90)
GFR calc non Af Amer: >90 mL/min (ref >90)
Glucose, Bld: 154 mg/dL — ABNORMAL HIGH (ref 70–99)
POTASSIUM: 4.7 meq/L (ref 3.7–5.3)
Sodium: 139 mEq/L (ref 137–147)
Total Bilirubin: 0.2 mg/dL — ABNORMAL LOW (ref 0.3–1.2)
Total Protein: 5.6 g/dL — ABNORMAL LOW (ref 6.0–8.3)

## 2014-03-17 LAB — CBC
HCT: 23 % — ABNORMAL LOW (ref 36.0–46.0)
HEMOGLOBIN: 7.4 g/dL — AB (ref 12.0–15.0)
MCH: 29.8 pg (ref 26.0–34.0)
MCHC: 32.2 g/dL (ref 30.0–36.0)
MCV: 92.7 fL (ref 78.0–100.0)
Platelets: 535 10*3/uL — ABNORMAL HIGH (ref 150–400)
RBC: 2.48 MIL/uL — ABNORMAL LOW (ref 3.87–5.11)
RDW: 17.9 % — ABNORMAL HIGH (ref 11.5–15.5)
WBC: 7.1 10*3/uL (ref 4.0–10.5)

## 2014-03-17 MED ORDER — LEVOFLOXACIN 500 MG PO TABS: 500.0000 mg | ORAL_TABLET | Freq: Every day | ORAL | Status: AC

## 2014-03-17 MED ORDER — PREDNISONE 5 MG PO TABS: ORAL_TABLET | ORAL | Status: DC

## 2014-03-17 MED ORDER — RIVAROXABAN 10 MG PO TABS: 10.0000 mg | ORAL_TABLET | Freq: Every day | ORAL | Status: DC

## 2014-03-17 MED ORDER — OXYCODONE HCL 5 MG PO TABS: 5.0000 mg | ORAL_TABLET | Freq: Three times a day (TID) | ORAL | Status: DC | PRN

## 2014-03-17 MED ORDER — CETYLPYRIDINIUM CHLORIDE 0.05 % MT LIQD: 7.0000 mL | Freq: Two times a day (BID) | OROMUCOSAL | Status: DC

## 2014-03-17 MED ORDER — ALBUTEROL SULFATE (2.5 MG/3ML) 0.083% IN NEBU: 2.5000 mg | INHALATION_SOLUTION | RESPIRATORY_TRACT | Status: AC | PRN

## 2014-03-17 NOTE — Progress Notes (Signed)
Tele discontinued Rickard Rhymes, RN

## 2014-03-17 NOTE — Discharge Summary (Signed)
Physician Discharge Summary  Nakiea Metzner MRN: [id number] DOB/AGE: January 26, 1935 79 y.o.  PCP: [name], MD   Admit date: 03/15/2014 Discharge date: 03/17/2014  Discharge Diagnoses:  Hyponatremia secondary to SIADH   Dyspnea   COPD with acute exacerbation   Acute respiratory failure Recent hip fracture History of squamous cell lung cancer Anemia of chronic disease T12 compression fracture without any back pain  Follow recommendations Recommend continuation of anticoagulation beyond 30 days for hip fracture given increased risk of DVT Followup BMP weekly Follow up with PCP in 5-7 days Followup with oncology as scheduled     Medication List    STOP taking these medications       losartan 25 MG tablet  Commonly known as:  COZAAR     sorbitol 70 % Soln      TAKE these medications       albuterol (2.5 MG/3ML) 0.083% nebulizer solution  Commonly known as:  PROVENTIL  Take 3 mLs (2.5 mg total) by nebulization every 4 (four) hours as needed for wheezing or shortness of breath.     antiseptic oral rinse 0.05 % Liqd solution  Commonly known as:  CPC / CETYLPYRIDINIUM CHLORIDE 0.05%  7 mLs by Mouth Rinse route 2 (two) times daily.     calcium-vitamin D 500-200 MG-UNIT per tablet  Commonly known as:  OSCAL WITH D  Take 1 tablet by mouth daily with breakfast.     iron polysaccharides 150 MG capsule  Commonly known as:  NIFEREX  Take 150 mg by mouth daily.     levofloxacin 500 MG tablet  Commonly known as:  LEVAQUIN  Take 1 tablet (500 mg total) by mouth daily.     NUTRITIONAL DRINK PLUS PO  Take 8 oz by mouth 3 (three) times daily. Medpass Supplement     NUTRITIONAL SUPPLEMENT PO  Take 1 each by mouth 2 (two) times daily. Magic cup nutritional supplement     oxyCODONE 5 MG immediate release tablet  Commonly known as:  Oxy IR/ROXICODONE  Take 1-2 tablets (5-10 mg total) by mouth 3 (three) times daily as needed for severe pain.     predniSONE 5 MG tablet   Commonly known as:  DELTASONE  - 8 tablets for 5 days  - 7 tablets for 5 days  - 6 tablets for 5 days  - 5 tablets for 5 days  - 4 tablets for 5 days  - 3 tablets for 5 days  - 2 tablets for 5 days  - 1 tablet for 5 days then discontinue     promethazine 25 MG tablet  Commonly known as:  PHENERGAN  Take 1 tablet (25 mg total) by mouth every 6 (six) hours as needed for nausea.     rivaroxaban 10 MG Tabs tablet  Commonly known as:  XARELTO  Take 1 tablet (10 mg total) by mouth daily.     senna 8.6 MG tablet  Commonly known as:  SENOKOT  Take 1 tablet by mouth daily.        Discharge Condition: Stable  Disposition: 03-Skilled Nursing Facility   Consults: None  Significant Diagnostic Studies: Dg Chest 1 View  03/07/2014   CLINICAL DATA:  Patient fell yesterday at home, severe COPD and smoking history, current history of lung cancer with recent palliative radiation therapy for left-sided chest pain, recent pneumonia 02/11/2014, mild cough currently, chest pain today  EXAM: CHEST - 1 VIEW  COMPARISON:  11/30/2013 radiographs and 02/05/2014 CT scan  FINDINGS: Hyperinflation is consistent with COPD. The heart size is upper normal. Vascular pattern is normal.  There is diffuse interstitial change which is likely chronic as it is similar to 11/30/2013. There is more focal rounded opacity in the medial right lower lobe measuring about 2.5 cm. There is consolidation in the left lower lobe with associated pleural effusion, small.  Bony thorax is intact.  IMPRESSION: As compared to 02/05/2014 CT scan, there is persistent right lower lobe pneumonia. Persistent left lower lobe consolidation with left effusion also concerning for pneumonia. Known hypermetabolic lung lesion on the left seen on prior PET-CT not identified radiographically.   Electronically Signed   By: Skipper Cliche M.D.   On: 03/07/2014 12:13   Dg Hip Complete Right  03/07/2014   CLINICAL DATA:  fell yesterday, states  husband tripped her while walking. Pt can't remember if she fell in the living room or bathroom. Pt c/o right hip pain mostly posterior and lateral. Pt unable to completely straighten right leg  EXAM: RIGHT HIP - COMPLETE 2+ VIEW  COMPARISON:  CT 10/30/2013 and earlier studies  FINDINGS: There is some mildly impacted mid cervical fracture of the proximal right femur. No dislocation. Old fracture deformities of superior and inferior acute left pubic rami, inferior right pubic ramus.  IMPRESSION: 1. Impacted midcervical right hip fracture.   Electronically Signed   By: Arne Cleveland M.D.   On: 03/07/2014 09:58   Ct Angio Chest Pe W/cm &/or Wo Cm  03/15/2014   CLINICAL DATA:  Progressive shortness of breath and hypoxia. Recent hip fracture. Evaluate for pulmonary embolism.  EXAM: CT ANGIOGRAPHY CHEST WITH CONTRAST  TECHNIQUE: Multidetector CT imaging of the chest was performed using the standard protocol during bolus administration of intravenous contrast. Multiplanar CT image reconstructions and MIPs were obtained to evaluate the vascular anatomy.  CONTRAST:  59mL OMNIPAQUE IOHEXOL 350 MG/ML SOLN  COMPARISON:  02/05/2014; 06/05/2013; chest radiograph - earlier same day ; nuclear medicine VQ scan -03/09/2014; PET-CT- 10/30/2013; CT guided left lower lobe pulmonary nodule biopsy- 11/27/2013.  FINDINGS: Vascular Findings:  There is adequate opacification of the pulmonary arterial system with the main pulmonary artery measuring 497 Hounsfield units. No discrete filling defects are seen within the pulmonary arterial tree to suggest pulmonary embolism. Normal caliber the main pulmonary artery.  Borderline cardiomegaly. Minimal coronary artery calcifications. No pericardial effusion.  Scattered atherosclerotic plaque within a normal caliber thoracic aorta. Conventional configuration of the aortic arch. The branch vessels of the aortic arch widely patent throughout their imaged course. No definite thoracic aortic  dissection or periaortic stranding on this nongated examination.  Review of the MIP images confirms the above findings.   ----------------------------------------------------------------------------------  Nonvascular Findings:  Since prior CT performed 02/05/2014, there has been improved aeration of the right lower lobe with persistent ill-defined heterogeneous airspace opacities but with decreased consolidation an Organization. There are minimal linear heterogeneous opacities within the right lower lobe.  There has been progressive consolidation and collapse involving the left lower lobe with development of a small left-sided pleural effusion. No definite pleural nodularity.  There is an ill-defined area of heterogeneous consolidation within the anterior medial aspect of the right upper lobe (image 50, series 6).  Severe paraseptal and centrilobular emphysematous change is re- demonstrated. There is worsening interstitial thickening most conspicuous within the bilateral upper lobes, left greater than right.  Debris is seen within in the right interlobar bronchus (image 40, series 6). There is mild diffuse bronchial wall thickening within the  right lower lobe bronchi. The remaining central pulmonary airways are widely patent.  Borderline enlarged right suprahilar lymph nodes with index nodal conglomeration measuring 1.1 cm in greatest short axis diameter (image 44, series 4). Additional scattered shotty mediastinal lymph nodes are individually not enlarged by size criteria with index precarinal lymph node measuring 0.8 cm in greatest short axis diameter. No definitive mediastinal or axillary lymphadenopathy.  Limited early arterial phase evaluation of the upper abdomen demonstrates a punctate (approximately 0.7 cm) hypo attenuating lesion within the dome of the right lobe of the liver (image 79, series 4).  Interval development of a moderate (approximately 40%) compression deformity involving the superior endplate  of the W11 vertebral body with minimal (approximately 4 mm) retropulsion of the posterior aspect of the superior endplate of the B14 vertebral body.  Regional soft tissues appear normal. Normal appearance of the thyroid gland.  IMPRESSION: 1. No evidence of pulmonary embolism. 2. Worsening consolidation and collapse of the left lower lobe, now with development of a small left-sided pleural effusion - while possibly infectious in etiology, underlying malignancy is not excluded on the basis of this examination. Correlation with prior biopsy results from the 11/27/2013 CT-guided lung biopsy is recommended. 3. Suspected mild pulmonary edema superimposed on advanced emphysematous change. 4. Borderline enlarged right suprahilar lymph node go, possibly reactive in etiology. Continued attention on follow-up is recommended. 5. Compared to prior CT performed 02/05/2014, there has been improved aeration of the right lower lobe with persistent ill-defined heterogeneous airspace opacities and debris noted within the right interlobar bronchus - findings suggestive of improved though residual infection and/or aspiration. 6. Interval development of a moderate (approximately 25%) compression deformity involving the superior endplate of the N82 vertebral body with minimal (approximately 4 mm) or retropulsion. Correlation for point tenderness at this location is recommended.   Electronically Signed   By: Sandi Mariscal M.D.   On: 03/15/2014 18:27   Dg Pelvis Portable  03/07/2014   CLINICAL DATA:  Status post right hip replacement after subcapital fracture.  EXAM: PORTABLE PELVIS 1-2 VIEWS  COMPARISON:  Plain films right hip 03/07/2014 at 9:30 a.m.  FINDINGS: Right hip hemiarthroplasty is in place. The device is located. No fracture. Gas the soft tissues and surgical staples noted. Remote pubic rami fractures are identified.  IMPRESSION: Right hip replacement without evidence of complication.   Electronically Signed   By: Inge Rise M.D.   On: 03/07/2014 18:17   Nm Pulmonary Perf And Vent  03/09/2014   CLINICAL DATA:  Non-small cell lung cancer, COPD, smoker, prior radiation therapy, shortness of breath for months, no chest pain, no history of pulmonary embolism or DVT  EXAM: NUCLEAR MEDICINE VENTILATION - PERFUSION LUNG SCAN  TECHNIQUE: Ventilation images were obtained in multiple projections using inhaled aerosol technetium 99 M DTPA. Perfusion images were obtained in multiple projections after intravenous injection of Tc-25m MAA.  RADIOPHARMACEUTICALS:  40.0 mCi Tc-41m DTPA aerosol inhalation and 6.0 mCi Tc-1m MAA IV  COMPARISON:  None; correlation chest radiograph 03/08/2014  FINDINGS: Ventilation: Markedly abnormal and impaired ventilation exam. Significant central airway deposition of tracer. Large areas of diminished ventilation involving LEFT upper lobe, LEFT lower lobe, RIGHT lower lobe and RIGHT middle lobe.  Perfusion: Perfusion pattern use much better than the ventilation pattern. Less severely impaired perfusion than ventilation within RIGHT middle lobe. Subsegmental perfusion defect LEFT lower lobe though overall LEFT lower lobe demonstrates significantly better perfusion than ventilation.  Chest radiograph demonstrates BILATERAL pleural effusions and basilar infiltrates  LEFT greater than RIGHT, underlying COPD, and LEFT mid lung infiltrate.  IMPRESSION: Much better perfusion than ventilation throughout both lungs particularly in LEFT upper lobe.  Better perfusion than ventilation in RIGHT middle lobe.  Better perfusion than ventilation in LEFT lower lobe.  Overall, findings are more suggestive of parenchymal lung disease and represent a low probability for pulmonary embolism.   Electronically Signed   By: Lavonia Dana M.D.   On: 03/09/2014 11:03   Dg Chest Port 1 View  03/15/2014   CLINICAL DATA:  Expiratory distress.  History of COPD.  EXAM: PORTABLE CHEST - 1 VIEW  COMPARISON:  Radiograph 03/08/2014  FINDINGS:  Normal cardiac silhouette. Small to moderate left pleural effusion is unchanged. Small right pleural effusion is improved. There is fine interstitial pattern within the lungs which is unchanged from prior. There is a bibasilar airspace opacities which process similar prior.  IMPRESSION: 1. Improvement in the right pleural effusion. 2. Persistent left pleural effusion and bibasilar infiltrates. 3. Underlying chronic interstitial lung disease.   Electronically Signed   By: Suzy Bouchard M.D.   On: 03/15/2014 16:45   Dg Chest Port 1 View  03/08/2014   CLINICAL DATA:  Shortness of breath, personal history hypertension, COPD, stage IB squamous cell lung cancer LEFT lower lobe post stereotactic radiotherapy, smoker,  EXAM: PORTABLE CHEST - 1 VIEW  COMPARISON:  Portable exam 1545 hr compared to 03/07/2014  FINDINGS: Upper normal heart size.  Slight pulmonary vascular congestion.  Stable mediastinal contours.  Emphysematous changes with bibasilar infiltrates slightly increased.  Small bibasilar pleural effusions larger on LEFT.  LEFT mid lung infiltrates appear slightly increased.  Scarring at RIGHT apex.  No pneumothorax.  Bones demineralized.  IMPRESSION: Bibasilar infiltrates and pleural effusions slightly increased, greater on LEFT.  Underlying emphysematous changes.   Electronically Signed   By: Lavonia Dana M.D.   On: 03/08/2014 16:30   Dg Knee Complete 4 Views Right  03/07/2014   CLINICAL DATA:  fell yesterday, states husband tripped her while walking. Pt can't remember if she fell in the living room or bathroom. Pt c/o diffuse right knee pain. Pt unable to completely straighten right leg  EXAM: RIGHT KNEE - COMPLETE 4+ VIEW  COMPARISON:  None.  FINDINGS: There is no evidence of acute fracture, dislocation, or joint effusion. Mild osteopenia. There is no evidence of arthropathy or other focal bone abnormality. Soft tissues are unremarkable. Old fracture deformity of the mid tibial and fibular shafts,  incompletely visualized.  IMPRESSION: Osteopenia without acute abnormality   Electronically Signed   By: Arne Cleveland M.D.   On: 03/07/2014 09:55      Microbiology: Recent Results (from the past 240 hour(s))  MRSA PCR SCREENING     Status: None   Collection Time    03/16/14 11:19 AM      Result Value Ref Range Status   MRSA by PCR NEGATIVE  NEGATIVE Final   Comment:            The GeneXpert MRSA Assay (FDA     approved for NASAL specimens     only), is one component of a     comprehensive MRSA colonization     surveillance program. It is not     intended to diagnose MRSA     infection nor to guide or     monitor treatment for     MRSA infections.  CULTURE, BLOOD (ROUTINE X 2)     Status: None   Collection  Time    03/16/14 11:30 AM      Result Value Ref Range Status   Specimen Description BLOOD LEFT HAND   Final   Special Requests BOTTLES DRAWN AEROBIC AND ANAEROBIC 10CC   Final   Culture  Setup Time     Final   Value: 03/16/2014 20:30     Performed at [facility]   Culture     Final   Value:        BLOOD CULTURE RECEIVED NO GROWTH TO DATE CULTURE WILL BE HELD FOR 5 DAYS BEFORE ISSUING A FINAL NEGATIVE REPORT     Performed at [facility]   Report Status PENDING   Incomplete  CULTURE, BLOOD (ROUTINE X 2)     Status: None   Collection Time    03/16/14 11:40 AM      Result Value Ref Range Status   Specimen Description BLOOD LEFT HAND   Final   Special Requests BOTTLES DRAWN AEROBIC AND ANAEROBIC 10CC   Final   Culture  Setup Time     Final   Value: 03/16/2014 20:30     Performed at [facility]   Culture     Final   Value:        BLOOD CULTURE RECEIVED NO GROWTH TO DATE CULTURE WILL BE HELD FOR 5 DAYS BEFORE ISSUING A FINAL NEGATIVE REPORT     Performed at [facility]   Report Status PENDING   Incomplete     Labs: Results for orders placed during the hospital encounter of 03/15/14 (from the past 48 hour(s))  BASIC METABOLIC PANEL      Status: Abnormal   Collection Time    03/15/14  3:37 PM      Result Value Ref Range   Sodium 129 (*) 137 - 147 mEq/L   Potassium 4.3  3.7 - 5.3 mEq/L   Chloride 87 (*) 96 - 112 mEq/L   CO2 33 (*) 19 - 32 mEq/L   Glucose, Bld 133 (*) 70 - 99 mg/dL   BUN 15  6 - 23 mg/dL   Creatinine, Ser 0.46 (*) 0.50 - 1.10 mg/dL   Calcium 8.6  8.4 - 10.5 mg/dL   GFR calc non Af Amer >90  >90 mL/min   GFR calc Af Amer >90  >90 mL/min   Comment: (NOTE)     The eGFR has been calculated using the CKD EPI equation.     This calculation has not been validated in all clinical situations.     eGFR's persistently <90 mL/min signify possible Chronic Kidney     Disease.   Anion gap 9  5 - 15  CBC WITH DIFFERENTIAL     Status: Abnormal   Collection Time    03/15/14  3:37 PM      Result Value Ref Range   WBC 7.3  4.0 - 10.5 K/uL   RBC 2.92 (*) 3.87 - 5.11 MIL/uL   Hemoglobin 8.8 (*) 12.0 - 15.0 g/dL   HCT 27.2 (*) 36.0 - 46.0 %   MCV 93.2  78.0 - 100.0 fL   MCH 30.1  26.0 - 34.0 pg   MCHC 32.4  30.0 - 36.0 g/dL   RDW 17.7 (*) 11.5 - 15.5 %   Platelets 470 (*) 150 - 400 K/uL   Neutrophils Relative % 84 (*) 43 - 77 %   Neutro Abs 6.2  1.7 - 7.7 K/uL   Lymphocytes Relative 6 (*) 12 - 46 %  Lymphs Abs 0.4 (*) 0.7 - 4.0 K/uL   Monocytes Relative 9  3 - 12 %   Monocytes Absolute 0.6  0.1 - 1.0 K/uL   Eosinophils Relative 1  0 - 5 %   Eosinophils Absolute 0.1  0.0 - 0.7 K/uL   Basophils Relative 0  0 - 1 %   Basophils Absolute 0.0  0.0 - 0.1 K/uL  TROPONIN I     Status: None   Collection Time    03/15/14  3:37 PM      Result Value Ref Range   Troponin I <0.30  <0.30 ng/mL   Comment:            Due to the release kinetics of cTnI,     a negative result within the first hours     of the onset of symptoms does not rule out     myocardial infarction with certainty.     If myocardial infarction is still suspected,     repeat the test at appropriate intervals.  PRO B NATRIURETIC PEPTIDE     Status:  Abnormal   Collection Time    03/15/14  3:41 PM      Result Value Ref Range   Pro B Natriuretic peptide (BNP) 1337.0 (*) 0 - 450 pg/mL  I-STAT ARTERIAL BLOOD GAS, ED     Status: Abnormal   Collection Time    03/15/14  4:22 PM      Result Value Ref Range   pH, Arterial 7.426  7.350 - 7.450   pCO2 arterial 54.9 (*) 35.0 - 45.0 mmHg   pO2, Arterial 138.0 (*) 80.0 - 100.0 mmHg   Bicarbonate 36.1 (*) 20.0 - 24.0 mEq/L   TCO2 38  0 - 100 mmol/L   O2 Saturation 99.0     Acid-Base Excess 10.0 (*) 0.0 - 2.0 mmol/L   Collection site RADIAL, ALLEN'S TEST ACCEPTABLE     Drawn by Operator     Sample type ARTERIAL    OSMOLALITY     Status: None   Collection Time    03/15/14  6:09 PM      Result Value Ref Range   Osmolality 280  275 - 300 mOsm/kg   Comment: Performed at [facility]  SODIUM, URINE, RANDOM     Status: None   Collection Time    03/15/14  6:11 PM      Result Value Ref Range   Sodium, Ur 61    OSMOLALITY, URINE     Status: None   Collection Time    03/15/14  6:11 PM      Result Value Ref Range   Osmolality, Ur 509  390 - 1090 mOsm/kg   Comment: Performed at [facility]  TROPONIN I     Status: None   Collection Time    03/15/14  7:24 PM      Result Value Ref Range   Troponin I <0.30  <0.30 ng/mL   Comment:            Due to the release kinetics of cTnI,     a negative result within the first hours     of the onset of symptoms does not rule out     myocardial infarction with certainty.     If myocardial infarction is still suspected,     repeat the test at appropriate intervals.  TROPONIN I     Status: None   Collection Time    03/16/14  1:11 AM      Result Value Ref Range   Troponin I <0.30  <0.30 ng/mL   Comment:            Due to the release kinetics of cTnI,     a negative result within the first hours     of the onset of symptoms does not rule out     myocardial infarction with certainty.     If myocardial infarction is still suspected,      repeat the test at appropriate intervals.  COMPREHENSIVE METABOLIC PANEL     Status: Abnormal   Collection Time    03/16/14  1:11 AM      Result Value Ref Range   Sodium 133 (*) 137 - 147 mEq/L   Potassium 4.5  3.7 - 5.3 mEq/L   Chloride 94 (*) 96 - 112 mEq/L   Comment: DELTA CHECK NOTED   CO2 29  19 - 32 mEq/L   Glucose, Bld 191 (*) 70 - 99 mg/dL   BUN 12  6 - 23 mg/dL   Creatinine, Ser 0.31 (*) 0.50 - 1.10 mg/dL   Calcium 8.4  8.4 - 10.5 mg/dL   Total Protein 5.8 (*) 6.0 - 8.3 g/dL   Albumin 1.8 (*) 3.5 - 5.2 g/dL   AST 19  0 - 37 U/L   ALT 14  0 - 35 U/L   Alkaline Phosphatase 73  39 - 117 U/L   Total Bilirubin 0.3  0.3 - 1.2 mg/dL   GFR calc non Af Amer >90  >90 mL/min   GFR calc Af Amer >90  >90 mL/min   Comment: (NOTE)     The eGFR has been calculated using the CKD EPI equation.     This calculation has not been validated in all clinical situations.     eGFR's persistently <90 mL/min signify possible Chronic Kidney     Disease.   Anion gap 10  5 - 15  CBC     Status: Abnormal   Collection Time    03/16/14  1:11 AM      Result Value Ref Range   WBC 4.2  4.0 - 10.5 K/uL   RBC 2.54 (*) 3.87 - 5.11 MIL/uL   Hemoglobin 7.5 (*) 12.0 - 15.0 g/dL   HCT 23.4 (*) 36.0 - 46.0 %   MCV 92.1  78.0 - 100.0 fL   MCH 29.5  26.0 - 34.0 pg   MCHC 32.1  30.0 - 36.0 g/dL   RDW 17.8 (*) 11.5 - 15.5 %   Platelets 367  150 - 400 K/uL  TROPONIN I     Status: None   Collection Time    03/16/14  7:24 AM      Result Value Ref Range   Troponin I <0.30  <0.30 ng/mL   Comment:            Due to the release kinetics of cTnI,     a negative result within the first hours     of the onset of symptoms does not rule out     myocardial infarction with certainty.     If myocardial infarction is still suspected,     repeat the test at appropriate intervals.  MRSA PCR SCREENING     Status: None   Collection Time    03/16/14 11:19 AM      Result Value Ref Range   MRSA by PCR NEGATIVE  NEGATIVE    Comment:  The GeneXpert MRSA Assay (FDA     approved for NASAL specimens     only), is one component of a     comprehensive MRSA colonization     surveillance program. It is not     intended to diagnose MRSA     infection nor to guide or     monitor treatment for     MRSA infections.  CULTURE, BLOOD (ROUTINE X 2)     Status: None   Collection Time    03/16/14 11:30 AM      Result Value Ref Range   Specimen Description BLOOD LEFT HAND     Special Requests BOTTLES DRAWN AEROBIC AND ANAEROBIC 10CC     Culture  Setup Time       Value: 03/16/2014 20:30     Performed at [facility]   Culture       Value:        BLOOD CULTURE RECEIVED NO GROWTH TO DATE CULTURE WILL BE HELD FOR 5 DAYS BEFORE ISSUING A FINAL NEGATIVE REPORT     Performed at [facility]   Report Status PENDING    CULTURE, BLOOD (ROUTINE X 2)     Status: None   Collection Time    03/16/14 11:40 AM      Result Value Ref Range   Specimen Description BLOOD LEFT HAND     Special Requests BOTTLES DRAWN AEROBIC AND ANAEROBIC 10CC     Culture  Setup Time       Value: 03/16/2014 20:30     Performed at [facility]   Culture       Value:        BLOOD CULTURE RECEIVED NO GROWTH TO DATE CULTURE WILL BE HELD FOR 5 DAYS BEFORE ISSUING A FINAL NEGATIVE REPORT     Performed at [facility]   Report Status PENDING    COMPREHENSIVE METABOLIC PANEL     Status: Abnormal   Collection Time    03/17/14  4:26 AM      Result Value Ref Range   Sodium 139  137 - 147 mEq/L   Potassium 4.7  3.7 - 5.3 mEq/L   Chloride 99  96 - 112 mEq/L   CO2 32  19 - 32 mEq/L   Glucose, Bld 154 (*) 70 - 99 mg/dL   BUN 13  6 - 23 mg/dL   Creatinine, Ser 0.39 (*) 0.50 - 1.10 mg/dL   Calcium 8.7  8.4 - 10.5 mg/dL   Total Protein 5.6 (*) 6.0 - 8.3 g/dL   Albumin 1.9 (*) 3.5 - 5.2 g/dL   AST 40 (*) 0 - 37 U/L   ALT 32  0 - 35 U/L   Alkaline Phosphatase 76  39 - 117 U/L   Total Bilirubin 0.2 (*) 0.3 - 1.2 mg/dL    GFR calc non Af Amer >90  >90 mL/min   GFR calc Af Amer >90  >90 mL/min   Comment: (NOTE)     The eGFR has been calculated using the CKD EPI equation.     This calculation has not been validated in all clinical situations.     eGFR's persistently <90 mL/min signify possible Chronic Kidney     Disease.   Anion gap 8  5 - 15  CBC     Status: Abnormal   Collection Time    03/17/14  4:26 AM      Result Value Ref Range   WBC 7.1  4.0 - 10.5 K/uL   RBC 2.48 (*) 3.87 - 5.11 MIL/uL   Hemoglobin 7.4 (*) 12.0 - 15.0 g/dL   HCT 23.0 (*) 36.0 - 46.0 %   MCV 92.7  78.0 - 100.0 fL   MCH 29.8  26.0 - 34.0 pg   MCHC 32.2  30.0 - 36.0 g/dL   RDW 17.9 (*) 11.5 - 15.5 %   Platelets 535 (*) 150 - 400 K/uL     HPI :79 year old female history of lung cancer, COPD on 2 L home oxygen, high blood pressure, chronic smoker, recent hip fracture earlier this month and repair currently in [place]. Pt in facility for rehab of right hip fracture. Nursing staff noticed increased respiratory effort. Pt only complain is of hip pain. Pt has extensive respiratory history (lucg cancer, COPD, HTN), and is noted to have productive cough with yellow sputum.  with worsening shortness of breath and hypoxia now requiring 4 L nasal cannula on arrival.Uses oxygen at baseline 2 L at home and has used this for a while. Patient's had gradually worsening shortness of breath the past week. Patient unsure if this is similar to COPD she has a chronic cough and chronic shortness of breath but this is definitely more significant. No blood clot history.Patient on Xarelto at least 4 weeks post op, starting 03/09/14.  Patient has had a rash to IV contrast dye in the past. Prepped with Benadryl and steroids for CTA To r/o pulmonary embolism. Patient being admitted for hypoxia/ dyspnea/ tachycardia, low sodium.      HOSPITAL COURSE: Acute Copd exacerbation  PNA vs mild pulmonary edema , no pulmonary embolism by CT scan on 10/12,  results as above Initiated on iv steroids, nebs, broad spectrum abx namely cefepime, vancomycin for possible pneumonia  As which oral prednisone taper and levofloxacin for another 5 days No PE , left lower lobe collapse given history of lung cancer , patient to followup with oncology for this Has a repeat CT scan ordered for 11/10 Patient feels that she is back to baseline Discharge to nursing home today Blood culture from 10/13 shows no growth so far  HTN (hypertension)-hold losartan 25 daily given low BP , this may be restarted if deemed appropriate  Recent hip fracture , stable  Continue xarelto for DVT prophylaxsis  Continue xarelto beyond 30 days if okay with oncology, given high risk of PE/DVT in the setting of lung cancer   Active squamous cell Lung cancer, lower lobe-needs outpatient management. Unclear if fully palliative care at this point , status post radiation treatments up until September 2015, completed therapy on 02/11/14, was scheduled for a repeat CT scan on 10/10, has a followup with Dr. Tammi Klippel on 10/15    Hyponatremia -improving  Likely SIADH, secondary to underlying lung cancer, pneumonia  Studies consistent with SIADH Fluid restriction -1200 cc monitor Serial BMP weekly  Anemia of chronic disease  Monitor CBC , transfuse for hemoglobin less than 7.0      Discharge Exam: Blood pressure 121/47, pulse 95, temperature 97.3 F (36.3 C), temperature source Oral, resp. rate 20, height _0  (1.575 m), weight 49 kg (108 lb 0.4 oz), SpO2 98.00%.  General: alert & oriented x 3 In NAD  Cardiovascular: RRR, nl S1 s2  Respiratory: Decreased breath sounds at the bases, scattered rhonchi, no crackles  Abdomen: soft +BS NT/ND, no masses palpable  Extremities: No cyanosis and no edema        Discharge Instructions   Diet -  low sodium heart healthy    Complete by:  As directed      Increase activity slowly    Complete by:  As directed            Follow-up  Information   Follow up with [name], MD. Schedule an appointment as soon as possible for a visit in 1 week.   Specialty:  Internal Medicine   Contact information:   [address] [place] [address] [phone]       Signed: Reyne Dumas 03/17/2014, 11:03 AM

## 2014-03-17 NOTE — Clinical Social Work Note (Signed)
Patient for d/c today to SNF bed at [facility] as pta. Family and patient agreeable to this plan- plan transfer by daughter @ [place], MSW, [place]

## 2014-03-17 NOTE — Progress Notes (Signed)
Report called to [facility] at [facility], Karren Burly, RN

## 2014-03-17 NOTE — Clinical Social Work Psychosocial (Signed)
Clinical Social Work Department BRIEF PSYCHOSOCIAL ASSESSMENT 03/17/2014  Patient:  [name], [name]     Account Number:  [id number]     Admit date:  03/15/2014  Clinical Social Worker:  Daiva Huge  Date/Time:  03/17/2014 10:50 AM  Referred by:  Physician  Date Referred:  03/17/2014 Referred for  SNF Placement   Other Referral:   Interview type:  Patient Other interview type:   AND DAUGHTER    PSYCHOSOCIAL DATA Living Status:  FACILITY Admitted from facility:  [facility] [place] Level of care:  [place] Primary support name:  ANGEL Primary support relationship to patient:  FAMILY Degree of support available:   GOOD    CURRENT CONCERNS Current Concerns  Post-Acute Placement   Other Concerns:    SOCIAL WORK ASSESSMENT / PLAN Patient reports she has been staying at [facility] for rehab after her hip fx- she is planning to return today and I have confirmed this with SNF as well-   Assessment/plan status:  Other - See comment Other assessment/ plan:   update FL2   Information/referral to community resources:    PATIENT'S/FAMILY'S RESPONSE TO PLAN OF CARE: Patient and family are in agreement with plans for dc back to SNF bed at [facility] today- patient and daughter are planning to transport via car- will await final dc orders and summary for d/c.       Eduard Clos, MSW, Latanya Presser [phone]

## 2014-03-18 ENCOUNTER — Ambulatory Visit: Payer: Self-pay | Admitting: Radiation Oncology

## 2014-03-22 ENCOUNTER — Telehealth: Payer: Self-pay | Admitting: Internal Medicine

## 2014-03-22 LAB — CULTURE, BLOOD (ROUTINE X 2)
Culture: NO GROWTH
Culture: NO GROWTH

## 2014-03-22 NOTE — Telephone Encounter (Signed)
lvm for pt regarding to OCT and NOV appt....advised on all appts

## 2014-03-25 ENCOUNTER — Ambulatory Visit: Payer: Medicare Other | Attending: Radiation Oncology | Admitting: Radiation Oncology

## 2014-04-01 ENCOUNTER — Ambulatory Visit
Admission: RE | Admit: 2014-04-01 | Discharge: 2014-04-01 | Disposition: A | Discharge status: Home / Self Care | Payer: Medicare Other | Source: Ambulatory Visit | Attending: Radiation Oncology | Admitting: Radiation Oncology

## 2014-04-01 ENCOUNTER — Encounter: Payer: Self-pay | Admitting: Radiation Oncology

## 2014-04-01 VITALS — BP 97/44 | HR 109 | Temp 98.9°F | Resp 20 | Wt 96.0 lb

## 2014-04-01 DIAGNOSIS — C3492 Malignant neoplasm of unspecified part of left bronchus or lung: Secondary | ICD-10-CM

## 2014-04-01 NOTE — Progress Notes (Signed)
Radiation Oncology         [phone]) [phone] ________________________________  Name: [name]  MRN: [id number]  Date: 04/01/2014  DOB: 01-01-1935     Follow-Up Visit Note  CC: Cynthia Gravel, MD  Cynthia Bears, MD  Diagnosis:   79 yo woman with local recurrence of left lower lung cancer s/p previous SBRT followed by salvage conventional conformal radiotherapy 12/28/2013-02/11/2014  to 66 Gy in 33 fractions of 2 Gy    ICD-9-CM ICD-10-CM  1. Local recurrence of left lung cancer 162.9 C34.92   Interval Since Last Radiation:  6  weeks  Narrative:  The patient returns today for routine follow-up.    Fell approx 4 weeks ago and you broke her right hip.  She had surgery and is 3 weeks post op.  She is still getting Lovenox injections.  She reports her right foot is bothering her.  Noted swelling, warmth and pink tone to bilateral lower extremities. She is currently in no pain.  Pt complains of, Chills, Loss of Sleep, Fatigue and Generalized Weakness, loss of appetite. Has lost approx 12 lbs since Oct 13th 2015.  Wheezing and Shortness of Breath  Walking and Stairs. Pt is on 4 liters/min via Patient connected to nasal cannula oxygen. Oral exam reveals a dry mouth with a moderate amount of yellow exudate. Pt denies dysphagia. Pt reports she has periods of urinary and bowel incontinence, especially at night.  She reports her family gets upset with her because they think she is doing this on purpose.  Informed her (and her daughter) how the aging process affects the bowel and bladder.                               ALLERGIES:  is allergic to iohexol.  Meds: Current Outpatient Prescriptions  Medication Sig Dispense Refill  . albuterol (PROVENTIL) (2.5 MG/3ML) 0.083% nebulizer solution Take 3 mLs (2.5 mg total) by nebulization every 4 (four) hours as needed for wheezing or shortness of breath.  75 mL  12  . antiseptic oral rinse (CPC / CETYLPYRIDINIUM CHLORIDE 0.05%) 0.05 % LIQD solution 7 mLs by  Mouth Rinse route 2 (two) times daily.  710 mL  0  . aspirin 325 MG tablet Take 325 mg by mouth daily.      . calcium-vitamin D (OSCAL WITH D) 500-200 MG-UNIT per tablet Take 1 tablet by mouth daily with breakfast.  42 tablet  0  . enoxaparin (LOVENOX) 40 MG/0.4ML injection Inject 40 mg into the skin daily.      . iron polysaccharides (NIFEREX) 150 MG capsule Take 150 mg by mouth daily.      . Nutritional Supplements (NUTRITIONAL DRINK PLUS PO) Take 8 oz by mouth 3 (three) times daily. Medpass Supplement      . Nutritional Supplements (NUTRITIONAL SUPPLEMENT PO) Take 1 each by mouth 2 (two) times daily. Magic cup nutritional supplement      . oxyCODONE (OXY IR/ROXICODONE) 5 MG immediate release tablet Take 1-2 tablets (5-10 mg total) by mouth 3 (three) times daily as needed for severe pain.  30 tablet  0  . predniSONE (DELTASONE) 5 MG tablet 8 tablets for 5 days 7 tablets for 5 days 6 tablets for 5 days 5 tablets for 5 days 4 tablets for 5 days 3 tablets for 5 days 2 tablets for 5 days 1 tablet for 5 days then discontinue  300 tablet  0  . promethazine (PHENERGAN)  25 MG tablet Take 1 tablet (25 mg total) by mouth every 6 (six) hours as needed for nausea.  30 tablet  1  . senna (SENOKOT) 8.6 MG tablet Take 1 tablet by mouth daily.      . rivaroxaban (XARELTO) 10 MG TABS tablet Take 1 tablet (10 mg total) by mouth daily.  60 tablet  2   No current facility-administered medications for this encounter.    Physical Findings: The patient is in no acute distress. Patient is alert and oriented.  weight is 96 lb (43.545 kg). Her oral temperature is 98.9 F (37.2 C). Her blood pressure is 97/44 and her pulse is 109. Her respiration is 20 and oxygen saturation is 100%. .  No significant changes.  Lab Findings: Lab Results  Component Value Date   WBC 7.1 03/17/2014   HGB 7.4* 03/17/2014   HCT 23.0* 03/17/2014   MCV 92.7 03/17/2014   PLT 535* 03/17/2014    @LASTCHEM @  Radiographic  Findings: Dg Chest 1 View  03/07/2014   CLINICAL DATA:  Patient fell yesterday at home, severe COPD and smoking history, current history of lung cancer with recent palliative radiation therapy for left-sided chest pain, recent pneumonia 02/11/2014, mild cough currently, chest pain today  EXAM: CHEST - 1 VIEW  COMPARISON:  11/30/2013 radiographs and 02/05/2014 CT scan  FINDINGS: Hyperinflation is consistent with COPD. The heart size is upper normal. Vascular pattern is normal.  There is diffuse interstitial change which is likely chronic as it is similar to 11/30/2013. There is more focal rounded opacity in the medial right lower lobe measuring about 2.5 cm. There is consolidation in the left lower lobe with associated pleural effusion, small.  Bony thorax is intact.  IMPRESSION: As compared to 02/05/2014 CT scan, there is persistent right lower lobe pneumonia. Persistent left lower lobe consolidation with left effusion also concerning for pneumonia. Known hypermetabolic lung lesion on the left seen on prior PET-CT not identified radiographically.   Electronically Signed   By: Skipper Cliche M.D.   On: 03/07/2014 12:13   Dg Hip Complete Right  03/07/2014   CLINICAL DATA:  fell yesterday, states husband tripped her while walking. Pt can't remember if she fell in the living room or bathroom. Pt c/o right hip pain mostly posterior and lateral. Pt unable to completely straighten right leg  EXAM: RIGHT HIP - COMPLETE 2+ VIEW  COMPARISON:  CT 10/30/2013 and earlier studies  FINDINGS: There is some mildly impacted mid cervical fracture of the proximal right femur. No dislocation. Old fracture deformities of superior and inferior acute left pubic rami, inferior right pubic ramus.  IMPRESSION: 1. Impacted midcervical right hip fracture.   Electronically Signed   By: Arne Cleveland M.D.   On: 03/07/2014 09:58   Ct Angio Chest Pe W/cm &/or Wo Cm  03/15/2014   CLINICAL DATA:  Progressive shortness of breath and  hypoxia. Recent hip fracture. Evaluate for pulmonary embolism.  EXAM: CT ANGIOGRAPHY CHEST WITH CONTRAST  TECHNIQUE: Multidetector CT imaging of the chest was performed using the standard protocol during bolus administration of intravenous contrast. Multiplanar CT image reconstructions and MIPs were obtained to evaluate the vascular anatomy.  CONTRAST:  11mL OMNIPAQUE IOHEXOL 350 MG/ML SOLN  COMPARISON:  02/05/2014; 06/05/2013; chest radiograph - earlier same day ; nuclear medicine VQ scan -03/09/2014; PET-CT- 10/30/2013; CT guided left lower lobe pulmonary nodule biopsy- 11/27/2013.  FINDINGS: Vascular Findings:  There is adequate opacification of the pulmonary arterial system with the main pulmonary  artery measuring 497 Hounsfield units. No discrete filling defects are seen within the pulmonary arterial tree to suggest pulmonary embolism. Normal caliber the main pulmonary artery.  Borderline cardiomegaly. Minimal coronary artery calcifications. No pericardial effusion.  Scattered atherosclerotic plaque within a normal caliber thoracic aorta. Conventional configuration of the aortic arch. The branch vessels of the aortic arch widely patent throughout their imaged course. No definite thoracic aortic dissection or periaortic stranding on this nongated examination.  Review of the MIP images confirms the above findings.   ----------------------------------------------------------------------------------  Nonvascular Findings:  Since prior CT performed 02/05/2014, there has been improved aeration of the right lower lobe with persistent ill-defined heterogeneous airspace opacities but with decreased consolidation an Organization. There are minimal linear heterogeneous opacities within the right lower lobe.  There has been progressive consolidation and collapse involving the left lower lobe with development of a small left-sided pleural effusion. No definite pleural nodularity.  There is an ill-defined area of  heterogeneous consolidation within the anterior medial aspect of the right upper lobe (image 50, series 6).  Severe paraseptal and centrilobular emphysematous change is re- demonstrated. There is worsening interstitial thickening most conspicuous within the bilateral upper lobes, left greater than right.  Debris is seen within in the right interlobar bronchus (image 40, series 6). There is mild diffuse bronchial wall thickening within the right lower lobe bronchi. The remaining central pulmonary airways are widely patent.  Borderline enlarged right suprahilar lymph nodes with index nodal conglomeration measuring 1.1 cm in greatest short axis diameter (image 44, series 4). Additional scattered shotty mediastinal lymph nodes are individually not enlarged by size criteria with index precarinal lymph node measuring 0.8 cm in greatest short axis diameter. No definitive mediastinal or axillary lymphadenopathy.  Limited early arterial phase evaluation of the upper abdomen demonstrates a punctate (approximately 0.7 cm) hypo attenuating lesion within the dome of the right lobe of the liver (image 79, series 4).  Interval development of a moderate (approximately 40%) compression deformity involving the superior endplate of the J50 vertebral body with minimal (approximately 4 mm) retropulsion of the posterior aspect of the superior endplate of the K93 vertebral body.  Regional soft tissues appear normal. Normal appearance of the thyroid gland.  IMPRESSION: 1. No evidence of pulmonary embolism. 2. Worsening consolidation and collapse of the left lower lobe, now with development of a small left-sided pleural effusion - while possibly infectious in etiology, underlying malignancy is not excluded on the basis of this examination. Correlation with prior biopsy results from the 11/27/2013 CT-guided lung biopsy is recommended. 3. Suspected mild pulmonary edema superimposed on advanced emphysematous change. 4. Borderline enlarged  right suprahilar lymph node go, possibly reactive in etiology. Continued attention on follow-up is recommended. 5. Compared to prior CT performed 02/05/2014, there has been improved aeration of the right lower lobe with persistent ill-defined heterogeneous airspace opacities and debris noted within the right interlobar bronchus - findings suggestive of improved though residual infection and/or aspiration. 6. Interval development of a moderate (approximately 25%) compression deformity involving the superior endplate of the G18 vertebral body with minimal (approximately 4 mm) or retropulsion. Correlation for point tenderness at this location is recommended.   Electronically Signed   By: Sandi Mariscal M.D.   On: 03/15/2014 18:27   Dg Pelvis Portable  03/07/2014   CLINICAL DATA:  Status post right hip replacement after subcapital fracture.  EXAM: PORTABLE PELVIS 1-2 VIEWS  COMPARISON:  Plain films right hip 03/07/2014 at 9:30 a.m.  FINDINGS: Right hip hemiarthroplasty  is in place. The device is located. No fracture. Gas the soft tissues and surgical staples noted. Remote pubic rami fractures are identified.  IMPRESSION: Right hip replacement without evidence of complication.   Electronically Signed   By: Inge Rise M.D.   On: 03/07/2014 18:17   Nm Pulmonary Perf And Vent  03/09/2014   CLINICAL DATA:  Non-small cell lung cancer, COPD, smoker, prior radiation therapy, shortness of breath for months, no chest pain, no history of pulmonary embolism or DVT  EXAM: NUCLEAR MEDICINE VENTILATION - PERFUSION LUNG SCAN  TECHNIQUE: Ventilation images were obtained in multiple projections using inhaled aerosol technetium 99 M DTPA. Perfusion images were obtained in multiple projections after intravenous injection of Tc-78m MAA.  RADIOPHARMACEUTICALS:  40.0 mCi Tc-13m DTPA aerosol inhalation and 6.0 mCi Tc-40m MAA IV  COMPARISON:  None; correlation chest radiograph 03/08/2014  FINDINGS: Ventilation: Markedly abnormal and  impaired ventilation exam. Significant central airway deposition of tracer. Large areas of diminished ventilation involving LEFT upper lobe, LEFT lower lobe, RIGHT lower lobe and RIGHT middle lobe.  Perfusion: Perfusion pattern use much better than the ventilation pattern. Less severely impaired perfusion than ventilation within RIGHT middle lobe. Subsegmental perfusion defect LEFT lower lobe though overall LEFT lower lobe demonstrates significantly better perfusion than ventilation.  Chest radiograph demonstrates BILATERAL pleural effusions and basilar infiltrates LEFT greater than RIGHT, underlying COPD, and LEFT mid lung infiltrate.  IMPRESSION: Much better perfusion than ventilation throughout both lungs particularly in LEFT upper lobe.  Better perfusion than ventilation in RIGHT middle lobe.  Better perfusion than ventilation in LEFT lower lobe.  Overall, findings are more suggestive of parenchymal lung disease and represent a low probability for pulmonary embolism.   Electronically Signed   By: Lavonia Dana M.D.   On: 03/09/2014 11:03   Dg Chest Port 1 View  03/15/2014   CLINICAL DATA:  Expiratory distress.  History of COPD.  EXAM: PORTABLE CHEST - 1 VIEW  COMPARISON:  Radiograph 03/08/2014  FINDINGS: Normal cardiac silhouette. Small to moderate left pleural effusion is unchanged. Small right pleural effusion is improved. There is fine interstitial pattern within the lungs which is unchanged from prior. There is a bibasilar airspace opacities which process similar prior.  IMPRESSION: 1. Improvement in the right pleural effusion. 2. Persistent left pleural effusion and bibasilar infiltrates. 3. Underlying chronic interstitial lung disease.   Electronically Signed   By: Suzy Bouchard M.D.   On: 03/15/2014 16:45   Dg Chest Port 1 View  03/08/2014   CLINICAL DATA:  Shortness of breath, personal history hypertension, COPD, stage IB squamous cell lung cancer LEFT lower lobe post stereotactic radiotherapy,  smoker,  EXAM: PORTABLE CHEST - 1 VIEW  COMPARISON:  Portable exam 1545 hr compared to 03/07/2014  FINDINGS: Upper normal heart size.  Slight pulmonary vascular congestion.  Stable mediastinal contours.  Emphysematous changes with bibasilar infiltrates slightly increased.  Small bibasilar pleural effusions larger on LEFT.  LEFT mid lung infiltrates appear slightly increased.  Scarring at RIGHT apex.  No pneumothorax.  Bones demineralized.  IMPRESSION: Bibasilar infiltrates and pleural effusions slightly increased, greater on LEFT.  Underlying emphysematous changes.   Electronically Signed   By: Lavonia Dana M.D.   On: 03/08/2014 16:30   Dg Knee Complete 4 Views Right  03/07/2014   CLINICAL DATA:  fell yesterday, states husband tripped her while walking. Pt can't remember if she fell in the living room or bathroom. Pt c/o diffuse right knee pain. Pt unable to completely  straighten right leg  EXAM: RIGHT KNEE - COMPLETE 4+ VIEW  COMPARISON:  None.  FINDINGS: There is no evidence of acute fracture, dislocation, or joint effusion. Mild osteopenia. There is no evidence of arthropathy or other focal bone abnormality. Soft tissues are unremarkable. Old fracture deformity of the mid tibial and fibular shafts, incompletely visualized.  IMPRESSION: Osteopenia without acute abnormality   Electronically Signed   By: Arne Cleveland M.D.   On: 03/07/2014 09:55    Impression:  The patient is recovering from the effects of radiation.  Currently, she is debilitated by her recent fall as well as some consolidation of the left lower lung and recent right lung pneumonia. Her CAT scan performed on October 12 shows significant consolidation of the left lower lung with some surrounding effusion. This makes it difficult to assess treatment response of her tumor. At this point, it is not clear whether she has persistent disease, progressive disease, or perhaps controlled disease.  Plan:  She has follow-up chest CT in the next few  weeks and will see medical oncology thereafter. I will also asked that she return to the radiation clinic in approximately 1 month to review the CT and also meet with our palliative care nurse practitioner, given the increasing complexity of her medical care.  _____________________________________  Sheral Apley. Tammi Klippel, M.D.

## 2014-04-01 NOTE — Progress Notes (Addendum)
She is currently in no pain. Pt complains of, Chills, Loss of Sleep, Fatigue and Generalized Weakness, loss of appetite. Has lost approx 12 lbs since Oct 13th 2015.  Wheezing and Shortness of Breath  Walking and Stairs. Pt is on 4 liters/min via Patient connected to nasal cannula oxygen. Oral exam reveals a dry mouth with a moderate amount of yellow exudate. Pt denies dysphagia. Pt reports she has periods of urinary and bowel incontinence, especially at night.  She reports her family gets upset with her because they think she is doing this on purpose.  Informed her (and her daughter) how the aging process affects the bowel and bladder.  Fell approx 4 weeks ago and you broke her right hip.  She had surgery and is 3 weeks post op.  She is still getting Lovenox injections.  She reports her right foot is bothering her.  Noted swelling, warmth and pink tone to bilateral lower extremities.

## 2014-04-01 NOTE — Progress Notes (Signed)
[facility]cret[facility] of [facility] notified to increase oxygen to 4 liters via nasal cannula.Orders placed in epic.Pura Spice will fax to [place].

## 2014-04-13 ENCOUNTER — Other Ambulatory Visit: Payer: Self-pay | Admitting: Radiation Oncology

## 2014-04-13 ENCOUNTER — Other Ambulatory Visit: Payer: Self-pay | Admitting: Internal Medicine

## 2014-04-13 ENCOUNTER — Other Ambulatory Visit: Payer: Medicare Other

## 2014-04-13 ENCOUNTER — Encounter (HOSPITAL_COMMUNITY): Payer: Self-pay

## 2014-04-13 ENCOUNTER — Ambulatory Visit (HOSPITAL_COMMUNITY)
Admission: RE | Admit: 2014-04-13 | Discharge: 2014-04-13 | Disposition: A | Discharge status: Home / Self Care | Payer: Medicare Other | Source: Ambulatory Visit | Attending: Internal Medicine | Admitting: Internal Medicine

## 2014-04-13 ENCOUNTER — Other Ambulatory Visit (HOSPITAL_BASED_OUTPATIENT_CLINIC_OR_DEPARTMENT_OTHER): Payer: Medicare Other

## 2014-04-13 DIAGNOSIS — J432 Centrilobular emphysema: Secondary | ICD-10-CM | POA: Diagnosis not present

## 2014-04-13 DIAGNOSIS — C3430 Malignant neoplasm of lower lobe, unspecified bronchus or lung: Secondary | ICD-10-CM

## 2014-04-13 DIAGNOSIS — R918 Other nonspecific abnormal finding of lung field: Secondary | ICD-10-CM | POA: Diagnosis not present

## 2014-04-13 DIAGNOSIS — J90 Pleural effusion, not elsewhere classified: Secondary | ICD-10-CM | POA: Diagnosis not present

## 2014-04-13 DIAGNOSIS — J984 Other disorders of lung: Secondary | ICD-10-CM | POA: Insufficient documentation

## 2014-04-13 DIAGNOSIS — Z923 Personal history of irradiation: Secondary | ICD-10-CM | POA: Diagnosis not present

## 2014-04-13 DIAGNOSIS — I2510 Atherosclerotic heart disease of native coronary artery without angina pectoris: Secondary | ICD-10-CM | POA: Diagnosis not present

## 2014-04-13 DIAGNOSIS — Z9221 Personal history of antineoplastic chemotherapy: Secondary | ICD-10-CM | POA: Insufficient documentation

## 2014-04-13 DIAGNOSIS — C3492 Malignant neoplasm of unspecified part of left bronchus or lung: Secondary | ICD-10-CM | POA: Insufficient documentation

## 2014-04-13 DIAGNOSIS — C3432 Malignant neoplasm of lower lobe, left bronchus or lung: Secondary | ICD-10-CM

## 2014-04-13 LAB — COMPREHENSIVE METABOLIC PANEL (CC13)
ALK PHOS: 107 U/L (ref 40–150)
ALT: 13 U/L (ref 0–55)
ANION GAP: 7 meq/L (ref 3–11)
AST: 17 U/L (ref 5–34)
Albumin: 2.8 g/dL — ABNORMAL LOW (ref 3.5–5.0)
BILIRUBIN TOTAL: 0.34 mg/dL (ref 0.20–1.20)
BUN: 11.8 mg/dL (ref 7.0–26.0)
CO2: 33 meq/L — AB (ref 22–29)
Calcium: 9.3 mg/dL (ref 8.4–10.4)
Chloride: 97 mEq/L — ABNORMAL LOW (ref 98–109)
Creatinine: 0.5 mg/dL — ABNORMAL LOW (ref 0.6–1.1)
Glucose: 103 mg/dl (ref 70–140)
Potassium: 4.7 mEq/L (ref 3.5–5.1)
Sodium: 138 mEq/L (ref 136–145)
Total Protein: 6.3 g/dL — ABNORMAL LOW (ref 6.4–8.3)

## 2014-04-13 LAB — CBC WITH DIFFERENTIAL/PLATELET
BASO%: 0.5 % (ref 0.0–2.0)
Basophils Absolute: 0 10*3/uL (ref 0.0–0.1)
EOS ABS: 0.1 10*3/uL (ref 0.0–0.5)
EOS%: 1.3 % (ref 0.0–7.0)
HCT: 31.9 % — ABNORMAL LOW (ref 34.8–46.6)
HEMOGLOBIN: 10 g/dL — AB (ref 11.6–15.9)
LYMPH%: 13.7 % — AB (ref 14.0–49.7)
MCH: 30.2 pg (ref 25.1–34.0)
MCHC: 31.4 g/dL — ABNORMAL LOW (ref 31.5–36.0)
MCV: 96.2 fL (ref 79.5–101.0)
MONO#: 0.6 10*3/uL (ref 0.1–0.9)
MONO%: 8.8 % (ref 0.0–14.0)
NEUT%: 75.7 % (ref 38.4–76.8)
NEUTROS ABS: 4.8 10*3/uL (ref 1.5–6.5)
PLATELETS: 395 10*3/uL (ref 145–400)
RBC: 3.32 10*6/uL — ABNORMAL LOW (ref 3.70–5.45)
RDW: 21.6 % — ABNORMAL HIGH (ref 11.2–14.5)
WBC: 6.3 10*3/uL (ref 3.9–10.3)
lymph#: 0.9 10*3/uL (ref 0.9–3.3)

## 2014-04-13 MED ORDER — IOHEXOL 300 MG/ML  SOLN: 80.0000 mL | Freq: Once | INTRAMUSCULAR | Status: AC | PRN

## 2014-04-15 ENCOUNTER — Telehealth: Payer: Self-pay | Admitting: Radiation Oncology

## 2014-04-15 ENCOUNTER — Other Ambulatory Visit: Payer: Self-pay | Admitting: Radiation Oncology

## 2014-04-15 DIAGNOSIS — C3492 Malignant neoplasm of unspecified part of left bronchus or lung: Secondary | ICD-10-CM

## 2014-04-15 MED ORDER — HYDROCODONE-ACETAMINOPHEN 5-325 MG PO TABS: 1.0000 | ORAL_TABLET | Freq: Four times a day (QID) | ORAL | Status: DC | PRN

## 2014-04-15 NOTE — Telephone Encounter (Signed)
Phoned patient's daughter, [name], informing her that her mother's pain script is ready for pick up. She understands she will need her driver's license to pick script up in rad onc nursing station.

## 2014-04-20 ENCOUNTER — Ambulatory Visit (HOSPITAL_BASED_OUTPATIENT_CLINIC_OR_DEPARTMENT_OTHER): Payer: Medicare Other | Admitting: Internal Medicine

## 2014-04-20 ENCOUNTER — Ambulatory Visit
Admission: RE | Admit: 2014-04-20 | Discharge: 2014-04-20 | Disposition: A | Discharge status: Home / Self Care | Payer: Medicare Other | Source: Ambulatory Visit | Attending: Internal Medicine | Admitting: Internal Medicine

## 2014-04-20 ENCOUNTER — Telehealth: Payer: Self-pay | Admitting: Internal Medicine

## 2014-04-20 ENCOUNTER — Encounter: Payer: Self-pay | Admitting: Internal Medicine

## 2014-04-20 VITALS — BP 116/47 | HR 99 | Temp 97.0°F | Resp 16 | Ht 62.0 in | Wt 93.4 lb

## 2014-04-20 DIAGNOSIS — C3432 Malignant neoplasm of lower lobe, left bronchus or lung: Secondary | ICD-10-CM

## 2014-04-20 DIAGNOSIS — C3492 Malignant neoplasm of unspecified part of left bronchus or lung: Secondary | ICD-10-CM

## 2014-04-20 DIAGNOSIS — I10 Essential (primary) hypertension: Secondary | ICD-10-CM

## 2014-04-20 DIAGNOSIS — Z7189 Other specified counseling: Secondary | ICD-10-CM | POA: Diagnosis not present

## 2014-04-20 DIAGNOSIS — R634 Abnormal weight loss: Secondary | ICD-10-CM

## 2014-04-20 DIAGNOSIS — R531 Weakness: Secondary | ICD-10-CM
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
## 2014-04-20 NOTE — Telephone Encounter (Signed)
Pt confirmed labs/ov per 11/17 POF, gave pt AVS..... KJ
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
## 2014-04-25 NOTE — Progress Notes (Signed)
  Radiation Oncology         [phone] ________________________________  Name: [name] MRN: [id number]  Date: 04/26/2014  DOB: 01-12-1935   Follow-Up Visit Note  CC: [name], MD  [name], MD  Diagnosis:   79 yo woman with local recurrence of left lower lung cancer s/p previous SBRT followed by salvage conventional conformal radiotherapy 12/28/2013-02/11/2014 to 66 Gy in 33 fractions of 2 Gy    ICD-9-CM ICD-10-CM   1. Local recurrence of left lung cancer 162.9 C34.92     Interval Since Last Radiation:  2  months  Narrative:  NO SHOW _____________________________________  Sheral Apley. Tammi Klippel, M.D.

## 2014-04-26 ENCOUNTER — Telehealth: Payer: Self-pay | Admitting: Radiation Oncology

## 2014-04-26 ENCOUNTER — Ambulatory Visit
Admission: RE | Admit: 2014-04-26 | Discharge: 2014-04-26 | Disposition: A | Discharge status: Home / Self Care | Payer: Medicare Other | Source: Ambulatory Visit | Attending: Radiation Oncology | Admitting: Radiation Oncology

## 2014-04-26 DIAGNOSIS — C3492 Malignant neoplasm of unspecified part of left bronchus or lung: Secondary | ICD-10-CM

## 2014-04-26 NOTE — Telephone Encounter (Signed)
Patient did not show for 0845 one month follow up appointment. Phoned patient's home and her daughter's cell. No answer at either. Left message requesting return call to reschedule.

## 2014-05-04 ENCOUNTER — Telehealth: Payer: Self-pay | Admitting: Radiation Oncology

## 2014-05-04 NOTE — Telephone Encounter (Signed)
Janett Billow, RN reports that the patient has a sacral decubitus. RN requested verbal order to initiate protocol to manage decubitus. Verbal order given.

## 2014-05-10 DIAGNOSIS — R531 Weakness: Secondary | ICD-10-CM | POA: Insufficient documentation

## 2014-05-10 DIAGNOSIS — Z7189 Other specified counseling: Secondary | ICD-10-CM | POA: Insufficient documentation

## 2014-05-10 NOTE — Addendum Note (Signed)
Encounter addended by: Knox Royalty, NP on: 05/10/2014  8:43 AM<BR>     Documentation filed: Charges VN, Problem List, Clinical Notes

## 2014-05-17 ENCOUNTER — Other Ambulatory Visit: Payer: Self-pay | Admitting: Internal Medicine

## 2014-05-17 DIAGNOSIS — M5489 Other dorsalgia: Secondary | ICD-10-CM

## 2014-05-21 ENCOUNTER — Telehealth: Payer: Self-pay | Admitting: Radiation Oncology

## 2014-05-21 NOTE — Telephone Encounter (Signed)
Finally able to connect with Jinny Blossom, RN for [facility]. She states, "Vickie's daughter gave her three Tramadol tablets and she slept for 17 hours.Marland Kitchenlast night I check in on her and she is alert and stable but, I felt I should make you all aware." In addition, she reports the patient vomited twice yesterday. Megan, RN believes the vomiting was related to the Tramadol. She reports the patient's sacral decubitus has healed. Expressed appreciation for the update and confirmed a social work consult may be a good idea.

## 2014-05-31 ENCOUNTER — Telehealth: Payer: Self-pay | Admitting: Radiation Oncology

## 2014-05-31 NOTE — Telephone Encounter (Signed)
Faxed two completed and signed forms back to [employer]. Fax confirmation of delivery obtained.

## 2014-06-07 DIAGNOSIS — J449 Chronic obstructive pulmonary disease, unspecified: Secondary | ICD-10-CM | POA: Diagnosis not present

## 2014-06-07 DIAGNOSIS — C3490 Malignant neoplasm of unspecified part of unspecified bronchus or lung: Secondary | ICD-10-CM | POA: Diagnosis not present

## 2014-06-07 DIAGNOSIS — I10 Essential (primary) hypertension: Secondary | ICD-10-CM | POA: Diagnosis not present

## 2014-06-11 ENCOUNTER — Other Ambulatory Visit: Payer: Medicare Other

## 2014-06-14 ENCOUNTER — Other Ambulatory Visit: Payer: Medicare Other

## 2014-06-18 DIAGNOSIS — C3490 Malignant neoplasm of unspecified part of unspecified bronchus or lung: Secondary | ICD-10-CM | POA: Diagnosis not present

## 2014-06-18 DIAGNOSIS — J449 Chronic obstructive pulmonary disease, unspecified: Secondary | ICD-10-CM | POA: Diagnosis not present

## 2014-06-18 DIAGNOSIS — I10 Essential (primary) hypertension: Secondary | ICD-10-CM | POA: Diagnosis not present

## 2014-06-23 DIAGNOSIS — C3490 Malignant neoplasm of unspecified part of unspecified bronchus or lung: Secondary | ICD-10-CM | POA: Diagnosis not present

## 2014-06-23 DIAGNOSIS — I10 Essential (primary) hypertension: Secondary | ICD-10-CM | POA: Diagnosis not present

## 2014-06-23 DIAGNOSIS — J449 Chronic obstructive pulmonary disease, unspecified: Secondary | ICD-10-CM | POA: Diagnosis not present

## 2014-07-09 ENCOUNTER — Encounter: Payer: Self-pay | Admitting: Radiation Oncology

## 2014-07-09 ENCOUNTER — Ambulatory Visit
Admission: RE | Admit: 2014-07-09 | Discharge: 2014-07-09 | Disposition: A | Discharge status: Home / Self Care | Payer: Medicare Other | Source: Ambulatory Visit | Attending: Radiation Oncology | Admitting: Radiation Oncology

## 2014-07-09 VITALS — BP 171/72 | HR 104 | Resp 18 | Wt 90.2 lb

## 2014-07-09 DIAGNOSIS — C3492 Malignant neoplasm of unspecified part of left bronchus or lung: Secondary | ICD-10-CM

## 2014-07-09 MED ORDER — OXYCODONE-ACETAMINOPHEN 5-325 MG PO TABS: 1.0000 | ORAL_TABLET | ORAL | Status: DC | PRN

## 2014-07-09 MED ORDER — MEGESTROL ACETATE 625 MG/5ML PO SUSP: 625.0000 mg | Freq: Every day | ORAL | Status: DC

## 2014-07-09 NOTE — Progress Notes (Signed)
Radiation Oncology         [phone] ________________________________  Name: [name] MRN: [id number]  Date: 07/09/2014  DOB: 1935/01/27  Follow-Up Visit Note  CC: [name], MD  [name], MD  Diagnosis:   80 yo woman with local recurrence of left lower lung cancer s/p previous SBRT followed by salvage conventional conformal radiotherapy 12/28/2013-02/11/2014 to 66 Gy in 33 fractions of 2 Gy    ICD-9-CM ICD-10-CM   1. Local recurrence of left lung cancer 162.9 C34.92     Interval Since Last Radiation:  5  months  Narrative:  The patient returns today for routine follow-up.  Oxygen therapy 4 liters via nasal cannula noted. SOB noted. Reports an intermittent dry cough. Denies difficulty swallowing. Reports she fell four days ago taking out her trash. Requesting refill of percocet pain medication because tramadol isn't working. Denies pain at this time. Reports when she does have pain its in her left side and lower back. Reports nausea and vomiting. Weight loss noted. Decreased appetite reports. Denies headaches or dizziness.                               ALLERGIES:  is allergic to iohexol.  Meds: Current Outpatient Prescriptions  Medication Sig Dispense Refill  . activated charcoal-sorbitol (ACTIDOSE-SORBITOL) 50 GM/240ML LIQD Take 50 g by mouth once.    [name] aspirin 325 MG tablet Take 325 mg by mouth daily.    . calcium-vitamin D (OSCAL WITH D) 500-200 MG-UNIT per tablet Take 1 tablet by mouth daily with breakfast. 42 tablet 0  . diltiazem (CARDIZEM CD) 240 MG 24 hr capsule   3  . iron polysaccharides (NIFEREX) 150 MG capsule Take 150 mg by mouth daily.    [name] losartan (COZAAR) 25 MG tablet Take 25 mg by mouth daily.  1  . ondansetron (ZOFRAN) 4 MG tablet Take 4 mg by mouth every 6 (six) hours as needed.  0  . traMADol (ULTRAM) 50 MG tablet Take by mouth every 6 (six) hours as needed.  0  . albuterol (PROVENTIL) (2.5 MG/3ML) 0.083% nebulizer solution Take 3 mLs (2.5 mg  total) by nebulization every 4 (four) hours as needed for wheezing or shortness of breath. (Patient not taking: Reported on 07/09/2014) 75 mL 12  . antiseptic oral rinse (CPC / CETYLPYRIDINIUM CHLORIDE 0.05%) 0.05 % LIQD solution 7 mLs by Mouth Rinse route 2 (two) times daily. (Patient not taking: Reported on 07/09/2014) 710 mL 0  . diltiazem (DILACOR XR) 120 MG 24 hr capsule Take 120 mg by mouth daily.  3  . HYDROcodone-acetaminophen (NORCO/VICODIN) 5-325 MG per tablet Take 1-2 tablets by mouth every 6 (six) hours as needed for moderate pain (cough). (Patient not taking: Reported on 07/09/2014) 90 tablet 0  . ipratropium-albuterol (DUONEB) 0.5-2.5 (3) MG/3ML SOLN   5  . predniSONE (DELTASONE) 5 MG tablet 8 tablets for 5 days 7 tablets for 5 days 6 tablets for 5 days 5 tablets for 5 days 4 tablets for 5 days 3 tablets for 5 days 2 tablets for 5 days 1 tablet for 5 days then discontinue (Patient not taking: Reported on 07/09/2014) 300 tablet 0  . promethazine (PHENERGAN) 25 MG tablet Take 1 tablet (25 mg total) by mouth every 6 (six) hours as needed for nausea. (Patient not taking: Reported on 07/09/2014) 30 tablet 1  . senna (SENOKOT) 8.6 MG tablet Take 1 tablet by mouth daily.  No current facility-administered medications for this encounter.    Physical Findings: The patient is in no acute distress. Patient is alert and oriented.  weight is 90 lb 3.2 oz (40.914 kg). Her blood pressure is 171/72 and her pulse is 104. Her respiration is 18 and oxygen saturation is 83%.   Lungs CTA,  Heart RRR.  No significant changes.  Lab Findings: Lab Results  Component Value Date   WBC 6.3 04/13/2014   HGB 10.0* 04/13/2014   HCT 31.9* 04/13/2014   MCV 96.2 04/13/2014   PLT 395 04/13/2014    Impression:  The patient is recovering from the effects of radiation.  She is having pain and also poor appetite  Plan:  CT in 2 weeks, then seeing [facility].  Refilled percocet and adding  Megace.  _____________________________________  Sheral Apley Tammi Klippel, M.D.

## 2014-07-09 NOTE — Progress Notes (Signed)
Oxygen therapy 4 liters via nasal cannula noted. SOB noted. Reports an intermittent dry cough. Denies difficulty swallowing. Reports she fell four days ago taking out her trash. Requesting refill of percocet pain medication because tramadol isn't working. Denies pain at this time. Reports when she does have pain its in her left side and lower back. Reports nausea and vomiting. Weight loss noted. Decreased appetite reports. Denies headaches or dizziness.

## 2014-07-20 ENCOUNTER — Ambulatory Visit (HOSPITAL_COMMUNITY)
Admission: RE | Admit: 2014-07-20 | Discharge: 2014-07-20 | Disposition: A | Discharge status: Home / Self Care | Payer: Medicare Other | Source: Ambulatory Visit | Attending: Internal Medicine | Admitting: Internal Medicine

## 2014-07-20 ENCOUNTER — Other Ambulatory Visit: Payer: Self-pay | Admitting: Internal Medicine

## 2014-07-20 ENCOUNTER — Other Ambulatory Visit (HOSPITAL_BASED_OUTPATIENT_CLINIC_OR_DEPARTMENT_OTHER): Payer: Medicare Other

## 2014-07-20 DIAGNOSIS — Z85118 Personal history of other malignant neoplasm of bronchus and lung: Secondary | ICD-10-CM | POA: Diagnosis not present

## 2014-07-20 DIAGNOSIS — C3492 Malignant neoplasm of unspecified part of left bronchus or lung: Secondary | ICD-10-CM | POA: Diagnosis not present

## 2014-07-20 DIAGNOSIS — J432 Centrilobular emphysema: Secondary | ICD-10-CM | POA: Diagnosis not present

## 2014-07-20 LAB — COMPREHENSIVE METABOLIC PANEL (CC13)
ALK PHOS: 73 U/L (ref 40–150)
ALT: 10 U/L (ref 0–55)
ANION GAP: 11 meq/L (ref 3–11)
AST: 16 U/L (ref 5–34)
Albumin: 3.7 g/dL (ref 3.5–5.0)
BUN: 16.1 mg/dL (ref 7.0–26.0)
CALCIUM: 9.3 mg/dL (ref 8.4–10.4)
CO2: 29 mEq/L (ref 22–29)
Chloride: 99 mEq/L (ref 98–109)
Creatinine: 0.6 mg/dL (ref 0.6–1.1)
EGFR: 87 mL/min/{1.73_m2} — ABNORMAL LOW (ref >90)
Glucose: 107 mg/dl (ref 70–140)
POTASSIUM: 4.1 meq/L (ref 3.5–5.1)
Sodium: 139 mEq/L (ref 136–145)
Total Bilirubin: 0.3 mg/dL (ref 0.20–1.20)
Total Protein: 7 g/dL (ref 6.4–8.3)

## 2014-07-20 LAB — CBC WITH DIFFERENTIAL/PLATELET
BASO%: 0.1 % (ref 0.0–2.0)
Basophils Absolute: 0 10*3/uL (ref 0.0–0.1)
EOS%: 1.4 % (ref 0.0–7.0)
Eosinophils Absolute: 0.1 10*3/uL (ref 0.0–0.5)
HEMATOCRIT: 31.3 % — AB (ref 34.8–46.6)
HEMOGLOBIN: 10 g/dL — AB (ref 11.6–15.9)
LYMPH%: 16.3 % (ref 14.0–49.7)
MCH: 31.8 pg (ref 25.1–34.0)
MCHC: 31.9 g/dL (ref 31.5–36.0)
MCV: 99.7 fL (ref 79.5–101.0)
MONO#: 0.9 10*3/uL (ref 0.1–0.9)
MONO%: 9.6 % (ref 0.0–14.0)
NEUT%: 72.6 % (ref 38.4–76.8)
NEUTROS ABS: 6.4 10*3/uL (ref 1.5–6.5)
Platelets: 402 10*3/uL — ABNORMAL HIGH (ref 145–400)
RBC: 3.14 10*6/uL — ABNORMAL LOW (ref 3.70–5.45)
RDW: 17.3 % — ABNORMAL HIGH (ref 11.2–14.5)
WBC: 8.8 10*3/uL (ref 3.9–10.3)
lymph#: 1.4 10*3/uL (ref 0.9–3.3)

## 2014-07-26 ENCOUNTER — Ambulatory Visit (HOSPITAL_BASED_OUTPATIENT_CLINIC_OR_DEPARTMENT_OTHER): Payer: Medicare Other | Admitting: Internal Medicine

## 2014-07-26 ENCOUNTER — Encounter: Payer: Self-pay | Admitting: Internal Medicine

## 2014-07-26 ENCOUNTER — Telehealth: Payer: Self-pay | Admitting: Internal Medicine

## 2014-07-26 VITALS — BP 143/55 | HR 89 | Temp 97.7°F | Resp 17 | Ht 62.0 in | Wt 88.5 lb

## 2014-07-26 DIAGNOSIS — C3432 Malignant neoplasm of lower lobe, left bronchus or lung: Secondary | ICD-10-CM

## 2014-07-26 DIAGNOSIS — R634 Abnormal weight loss: Secondary | ICD-10-CM

## 2014-07-26 DIAGNOSIS — C3492 Malignant neoplasm of unspecified part of left bronchus or lung: Secondary | ICD-10-CM

## 2014-07-26 NOTE — Telephone Encounter (Signed)
s.w. pt and advised on May appt....ok and aware °
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
## 2014-08-02 ENCOUNTER — Other Ambulatory Visit: Payer: Self-pay | Admitting: Medical Oncology

## 2014-08-02 DIAGNOSIS — R630 Anorexia: Secondary | ICD-10-CM

## 2014-08-02 DIAGNOSIS — C3492 Malignant neoplasm of unspecified part of left bronchus or lung: Secondary | ICD-10-CM

## 2014-08-02 MED ORDER — MEGESTROL ACETATE 625 MG/5ML PO SUSP: 625.0000 mg | Freq: Every day | ORAL | Status: DC

## 2014-08-02 NOTE — Progress Notes (Signed)
Financial issue with megace . I sent rx to [facility] -generic now available.

## 2014-08-03 ENCOUNTER — Other Ambulatory Visit: Payer: Self-pay | Admitting: Medical Oncology

## 2014-08-03 DIAGNOSIS — C3492 Malignant neoplasm of unspecified part of left bronchus or lung: Secondary | ICD-10-CM

## 2014-08-03 MED ORDER — OXYCODONE-ACETAMINOPHEN 5-325 MG PO TABS: 1.0000 | ORAL_TABLET | ORAL | Status: DC | PRN

## 2014-08-03 NOTE — Progress Notes (Signed)
rx locked in injection room.

## 2014-08-16 ENCOUNTER — Telehealth: Payer: Self-pay | Admitting: *Deleted

## 2014-08-16 ENCOUNTER — Other Ambulatory Visit: Payer: Self-pay | Admitting: Medical Oncology

## 2014-08-16 DIAGNOSIS — C3492 Malignant neoplasm of unspecified part of left bronchus or lung: Secondary | ICD-10-CM

## 2014-08-16 MED ORDER — OXYCODONE-ACETAMINOPHEN 5-325 MG PO TABS: 1.0000 | ORAL_TABLET | ORAL | Status: DC | PRN

## 2014-08-16 NOTE — Telephone Encounter (Signed)
I am taking care of this

## 2014-08-16 NOTE — Telephone Encounter (Signed)
Daughter called asking if she can take this prescription to the [facility].  This nurse does not know what has been set up for her use or a balance at [facility].  Daughter reports a balance of $255.00 is left on her account.  Informed her she can try filling at [facility].

## 2014-08-16 NOTE — Progress Notes (Signed)
rx locked in injection room

## 2014-08-16 NOTE — Telephone Encounter (Signed)
No new note, added to a previous call note instead.

## 2014-08-16 NOTE — Telephone Encounter (Signed)
Patient called requesting a refill of Percocet.  Today is her only day off this week, so she is asking to pick the Rx up today.

## 2014-08-30 ENCOUNTER — Other Ambulatory Visit: Payer: Self-pay | Admitting: *Deleted

## 2014-08-30 DIAGNOSIS — C3492 Malignant neoplasm of unspecified part of left bronchus or lung: Secondary | ICD-10-CM

## 2014-08-30 MED ORDER — OXYCODONE-ACETAMINOPHEN 5-325 MG PO TABS
1.0000 | ORAL_TABLET | ORAL | Status: DC | PRN
Start: 2014-08-30 — End: 2014-09-13

## 2014-08-30 NOTE — Telephone Encounter (Signed)
Refill medication: Percocet. Patient family member notified and she verbalized understanding.

## 2014-08-31 DIAGNOSIS — J449 Chronic obstructive pulmonary disease, unspecified: Secondary | ICD-10-CM | POA: Diagnosis not present

## 2014-09-10 ENCOUNTER — Telehealth: Payer: Self-pay | Admitting: *Deleted

## 2014-09-10 NOTE — Telephone Encounter (Signed)
Received call from pt's daughter requesting refill for Percocet. Will pick up on Monday, 09/13/14

## 2014-09-11 NOTE — Telephone Encounter (Signed)
OK to refill

## 2014-09-13 ENCOUNTER — Other Ambulatory Visit: Payer: Self-pay | Admitting: Medical Oncology

## 2014-09-13 DIAGNOSIS — C3492 Malignant neoplasm of unspecified part of left bronchus or lung: Secondary | ICD-10-CM

## 2014-09-13 MED ORDER — OXYCODONE-ACETAMINOPHEN 5-325 MG PO TABS: 1.0000 | ORAL_TABLET | ORAL | Status: DC | PRN

## 2014-09-16 DIAGNOSIS — L989 Disorder of the skin and subcutaneous tissue, unspecified: Secondary | ICD-10-CM | POA: Diagnosis not present

## 2014-09-16 DIAGNOSIS — R399 Unspecified symptoms and signs involving the genitourinary system: Secondary | ICD-10-CM | POA: Diagnosis not present

## 2014-09-23 DIAGNOSIS — R399 Unspecified symptoms and signs involving the genitourinary system: Secondary | ICD-10-CM | POA: Diagnosis not present

## 2014-09-23 DIAGNOSIS — B079 Viral wart, unspecified: Secondary | ICD-10-CM | POA: Diagnosis not present

## 2014-09-24 ENCOUNTER — Other Ambulatory Visit: Payer: Self-pay | Admitting: *Deleted

## 2014-09-24 ENCOUNTER — Telehealth: Payer: Self-pay | Admitting: *Deleted

## 2014-09-24 DIAGNOSIS — C3492 Malignant neoplasm of unspecified part of left bronchus or lung: Secondary | ICD-10-CM

## 2014-09-24 NOTE — Telephone Encounter (Deleted)
Called pt to discuss request for refill of Percocert

## 2014-09-24 NOTE — Telephone Encounter (Signed)
TC from pt's daughter, [name] requesting refill on pt's percocet. She states it is refilled every 2 weeks and due to be filled on 09/27/14 as was last filled on 09/13/14. Please call daughter [name] @ [phone]

## 2014-09-24 NOTE — Telephone Encounter (Signed)
Pt's dtr called stating need to pick up prescription on Monday " because I was told I had to give a 24 hour notice "  Prescription verified as percocet with last fill approximately 2 weeks ago with dtr.  Prescription pended and sent to MD and RN at desk.

## 2014-09-24 NOTE — Telephone Encounter (Signed)
Discussed with pt she should not be out of pain medicine, pt advised " I dont handle the pain pills, my daughter handles all of that" Instructed pt per MD to speak with Dr. Maudie Mercury her PCP for further management of pain medication as she is not actively being treated. Pt verbalized understanding and confirmed she would call after we hang up. Call to Anna,RN at Dr. Noel Journey office and informed her of above information. Vicente Males advised pt has an appt on 4/27 with Dr. Maudie Mercury she will call pt with a reminder. No further concerns.

## 2014-09-24 NOTE — Telephone Encounter (Signed)
Reviewed with MD, called pt to discuss she is currently on observation and this medication will need to be filled by Dr. Maudie Mercury PCP or we can assist pt with a Pain clinic referral.

## 2014-09-24 NOTE — Telephone Encounter (Signed)
Returned call to [name], pt's daughter discussed pt pain to be managed by Dr. Maudie Mercury- PCP. [name] verbalized understanding pt will pick up a rx for pt on Monday from Dr. Noel Journey office. No further concerns.

## 2014-09-29 DIAGNOSIS — C3490 Malignant neoplasm of unspecified part of unspecified bronchus or lung: Secondary | ICD-10-CM | POA: Diagnosis not present

## 2014-10-01 DIAGNOSIS — J449 Chronic obstructive pulmonary disease, unspecified: Secondary | ICD-10-CM | POA: Diagnosis not present

## 2014-10-15 ENCOUNTER — Telehealth: Payer: Self-pay | Admitting: Internal Medicine

## 2014-10-15 NOTE — Telephone Encounter (Signed)
returned call and lvm for pt regarding to May appts

## 2014-10-18 ENCOUNTER — Other Ambulatory Visit (HOSPITAL_BASED_OUTPATIENT_CLINIC_OR_DEPARTMENT_OTHER): Payer: Medicare Other

## 2014-10-18 ENCOUNTER — Ambulatory Visit (HOSPITAL_COMMUNITY)
Admission: RE | Admit: 2014-10-18 | Discharge: 2014-10-18 | Disposition: A | Discharge status: Home / Self Care | Payer: Medicare Other | Source: Ambulatory Visit | Attending: Internal Medicine | Admitting: Internal Medicine

## 2014-10-18 ENCOUNTER — Telehealth: Payer: Self-pay | Admitting: Internal Medicine

## 2014-10-18 ENCOUNTER — Telehealth: Payer: Self-pay | Admitting: Medical Oncology

## 2014-10-18 DIAGNOSIS — J439 Emphysema, unspecified: Secondary | ICD-10-CM | POA: Diagnosis not present

## 2014-10-18 DIAGNOSIS — C3432 Malignant neoplasm of lower lobe, left bronchus or lung: Secondary | ICD-10-CM | POA: Diagnosis not present

## 2014-10-18 DIAGNOSIS — J90 Pleural effusion, not elsewhere classified: Secondary | ICD-10-CM | POA: Diagnosis not present

## 2014-10-18 DIAGNOSIS — R0602 Shortness of breath: Secondary | ICD-10-CM | POA: Insufficient documentation

## 2014-10-18 DIAGNOSIS — E875 Hyperkalemia: Secondary | ICD-10-CM

## 2014-10-18 DIAGNOSIS — C3492 Malignant neoplasm of unspecified part of left bronchus or lung: Secondary | ICD-10-CM | POA: Insufficient documentation

## 2014-10-18 LAB — COMPREHENSIVE METABOLIC PANEL (CC13)
ALBUMIN: 3.7 g/dL (ref 3.5–5.0)
ALK PHOS: 81 U/L (ref 40–150)
ALT: 14 U/L (ref 0–55)
AST: 19 U/L (ref 5–34)
Anion Gap: 11 mEq/L (ref 3–11)
BUN: 15.1 mg/dL (ref 7.0–26.0)
CO2: 33 mEq/L — ABNORMAL HIGH (ref 22–29)
Calcium: 10.5 mg/dL — ABNORMAL HIGH (ref 8.4–10.4)
Chloride: 99 mEq/L (ref 98–109)
Creatinine: 0.6 mg/dL (ref 0.6–1.1)
EGFR: 90 mL/min/{1.73_m2} — AB (ref >90)
GLUCOSE: 106 mg/dL (ref 70–140)
Potassium: 5.5 mEq/L — ABNORMAL HIGH (ref 3.5–5.1)
Sodium: 143 mEq/L (ref 136–145)
TOTAL PROTEIN: 7.4 g/dL (ref 6.4–8.3)
Total Bilirubin: 0.32 mg/dL (ref 0.20–1.20)

## 2014-10-18 MED ORDER — IOHEXOL 300 MG/ML  SOLN
100.0000 mL | Freq: Once | INTRAMUSCULAR | Status: AC | PRN
  Administered 2014-10-18: 100 mL via INTRAVENOUS

## 2014-10-18 NOTE — Telephone Encounter (Signed)
I left message on pt home phone.

## 2014-10-18 NOTE — Telephone Encounter (Signed)
Left message for patient re lab appointment for 10/20/14 @ 10:45am.

## 2014-10-18 NOTE — Telephone Encounter (Signed)
-----  Message from [name], MD sent at 10/18/2014 12:55 PM EDT ----- Call patient with the result and repeat B Met in few days. Discontinue any K supplements if taking.

## 2014-10-18 NOTE — Progress Notes (Signed)
Quick Note:  Call patient with the result and repeat B Met in few days. Discontinue any K supplements if taking. ______

## 2014-10-20 ENCOUNTER — Other Ambulatory Visit (HOSPITAL_BASED_OUTPATIENT_CLINIC_OR_DEPARTMENT_OTHER): Payer: Medicare Other

## 2014-10-20 DIAGNOSIS — E875 Hyperkalemia: Secondary | ICD-10-CM

## 2014-10-20 DIAGNOSIS — C3432 Malignant neoplasm of lower lobe, left bronchus or lung: Secondary | ICD-10-CM

## 2014-10-20 LAB — BASIC METABOLIC PANEL (CC13)
Anion Gap: 12 mEq/L — ABNORMAL HIGH (ref 3–11)
BUN: 15.3 mg/dL (ref 7.0–26.0)
CHLORIDE: 98 meq/L (ref 98–109)
CO2: 32 mEq/L — ABNORMAL HIGH (ref 22–29)
Calcium: 9.8 mg/dL (ref 8.4–10.4)
Creatinine: 0.6 mg/dL (ref 0.6–1.1)
EGFR: 86 mL/min/{1.73_m2} — AB (ref >90)
Glucose: 123 mg/dl (ref 70–140)
Potassium: 5.5 mEq/L — ABNORMAL HIGH (ref 3.5–5.1)
Sodium: 142 mEq/L (ref 136–145)

## 2014-10-20 NOTE — Progress Notes (Signed)
Quick Note:  Call patient with the result and order Kayexalate 30 ml po X 1 ______

## 2014-10-21 ENCOUNTER — Telehealth: Payer: Self-pay | Admitting: Medical Oncology

## 2014-10-21 ENCOUNTER — Other Ambulatory Visit: Payer: Self-pay | Admitting: Medical Oncology

## 2014-10-21 DIAGNOSIS — E875 Hyperkalemia: Secondary | ICD-10-CM

## 2014-10-21 MED ORDER — SODIUM POLYSTYRENE SULFONATE PO POWD: Freq: Once | ORAL | Status: DC

## 2014-10-21 NOTE — Telephone Encounter (Signed)
Daughter notified and pt is taking  a "dietary supplement" and cozaar, I instructed daughter to not give pt sorbitol for the next 2 days due to kayexalate interaction. Cmet faxed to dr Jeneen Rinks kim.

## 2014-10-21 NOTE — Telephone Encounter (Signed)
Pt received several bottles of kayexalate and I instructed daughter to only give pt one bottle 15 gm /60 ml and to not take anymore. Daughter stated she only gave Vickie the one bottle 42m. She voices understanding.

## 2014-10-21 NOTE — Telephone Encounter (Signed)
-----   Message from [name], MD sent at 10/20/2014  5:34 PM EDT ----- Call patient with the result and order Kayexalate 30 ml po X 1

## 2014-10-25 ENCOUNTER — Telehealth: Payer: Self-pay | Admitting: Internal Medicine

## 2014-10-25 ENCOUNTER — Encounter: Payer: Self-pay | Admitting: Internal Medicine

## 2014-10-25 ENCOUNTER — Encounter: Payer: Self-pay | Admitting: *Deleted

## 2014-10-25 ENCOUNTER — Ambulatory Visit (HOSPITAL_BASED_OUTPATIENT_CLINIC_OR_DEPARTMENT_OTHER): Payer: Medicare Other | Admitting: Internal Medicine

## 2014-10-25 VITALS — BP 185/67 | HR 94 | Temp 97.6°F | Resp 18 | Ht 62.0 in | Wt 85.5 lb

## 2014-10-25 DIAGNOSIS — C3432 Malignant neoplasm of lower lobe, left bronchus or lung: Secondary | ICD-10-CM

## 2014-10-25 DIAGNOSIS — R634 Abnormal weight loss: Secondary | ICD-10-CM

## 2014-10-25 MED ORDER — METHYLPREDNISOLONE 4 MG PO TBPK: ORAL_TABLET | ORAL | Status: DC

## 2014-10-25 NOTE — Progress Notes (Signed)
Oncology Nurse Navigator Documentation  Oncology Nurse Navigator Flowsheets 10/25/2014  Navigator Encounter Type 6 month  Patient Visit Type Follow-up, office visit with Dr. Julien Nordmann   Treatment Phase Other  Barriers/Navigation Needs Education. Patient is currently on observation.  She has lost weight.  Dr. Julien Nordmann has prescribed steroids to help boost appetite.  I asked Ms. Leath if she would like to see a nutritionist, she stated not at this time.  I encouraged her to increase caloric intake.  She stated she would and likes to eat ice cream.    Education Other  Interventions Education Method  Education Method Teach-back  Time Spent with Patient 15
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
## 2014-10-25 NOTE — Telephone Encounter (Signed)
s.w.l pt daughter and Unk Pinto on Sept appt....pt ok and aware

## 2014-10-27 DIAGNOSIS — G4700 Insomnia, unspecified: Secondary | ICD-10-CM | POA: Diagnosis not present

## 2014-10-27 DIAGNOSIS — E875 Hyperkalemia: Secondary | ICD-10-CM | POA: Diagnosis not present

## 2014-10-31 DIAGNOSIS — J449 Chronic obstructive pulmonary disease, unspecified: Secondary | ICD-10-CM | POA: Diagnosis not present

## 2014-11-10 DIAGNOSIS — E875 Hyperkalemia: Secondary | ICD-10-CM | POA: Diagnosis not present

## 2014-11-10 DIAGNOSIS — M549 Dorsalgia, unspecified: Secondary | ICD-10-CM | POA: Diagnosis not present

## 2014-11-10 DIAGNOSIS — J449 Chronic obstructive pulmonary disease, unspecified: Secondary | ICD-10-CM | POA: Diagnosis not present

## 2014-11-10 DIAGNOSIS — I10 Essential (primary) hypertension: Secondary | ICD-10-CM | POA: Diagnosis not present

## 2014-12-01 DIAGNOSIS — J449 Chronic obstructive pulmonary disease, unspecified: Secondary | ICD-10-CM | POA: Diagnosis not present

## 2014-12-07 DIAGNOSIS — N390 Urinary tract infection, site not specified: Secondary | ICD-10-CM | POA: Diagnosis not present

## 2014-12-22 DIAGNOSIS — K469 Unspecified abdominal hernia without obstruction or gangrene: Secondary | ICD-10-CM | POA: Diagnosis not present

## 2014-12-31 DIAGNOSIS — J449 Chronic obstructive pulmonary disease, unspecified: Secondary | ICD-10-CM | POA: Diagnosis not present

## 2015-01-21 DIAGNOSIS — K458 Other specified abdominal hernia without obstruction or gangrene: Secondary | ICD-10-CM | POA: Diagnosis not present

## 2015-01-22 IMAGING — CT CT CHEST W/O CM
2 of 6 series · 15 of 36 positions shown, 18 images · non-contrast
Comparison: 03/15/2014.

CLINICAL DATA: Restaging of left-sided lung cancer. Radiation
therapy and chemotherapy completed 8143.

EXAM:
CT CHEST WITHOUT CONTRAST
TECHNIQUE: Multidetector CT imaging of the chest was performed following the
standard protocol without IV contrast..

[Series 10: chest w/o st · axial · non-contrast · 0.57mm/px · z∈[+993,+1258]mm · 12 of 63 slices shown, 15 images]
[im 5/63  mediastinal]
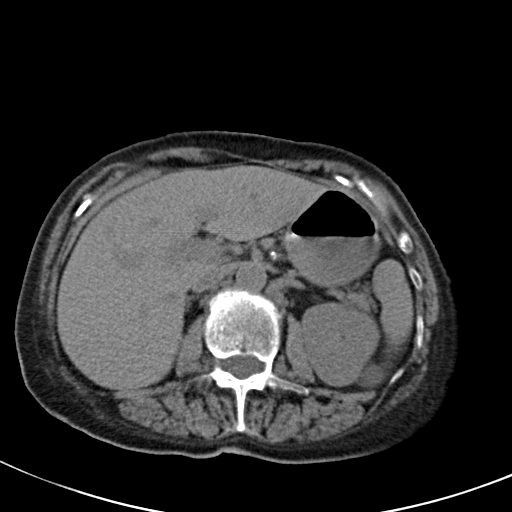
[im 5/63  lung]
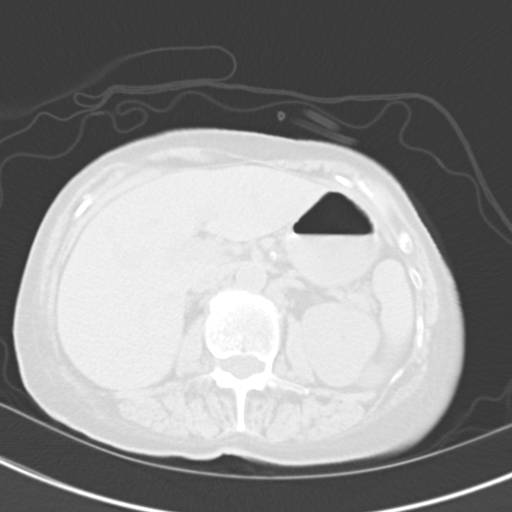
[im 10/63  lung]
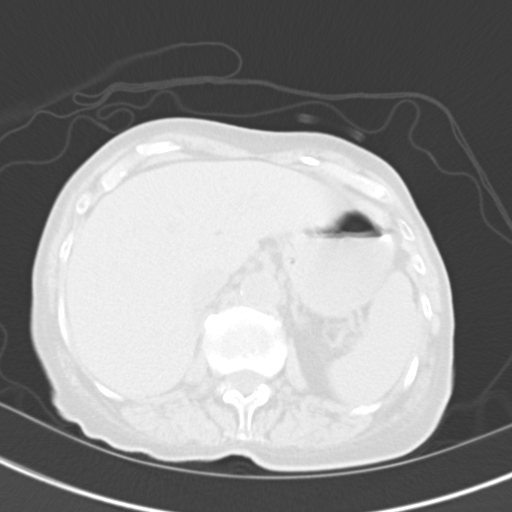
[im 15/63  lung]
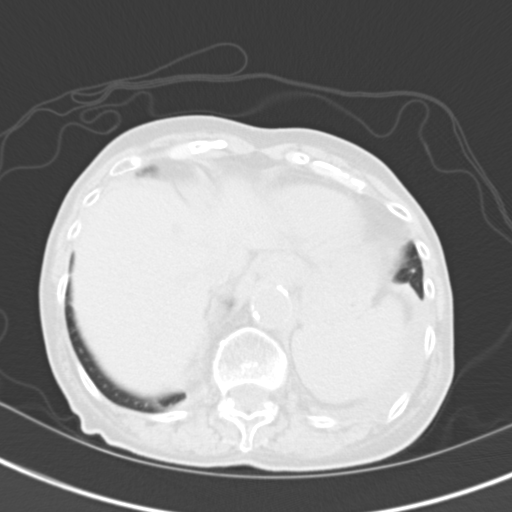
[im 20/63  lung]
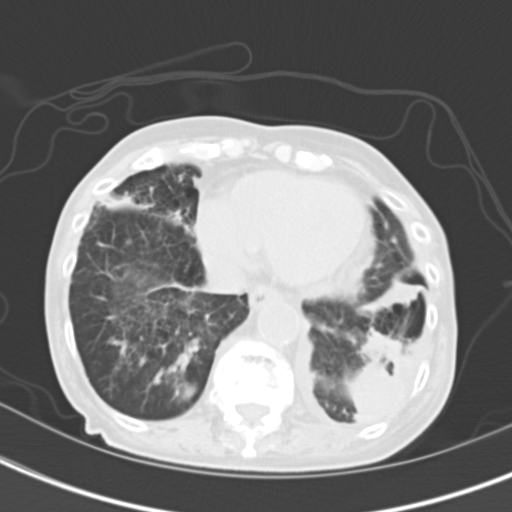
[im 24/63  mediastinal]
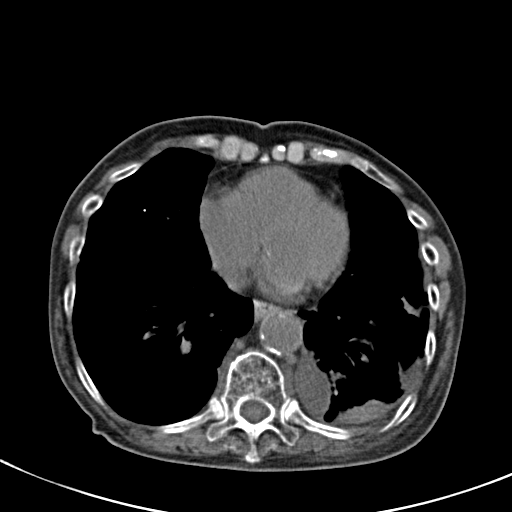
[im 24/63  lung]
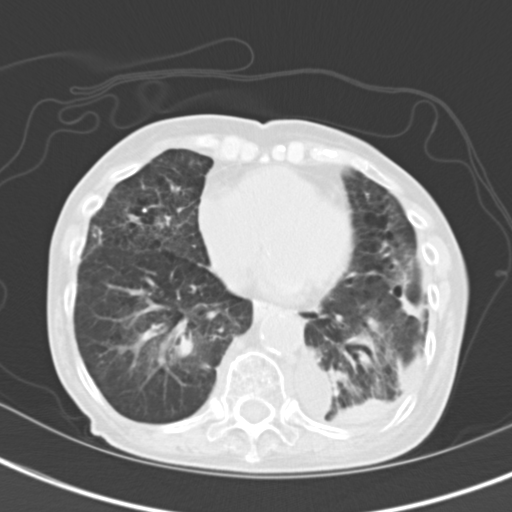
[im 29/63  lung]
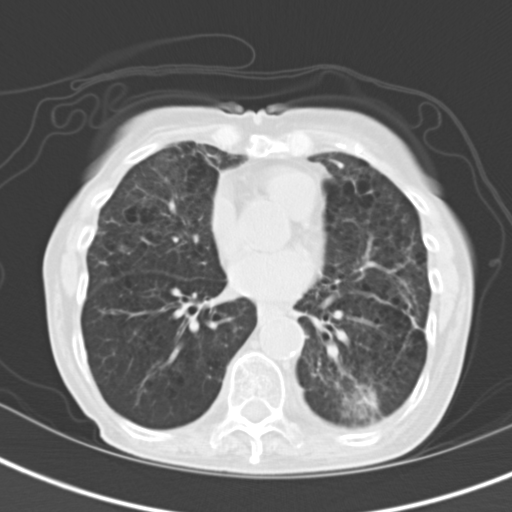
[im 34/63  lung]
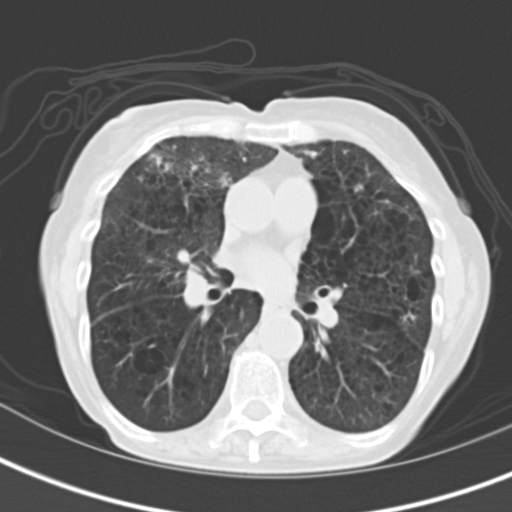
[im 39/63  lung]
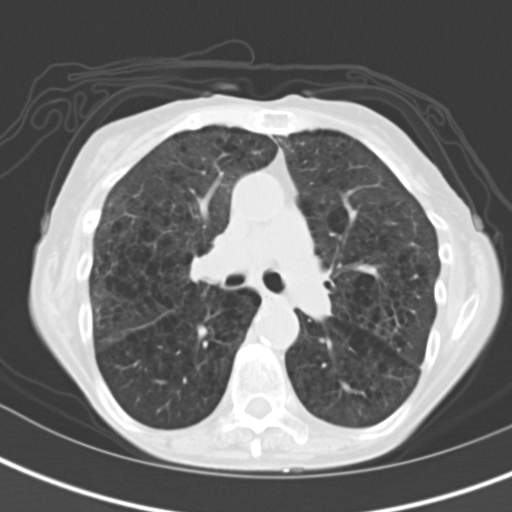
[im 43/63  mediastinal]
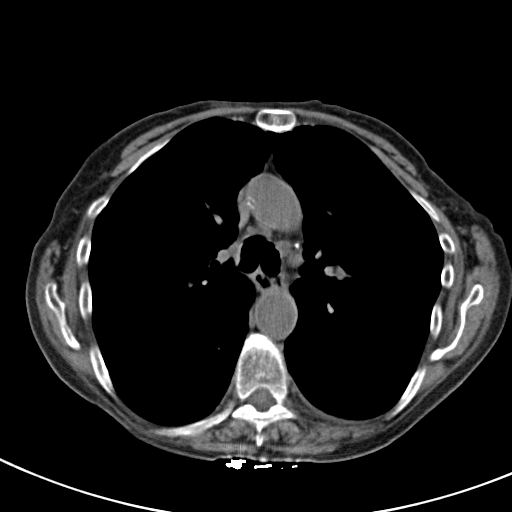
[im 43/63  lung]
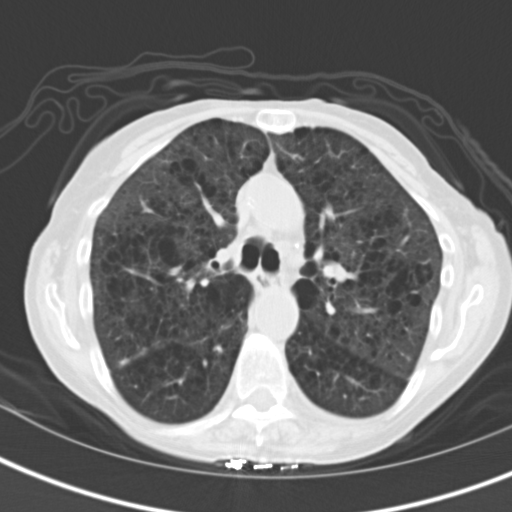
[im 48/63  lung]
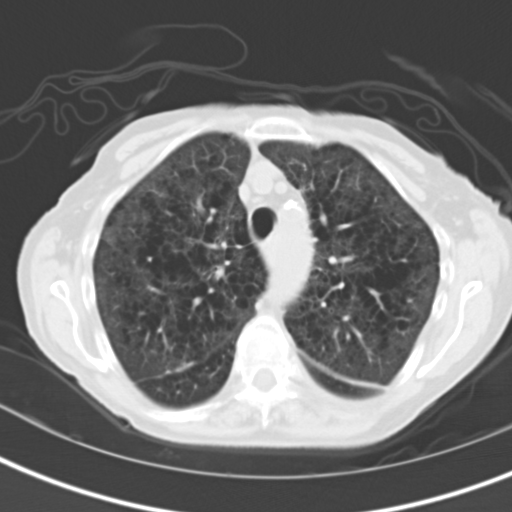
[im 53/63  lung]
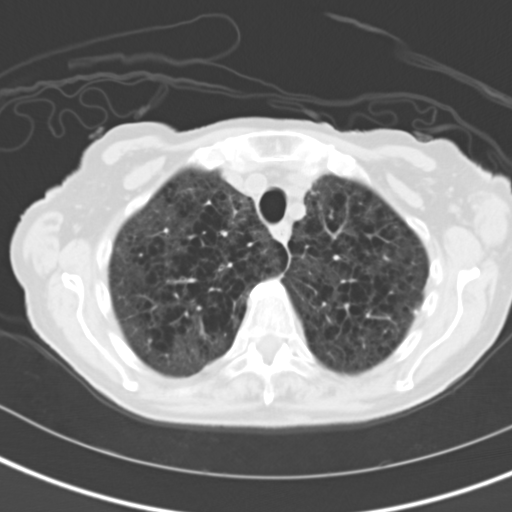
[im 58/63  lung]
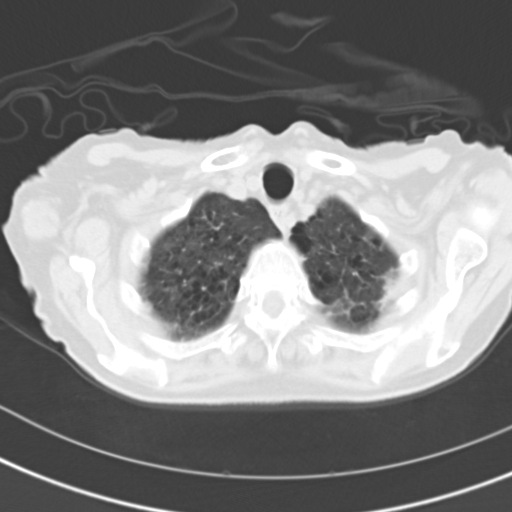

[Series 602: <mpr thick range> · coronal · 0.61mm/px · 3 of 70 slices shown]
[im 14/70  lung]
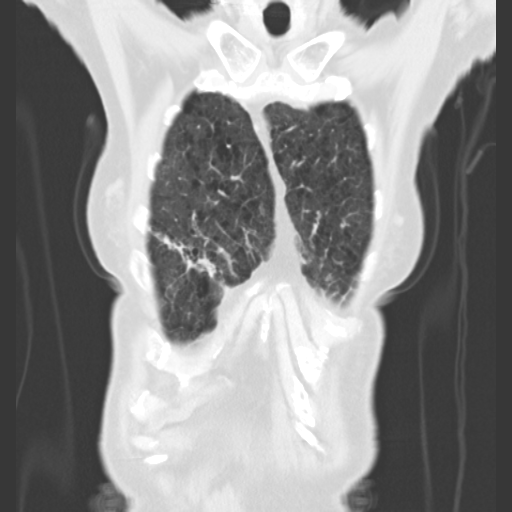
[im 28/70  lung]
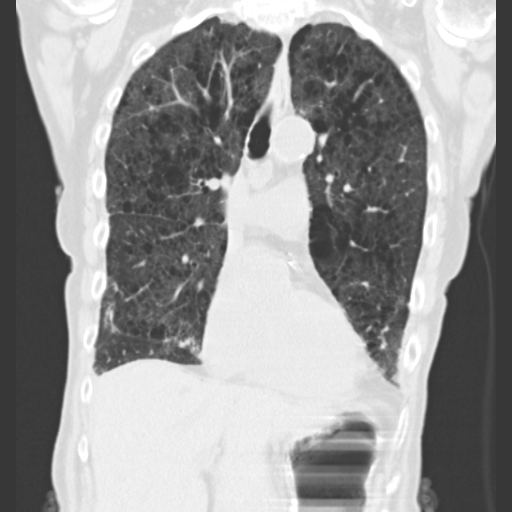
[im 42/70  lung]
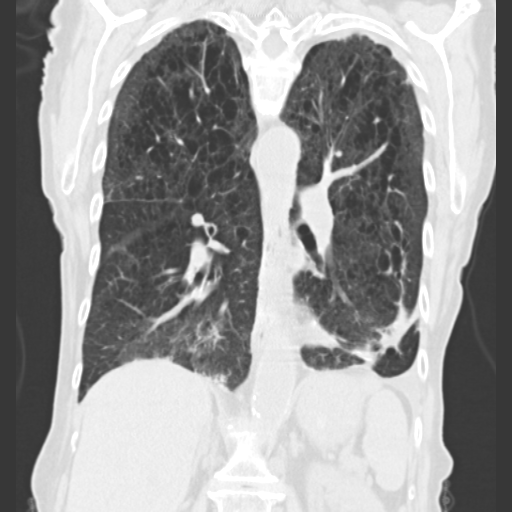

[15 of 36 positions shown; findings below may reference images not displayed]

FINDINGS: Lungs/Pleura:  Moderate to marked centrilobular emphysema.

Left apical pleural parenchymal scarring.

Improvement in patchy airspace disease involving the right upper
lobe and right lower lobe. A 3 mm left upper lobe nodule on image 33
of series 9 is similar to on the prior exam.

Improvement in left base aeration since 03/15/2014. Dense
consolidation has resolved. There is an area of rounded masslike
opacity in the dependent left lower lobe. This measures 2.5 x 2.8 cm
on image 43. When compared back to the 10/05/2013 exam (the last
study without dense consolidation in this area) a nodule at the site
measured 2.2 x 1.9 cm. Adjacent dependent opacities which are
favored to primarily represent atelectasis.

Mild left-sided pleural thickening with small volume pleural fluid.
Similar. Minimal loculated fluid medially, including on image 39.

Heart/Mediastinum: No supraclavicular adenopathy. Aortic and branch
vessel atherosclerosis. Normal heart size, without pericardial
effusion. Multivessel coronary artery atherosclerosis. 1.0 cm
precarinal node on image 22 is enlarged from 8 mm on the prior.
Hilar regions poorly evaluated without intravenous contrast.

Upper Abdomen: Well-circumscribed low-density liver lesions which
are likely small cysts. Normal imaged portions of the spleen,
stomach, pancreas, adrenal glands, left kidney.

Bones/Musculoskeletal: Moderate T12 compression deformity with
minimal ventral canal encroachment. Unchanged. Subtle sclerosis
involving the fifth lateral right rib, including on sagittal image
16 of series 603. This was present on 03/15/2014, but not readily
apparent on 10/30/2013.
IMPRESSION: 1. Centrilobular emphysema with improvement in patchy airspace
disease, favored to represent infection. This decreases accuracy for
evaluating primary bronchogenic carcinoma.
2. Improved left base aeration. Masslike opacity dependently could
represent enlargement of the nodule detailed on the 10/30/2013 PET
and/or a component of rounded atelectasis.
3. Similar small left pleural effusion with pleural thickening and
minimal loculation.
4. Development of borderline mediastinal adenopathy. This could be
reactive or metastatic. Recommend attention on follow-up.
5. Similar T12 compression deformity. Sclerosis involving the
lateral right fifth rib which is new when compared back to
10/05/2013. Correlate with trauma in this area. Isolated sclerotic
metastasis felt less likely.

## 2015-01-31 DIAGNOSIS — J449 Chronic obstructive pulmonary disease, unspecified: Secondary | ICD-10-CM | POA: Diagnosis not present

## 2015-02-04 DIAGNOSIS — I10 Essential (primary) hypertension: Secondary | ICD-10-CM | POA: Diagnosis not present

## 2015-02-04 DIAGNOSIS — M810 Age-related osteoporosis without current pathological fracture: Secondary | ICD-10-CM | POA: Diagnosis not present

## 2015-02-11 DIAGNOSIS — I10 Essential (primary) hypertension: Secondary | ICD-10-CM | POA: Diagnosis not present

## 2015-02-11 DIAGNOSIS — J449 Chronic obstructive pulmonary disease, unspecified: Secondary | ICD-10-CM | POA: Diagnosis not present

## 2015-02-11 DIAGNOSIS — D649 Anemia, unspecified: Secondary | ICD-10-CM | POA: Diagnosis not present

## 2015-02-11 DIAGNOSIS — Z23 Encounter for immunization: Secondary | ICD-10-CM | POA: Diagnosis not present

## 2015-02-12 DIAGNOSIS — J449 Chronic obstructive pulmonary disease, unspecified: Secondary | ICD-10-CM | POA: Diagnosis not present

## 2015-02-22 ENCOUNTER — Other Ambulatory Visit (HOSPITAL_BASED_OUTPATIENT_CLINIC_OR_DEPARTMENT_OTHER): Payer: Medicare Other

## 2015-02-22 DIAGNOSIS — C3432 Malignant neoplasm of lower lobe, left bronchus or lung: Secondary | ICD-10-CM | POA: Diagnosis not present

## 2015-02-22 LAB — CBC WITH DIFFERENTIAL/PLATELET
BASO%: 0.5 % (ref 0.0–2.0)
Basophils Absolute: 0 10*3/uL (ref 0.0–0.1)
EOS ABS: 0.1 10*3/uL (ref 0.0–0.5)
EOS%: 0.8 % (ref 0.0–7.0)
HCT: 31 % — ABNORMAL LOW (ref 34.8–46.6)
HGB: 10.2 g/dL — ABNORMAL LOW (ref 11.6–15.9)
LYMPH%: 10 % — AB (ref 14.0–49.7)
MCH: 35.5 pg — AB (ref 25.1–34.0)
MCHC: 32.9 g/dL (ref 31.5–36.0)
MCV: 107.9 fL — AB (ref 79.5–101.0)
MONO#: 0.8 10*3/uL (ref 0.1–0.9)
MONO%: 7.6 % (ref 0.0–14.0)
NEUT%: 81.1 % — AB (ref 38.4–76.8)
NEUTROS ABS: 8.2 10*3/uL — AB (ref 1.5–6.5)
PLATELETS: 401 10*3/uL — AB (ref 145–400)
RBC: 2.87 10*6/uL — AB (ref 3.70–5.45)
RDW: 17.1 % — ABNORMAL HIGH (ref 11.2–14.5)
WBC: 10.1 10*3/uL (ref 3.9–10.3)
lymph#: 1 10*3/uL (ref 0.9–3.3)

## 2015-02-22 LAB — COMPREHENSIVE METABOLIC PANEL (CC13)
ALT: 11 U/L (ref 0–55)
AST: 17 U/L (ref 5–34)
Albumin: 3.7 g/dL (ref 3.5–5.0)
Alkaline Phosphatase: 63 U/L (ref 40–150)
Anion Gap: 7 mEq/L (ref 3–11)
BUN: 12.9 mg/dL (ref 7.0–26.0)
CHLORIDE: 100 meq/L (ref 98–109)
CO2: 33 mEq/L — ABNORMAL HIGH (ref 22–29)
CREATININE: 0.6 mg/dL (ref 0.6–1.1)
Calcium: 10.5 mg/dL — ABNORMAL HIGH (ref 8.4–10.4)
EGFR: 86 mL/min/{1.73_m2} — ABNORMAL LOW (ref >90)
Glucose: 162 mg/dl — ABNORMAL HIGH (ref 70–140)
POTASSIUM: 4.2 meq/L (ref 3.5–5.1)
Sodium: 140 mEq/L (ref 136–145)
Total Bilirubin: 0.38 mg/dL (ref 0.20–1.20)
Total Protein: 7.3 g/dL (ref 6.4–8.3)

## 2015-03-01 ENCOUNTER — Ambulatory Visit (HOSPITAL_BASED_OUTPATIENT_CLINIC_OR_DEPARTMENT_OTHER): Payer: Medicare Other | Admitting: Internal Medicine

## 2015-03-01 ENCOUNTER — Telehealth: Payer: Self-pay | Admitting: Internal Medicine

## 2015-03-01 ENCOUNTER — Encounter: Payer: Self-pay | Admitting: Internal Medicine

## 2015-03-01 VITALS — BP 156/68 | HR 108 | Temp 98.2°F | Resp 17 | Ht 62.0 in | Wt 84.0 lb

## 2015-03-01 DIAGNOSIS — C3492 Malignant neoplasm of unspecified part of left bronchus or lung: Secondary | ICD-10-CM

## 2015-03-01 NOTE — Telephone Encounter (Signed)
s.w. pt dtr she did not want to sched 107mh f/u per pof. Md advised her that he will let her know when the next visit should be based on ct scan
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
## 2015-03-02 ENCOUNTER — Other Ambulatory Visit: Payer: Self-pay | Admitting: Internal Medicine

## 2015-03-02 DIAGNOSIS — C3432 Malignant neoplasm of lower lobe, left bronchus or lung: Secondary | ICD-10-CM

## 2015-03-03 ENCOUNTER — Ambulatory Visit (HOSPITAL_COMMUNITY)
Admission: RE | Admit: 2015-03-03 | Discharge: 2015-03-03 | Disposition: A | Discharge status: Home / Self Care | Payer: Medicare Other | Source: Ambulatory Visit | Attending: Internal Medicine | Admitting: Internal Medicine

## 2015-03-03 ENCOUNTER — Encounter (HOSPITAL_COMMUNITY): Payer: Self-pay

## 2015-03-03 DIAGNOSIS — J449 Chronic obstructive pulmonary disease, unspecified: Secondary | ICD-10-CM | POA: Diagnosis not present

## 2015-03-03 DIAGNOSIS — J432 Centrilobular emphysema: Secondary | ICD-10-CM | POA: Insufficient documentation

## 2015-03-03 DIAGNOSIS — J439 Emphysema, unspecified: Secondary | ICD-10-CM | POA: Diagnosis not present

## 2015-03-03 DIAGNOSIS — C3432 Malignant neoplasm of lower lobe, left bronchus or lung: Secondary | ICD-10-CM | POA: Diagnosis not present

## 2015-03-10 ENCOUNTER — Telehealth: Payer: Self-pay | Admitting: *Deleted

## 2015-03-10 NOTE — Telephone Encounter (Signed)
The scan is stable. No new concerning findings. Follow up as scheduled.

## 2015-03-10 NOTE — Telephone Encounter (Signed)
Dtr.:  Delila Spence is calling.  Her mother had a CT scan on 9/29.  Dr. Julien Nordmann was to call them with results and let them know if they can wait another 6 months to come in or if they need to come in sooner.  They have not heard anything and were wondering what to do?  "Is she still in stage 1 or has she progressed?"    Also dtr would like Dr. Julien Nordmann to write a letter for her (dtr. Delila Spence) stating that she needs to take 35 days off from school at "[employer]" in [place] due to her mother's health.  She would like a leave from school to be able to spend more time with her Mom for a little while.  She would like to have the letter Monday. She would like the leave to begin Tuesday October 11th.  Her cal back is  [phone].

## 2015-03-11 ENCOUNTER — Encounter: Payer: Self-pay | Admitting: Medical Oncology

## 2015-03-11 NOTE — Progress Notes (Signed)
Letter up front lobby for pick up ,[place] notified.

## 2015-03-11 NOTE — Telephone Encounter (Signed)
[facility] notified.

## 2015-03-14 DIAGNOSIS — J449 Chronic obstructive pulmonary disease, unspecified: Secondary | ICD-10-CM | POA: Diagnosis not present

## 2015-04-02 DIAGNOSIS — J449 Chronic obstructive pulmonary disease, unspecified: Secondary | ICD-10-CM | POA: Diagnosis not present

## 2015-04-14 DIAGNOSIS — J449 Chronic obstructive pulmonary disease, unspecified: Secondary | ICD-10-CM | POA: Diagnosis not present

## 2015-05-03 DIAGNOSIS — J449 Chronic obstructive pulmonary disease, unspecified: Secondary | ICD-10-CM | POA: Diagnosis not present

## 2015-05-06 DIAGNOSIS — D649 Anemia, unspecified: Secondary | ICD-10-CM | POA: Diagnosis not present

## 2015-05-13 DIAGNOSIS — J449 Chronic obstructive pulmonary disease, unspecified: Secondary | ICD-10-CM | POA: Diagnosis not present

## 2015-05-13 DIAGNOSIS — I10 Essential (primary) hypertension: Secondary | ICD-10-CM | POA: Diagnosis not present

## 2015-05-13 DIAGNOSIS — M549 Dorsalgia, unspecified: Secondary | ICD-10-CM | POA: Diagnosis not present

## 2015-05-13 DIAGNOSIS — C3490 Malignant neoplasm of unspecified part of unspecified bronchus or lung: Secondary | ICD-10-CM | POA: Diagnosis not present

## 2015-05-19 DIAGNOSIS — J449 Chronic obstructive pulmonary disease, unspecified: Secondary | ICD-10-CM | POA: Diagnosis not present

## 2015-05-19 DIAGNOSIS — I10 Essential (primary) hypertension: Secondary | ICD-10-CM | POA: Diagnosis not present

## 2015-05-19 DIAGNOSIS — R05 Cough: Secondary | ICD-10-CM | POA: Diagnosis not present

## 2015-05-19 DIAGNOSIS — M549 Dorsalgia, unspecified: Secondary | ICD-10-CM | POA: Diagnosis not present

## 2015-05-19 DIAGNOSIS — C3490 Malignant neoplasm of unspecified part of unspecified bronchus or lung: Secondary | ICD-10-CM | POA: Diagnosis not present

## 2015-05-19 DIAGNOSIS — M25551 Pain in right hip: Secondary | ICD-10-CM | POA: Diagnosis not present

## 2015-06-02 DIAGNOSIS — J449 Chronic obstructive pulmonary disease, unspecified: Secondary | ICD-10-CM | POA: Diagnosis not present

## 2015-06-22 ENCOUNTER — Emergency Department (HOSPITAL_COMMUNITY): Payer: Medicare Other

## 2015-06-22 ENCOUNTER — Emergency Department (HOSPITAL_COMMUNITY)
Admission: EM | Admit: 2015-06-22 | Discharge: 2015-06-22 | Discharge status: Against Medical Advice | Payer: Medicare Other | Attending: Emergency Medicine | Admitting: Emergency Medicine

## 2015-06-22 ENCOUNTER — Encounter (HOSPITAL_COMMUNITY): Payer: Self-pay | Admitting: Emergency Medicine

## 2015-06-22 DIAGNOSIS — I2510 Atherosclerotic heart disease of native coronary artery without angina pectoris: Secondary | ICD-10-CM | POA: Insufficient documentation

## 2015-06-22 DIAGNOSIS — J449 Chronic obstructive pulmonary disease, unspecified: Secondary | ICD-10-CM | POA: Diagnosis not present

## 2015-06-22 DIAGNOSIS — Z7982 Long term (current) use of aspirin: Secondary | ICD-10-CM | POA: Diagnosis not present

## 2015-06-22 DIAGNOSIS — Z85118 Personal history of other malignant neoplasm of bronchus and lung: Secondary | ICD-10-CM | POA: Diagnosis not present

## 2015-06-22 DIAGNOSIS — R4182 Altered mental status, unspecified: Secondary | ICD-10-CM | POA: Insufficient documentation

## 2015-06-22 DIAGNOSIS — Z87448 Personal history of other diseases of urinary system: Secondary | ICD-10-CM | POA: Diagnosis not present

## 2015-06-22 DIAGNOSIS — Z87828 Personal history of other (healed) physical injury and trauma: Secondary | ICD-10-CM | POA: Diagnosis not present

## 2015-06-22 DIAGNOSIS — R05 Cough: Secondary | ICD-10-CM | POA: Diagnosis not present

## 2015-06-22 DIAGNOSIS — M19049 Primary osteoarthritis, unspecified hand: Secondary | ICD-10-CM | POA: Insufficient documentation

## 2015-06-22 DIAGNOSIS — R0602 Shortness of breath: Secondary | ICD-10-CM | POA: Diagnosis not present

## 2015-06-22 DIAGNOSIS — F17210 Nicotine dependence, cigarettes, uncomplicated: Secondary | ICD-10-CM | POA: Insufficient documentation

## 2015-06-22 DIAGNOSIS — Z79899 Other long term (current) drug therapy: Secondary | ICD-10-CM | POA: Diagnosis not present

## 2015-06-22 DIAGNOSIS — I10 Essential (primary) hypertension: Secondary | ICD-10-CM | POA: Diagnosis not present

## 2015-06-22 LAB — COMPREHENSIVE METABOLIC PANEL
ALBUMIN: 3.3 g/dL — AB (ref 3.5–5.0)
ALK PHOS: 78 U/L (ref 38–126)
ALT: 13 U/L — ABNORMAL LOW (ref 14–54)
ANION GAP: 11 (ref 5–15)
AST: 16 U/L (ref 15–41)
BILIRUBIN TOTAL: 0.7 mg/dL (ref 0.3–1.2)
BUN: 14 mg/dL (ref 6–20)
CALCIUM: 9.9 mg/dL (ref 8.9–10.3)
CO2: 40 mmol/L — ABNORMAL HIGH (ref 22–32)
Chloride: 91 mmol/L — ABNORMAL LOW (ref 101–111)
Creatinine, Ser: 0.41 mg/dL — ABNORMAL LOW (ref 0.44–1.00)
GLUCOSE: 116 mg/dL — AB (ref 65–99)
POTASSIUM: 4.1 mmol/L (ref 3.5–5.1)
Sodium: 142 mmol/L (ref 135–145)
TOTAL PROTEIN: 7.2 g/dL (ref 6.5–8.1)

## 2015-06-22 LAB — BASIC METABOLIC PANEL
ANION GAP: 13 (ref 5–15)
BUN: 14 mg/dL (ref 6–20)
CALCIUM: 10.2 mg/dL (ref 8.9–10.3)
CO2: 39 mmol/L — ABNORMAL HIGH (ref 22–32)
CREATININE: 0.5 mg/dL (ref 0.44–1.00)
Chloride: 92 mmol/L — ABNORMAL LOW (ref 101–111)
GLUCOSE: 120 mg/dL — AB (ref 65–99)
Potassium: 4.2 mmol/L (ref 3.5–5.1)
Sodium: 144 mmol/L (ref 135–145)

## 2015-06-22 LAB — URINALYSIS, ROUTINE W REFLEX MICROSCOPIC
BILIRUBIN URINE: NEGATIVE
GLUCOSE, UA: NEGATIVE mg/dL
Hgb urine dipstick: NEGATIVE
KETONES UR: NEGATIVE mg/dL
Nitrite: NEGATIVE
PH: 7 (ref 5.0–8.0)
PROTEIN: 30 mg/dL — AB
Specific Gravity, Urine: 1.025 (ref 1.005–1.030)

## 2015-06-22 LAB — CBC WITH DIFFERENTIAL/PLATELET
BASOS ABS: 0 10*3/uL (ref 0.0–0.1)
BASOS PCT: 0 %
EOS PCT: 0 %
Eosinophils Absolute: 0 10*3/uL (ref 0.0–0.7)
HCT: 32.5 % — ABNORMAL LOW (ref 36.0–46.0)
Hemoglobin: 10.4 g/dL — ABNORMAL LOW (ref 12.0–15.0)
Lymphocytes Relative: 4 %
Lymphs Abs: 0.8 10*3/uL (ref 0.7–4.0)
MCH: 34.9 pg — ABNORMAL HIGH (ref 26.0–34.0)
MCHC: 32 g/dL (ref 30.0–36.0)
MCV: 109.1 fL — ABNORMAL HIGH (ref 78.0–100.0)
MONO ABS: 1.5 10*3/uL — AB (ref 0.1–1.0)
MONOS PCT: 8 %
Neutro Abs: 15.9 10*3/uL — ABNORMAL HIGH (ref 1.7–7.7)
Neutrophils Relative %: 88 %
PLATELETS: 447 10*3/uL — AB (ref 150–400)
RBC: 2.98 MIL/uL — ABNORMAL LOW (ref 3.87–5.11)
RDW: 16.3 % — AB (ref 11.5–15.5)
WBC: 18.2 10*3/uL — ABNORMAL HIGH (ref 4.0–10.5)

## 2015-06-22 LAB — URINE MICROSCOPIC-ADD ON

## 2015-06-22 LAB — LACTIC ACID, PLASMA: Lactic Acid, Venous: 0.8 mmol/L (ref 0.5–2.0)

## 2015-06-22 NOTE — ED Notes (Signed)
Per family-states patient has been delusional-daughter states she increased her oxygen to $L [place] and it improved her mental status-states has been going on for days but worse this am

## 2015-06-22 NOTE — ED Provider Notes (Signed)
Medical screening examination/treatment/procedure(s) were conducted as a shared visit with non-physician practitioner(s) and myself.  I personally evaluated the patient during the encounter.   EKG Interpretation None     Patient here after having transient no status changes that improved after being giving an increase in oxygen. She's had a baseline this time. No recent illnesses noted except for some increased cough. Denies any fever. We'll evaluate labs and imaging and treat appropriately  Lacretia Leigh, MD 06/22/15 1201
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
## 2015-06-22 NOTE — Discharge Instructions (Signed)
Urine was sent for culture - you will be called in 2-3 days if signs of infection were present Follow up with your primary care physician.

## 2015-06-23 ENCOUNTER — Telehealth: Payer: Self-pay | Admitting: Medical Oncology

## 2015-06-23 LAB — URINE CULTURE

## 2015-06-23 NOTE — Telephone Encounter (Signed)
Per Dr Julien Nordmann I told Angie Dr Maudie Mercury will evaluate pt pain first

## 2015-06-23 NOTE — Telephone Encounter (Signed)
Pt's daughter, Janace Hoard,  reports pt pain in lower back and front ribs have increased. She is taking percocet 10/325 every 8 hours. She has appt with Dr Maudie Mercury on Monday for her med refills. She is asking for pt to have a scan. ( Angie brought her mother to ED yesterday for her concern for altered mental status. Lab , CXR , CT head and urine culture sent and pt left AMA. CT head -negative. Note to [place].

## 2015-06-27 DIAGNOSIS — M549 Dorsalgia, unspecified: Secondary | ICD-10-CM | POA: Diagnosis not present

## 2015-06-27 DIAGNOSIS — Z720 Tobacco use: Secondary | ICD-10-CM | POA: Diagnosis not present

## 2015-06-27 DIAGNOSIS — J449 Chronic obstructive pulmonary disease, unspecified: Secondary | ICD-10-CM | POA: Diagnosis not present

## 2015-06-27 DIAGNOSIS — F1120 Opioid dependence, uncomplicated: Secondary | ICD-10-CM | POA: Diagnosis not present

## 2015-06-27 DIAGNOSIS — F419 Anxiety disorder, unspecified: Secondary | ICD-10-CM | POA: Diagnosis not present

## 2015-06-27 DIAGNOSIS — Z5181 Encounter for therapeutic drug level monitoring: Secondary | ICD-10-CM | POA: Diagnosis not present

## 2015-07-03 DIAGNOSIS — J449 Chronic obstructive pulmonary disease, unspecified: Secondary | ICD-10-CM | POA: Diagnosis not present

## 2015-07-08 ENCOUNTER — Ambulatory Visit: Payer: Medicare Other | Admitting: Podiatry

## 2015-07-19 DIAGNOSIS — J449 Chronic obstructive pulmonary disease, unspecified: Secondary | ICD-10-CM | POA: Diagnosis not present

## 2015-07-19 DIAGNOSIS — C3490 Malignant neoplasm of unspecified part of unspecified bronchus or lung: Secondary | ICD-10-CM | POA: Diagnosis not present

## 2015-07-19 DIAGNOSIS — J188 Other pneumonia, unspecified organism: Secondary | ICD-10-CM | POA: Diagnosis not present

## 2015-07-28 ENCOUNTER — Other Ambulatory Visit: Payer: Self-pay | Admitting: Internal Medicine

## 2015-07-28 DIAGNOSIS — M545 Low back pain: Secondary | ICD-10-CM

## 2015-07-28 DIAGNOSIS — M810 Age-related osteoporosis without current pathological fracture: Secondary | ICD-10-CM | POA: Diagnosis not present

## 2015-07-28 DIAGNOSIS — J449 Chronic obstructive pulmonary disease, unspecified: Secondary | ICD-10-CM | POA: Diagnosis not present

## 2015-07-28 DIAGNOSIS — C3490 Malignant neoplasm of unspecified part of unspecified bronchus or lung: Secondary | ICD-10-CM | POA: Diagnosis not present

## 2015-07-28 DIAGNOSIS — G8929 Other chronic pain: Secondary | ICD-10-CM | POA: Diagnosis not present

## 2015-07-28 DIAGNOSIS — M549 Dorsalgia, unspecified: Secondary | ICD-10-CM | POA: Diagnosis not present

## 2015-07-29 ENCOUNTER — Other Ambulatory Visit: Payer: Medicare Other

## 2015-08-01 ENCOUNTER — Ambulatory Visit
Admission: RE | Admit: 2015-08-01 | Discharge: 2015-08-01 | Disposition: A | Discharge status: Home / Self Care | Payer: Medicare Other | Source: Ambulatory Visit | Attending: Internal Medicine | Admitting: Internal Medicine

## 2015-08-01 DIAGNOSIS — M545 Low back pain: Secondary | ICD-10-CM

## 2015-08-01 DIAGNOSIS — M5126 Other intervertebral disc displacement, lumbar region: Secondary | ICD-10-CM | POA: Diagnosis not present

## 2015-08-01 MED ORDER — GADOBENATE DIMEGLUMINE 529 MG/ML IV SOLN
7.0000 mL | Freq: Once | INTRAVENOUS | Status: AC | PRN
  Administered 2015-08-01: 7 mL via INTRAVENOUS

## 2015-08-02 DIAGNOSIS — J449 Chronic obstructive pulmonary disease, unspecified: Secondary | ICD-10-CM | POA: Diagnosis not present

## 2015-08-16 DIAGNOSIS — I10 Essential (primary) hypertension: Secondary | ICD-10-CM | POA: Diagnosis not present

## 2015-08-23 ENCOUNTER — Other Ambulatory Visit: Payer: Medicare Other

## 2015-08-23 ENCOUNTER — Other Ambulatory Visit: Payer: Self-pay

## 2015-08-23 NOTE — Patient Outreach (Signed)
[place] [facility]) Care Management  08/23/2015  [name] [facility] [birth year] [id number]   Telephone call to patient regarding [employer] high risk referral. Unable to reach patient,. HIPAA compliant voice message left with call back phone number.   PLAN:  RNCM will attempt 2nd telephone outreach to patient within 1 week.   Quinn Plowman RN,BSN,CCM [facility] Telephonic  [phone]

## 2015-08-26 ENCOUNTER — Other Ambulatory Visit: Payer: Self-pay

## 2015-08-26 NOTE — Patient Outreach (Signed)
[place] [facility]) Care Management  08/26/2015  Mignon Bechler [facility] [birth year] [id number]   Telephone call to patient regarding high risk referral. Unable to reach patient. HIPAA compliant voice message left with call  Back phone number .  PLAN:  RNCM will attempt 3rd telephone outreach within 1 week.  Quinn Plowman RN,BSN,CCM [facility] Telephonic  [phone]

## 2015-08-29 ENCOUNTER — Ambulatory Visit (HOSPITAL_COMMUNITY): Payer: Medicare Other

## 2015-08-30 ENCOUNTER — Other Ambulatory Visit: Payer: Self-pay

## 2015-08-30 ENCOUNTER — Ambulatory Visit (HOSPITAL_COMMUNITY)
Admission: RE | Admit: 2015-08-30 | Discharge: 2015-08-30 | Disposition: A | Discharge status: Home / Self Care | Payer: Medicare Other | Source: Ambulatory Visit | Attending: Internal Medicine | Admitting: Internal Medicine

## 2015-08-30 ENCOUNTER — Telehealth: Payer: Self-pay | Admitting: Internal Medicine

## 2015-08-30 ENCOUNTER — Ambulatory Visit: Payer: Medicare Other | Admitting: Internal Medicine

## 2015-08-30 DIAGNOSIS — C3492 Malignant neoplasm of unspecified part of left bronchus or lung: Secondary | ICD-10-CM | POA: Diagnosis not present

## 2015-08-30 DIAGNOSIS — C3490 Malignant neoplasm of unspecified part of unspecified bronchus or lung: Secondary | ICD-10-CM | POA: Diagnosis not present

## 2015-08-30 DIAGNOSIS — R918 Other nonspecific abnormal finding of lung field: Secondary | ICD-10-CM | POA: Insufficient documentation

## 2015-08-30 DIAGNOSIS — J90 Pleural effusion, not elsewhere classified: Secondary | ICD-10-CM | POA: Insufficient documentation

## 2015-08-30 NOTE — Telephone Encounter (Signed)
returned call and lvm for pt with d.t of new appt

## 2015-08-30 NOTE — Patient Outreach (Addendum)
[place] [facility]) Care Management  08/30/2015  [name] [facility] 1935-04-09 [id number]  Telephone call to patient regarding [facility] high risk referral.  Unable to reach patient.  Call attempted to patients home number and listed mobile number. HIPAA compliant voice message left with call back phone number.  RNCM contacted Dr. Jeneen Rinks Kim's office and left message with Diannah to notify Dr. Maudie Mercury or his nurse of inability to establish contact with patient. Requested assistance with engaging patient to [facility] care management services.   PLAN:  RNCM will send patient outreach letter to attempt contact.   Quinn Plowman RN,BSN,CCM [facility] Telephonic  [phone]

## 2015-08-31 DIAGNOSIS — J449 Chronic obstructive pulmonary disease, unspecified: Secondary | ICD-10-CM | POA: Diagnosis not present

## 2015-09-05 ENCOUNTER — Ambulatory Visit: Payer: Medicare Other | Admitting: Internal Medicine

## 2015-09-08 ENCOUNTER — Telehealth: Payer: Self-pay | Admitting: Internal Medicine

## 2015-09-08 NOTE — Telephone Encounter (Signed)
returned call and s.w. pt and r.s appt per pt request....pt ok and aware °

## 2015-09-12 ENCOUNTER — Other Ambulatory Visit: Payer: Self-pay

## 2015-09-12 NOTE — Patient Outreach (Signed)
[facility] [facility]) Care Management  09/12/2015  Mitra Duling [facility] 1934/09/12 [id number]  No response from 3 telephone calls and letter outreach. PLAN;  RNCM will refer patient to Josepha Pigg to close due to inability to establish contact with patient. RNCM will notify patients primary MD of inability to establish contact with patient.   Quinn Plowman RN,BSN,CCM [facility] Telephonic  [phone]

## 2015-09-19 ENCOUNTER — Ambulatory Visit (HOSPITAL_BASED_OUTPATIENT_CLINIC_OR_DEPARTMENT_OTHER): Payer: Medicare Other | Admitting: Internal Medicine

## 2015-09-19 ENCOUNTER — Telehealth: Payer: Self-pay | Admitting: Internal Medicine

## 2015-09-19 ENCOUNTER — Encounter: Payer: Self-pay | Admitting: Internal Medicine

## 2015-09-19 ENCOUNTER — Encounter: Payer: Self-pay | Admitting: *Deleted

## 2015-09-19 VITALS — BP 145/56 | HR 93 | Temp 97.1°F | Resp 18 | Ht 62.0 in | Wt 87.0 lb

## 2015-09-19 DIAGNOSIS — C3432 Malignant neoplasm of lower lobe, left bronchus or lung: Secondary | ICD-10-CM

## 2015-09-19 DIAGNOSIS — C3492 Malignant neoplasm of unspecified part of left bronchus or lung: Secondary | ICD-10-CM

## 2015-09-19 NOTE — Progress Notes (Signed)
Oncology Nurse Navigator Documentation  Oncology Nurse Navigator Flowsheets 09/19/2015  Navigator Encounter Type Clinic/MDC  Treatment Phase Other  Barriers/Navigation Needs No barriers at this time  Interventions None required  Acuity Level 1  Acuity Level 1 Minimal follow up required  Time Spent with Patient 15   Spoke with patient today at [facility].  She is on observation and doing well.  No barriers identified at this time.

## 2015-09-19 NOTE — Telephone Encounter (Signed)
Gave and printed appt sched and avs fo rpt for OCT °
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
## 2015-10-01 DIAGNOSIS — J449 Chronic obstructive pulmonary disease, unspecified: Secondary | ICD-10-CM | POA: Diagnosis not present

## 2015-10-17 DIAGNOSIS — K5900 Constipation, unspecified: Secondary | ICD-10-CM | POA: Diagnosis not present

## 2015-10-17 DIAGNOSIS — J449 Chronic obstructive pulmonary disease, unspecified: Secondary | ICD-10-CM | POA: Diagnosis not present

## 2015-10-17 DIAGNOSIS — L918 Other hypertrophic disorders of the skin: Secondary | ICD-10-CM | POA: Diagnosis not present

## 2015-10-31 DIAGNOSIS — J449 Chronic obstructive pulmonary disease, unspecified: Secondary | ICD-10-CM | POA: Diagnosis not present

## 2015-11-10 DIAGNOSIS — I10 Essential (primary) hypertension: Secondary | ICD-10-CM | POA: Diagnosis not present

## 2015-11-10 DIAGNOSIS — D649 Anemia, unspecified: Secondary | ICD-10-CM | POA: Diagnosis not present

## 2015-11-15 DIAGNOSIS — R197 Diarrhea, unspecified: Secondary | ICD-10-CM | POA: Diagnosis not present

## 2015-11-15 DIAGNOSIS — J449 Chronic obstructive pulmonary disease, unspecified: Secondary | ICD-10-CM | POA: Diagnosis not present

## 2015-11-15 DIAGNOSIS — Z23 Encounter for immunization: Secondary | ICD-10-CM | POA: Diagnosis not present

## 2015-11-15 DIAGNOSIS — M549 Dorsalgia, unspecified: Secondary | ICD-10-CM | POA: Diagnosis not present

## 2015-11-15 DIAGNOSIS — I10 Essential (primary) hypertension: Secondary | ICD-10-CM | POA: Diagnosis not present

## 2015-11-15 DIAGNOSIS — Z0000 Encounter for general adult medical examination without abnormal findings: Secondary | ICD-10-CM | POA: Diagnosis not present

## 2015-12-01 DIAGNOSIS — J449 Chronic obstructive pulmonary disease, unspecified: Secondary | ICD-10-CM | POA: Diagnosis not present

## 2015-12-20 ENCOUNTER — Emergency Department (HOSPITAL_COMMUNITY): Payer: Medicare Other

## 2015-12-20 ENCOUNTER — Observation Stay (HOSPITAL_COMMUNITY)
Admission: EM | Admit: 2015-12-20 | Discharge: 2015-12-23 | Disposition: A | Discharge status: Skilled Nursing Facility | Payer: Medicare Other | Attending: Internal Medicine | Admitting: Internal Medicine

## 2015-12-20 DIAGNOSIS — W1809XA Striking against other object with subsequent fall, initial encounter: Secondary | ICD-10-CM | POA: Diagnosis not present

## 2015-12-20 DIAGNOSIS — J9611 Chronic respiratory failure with hypoxia: Secondary | ICD-10-CM | POA: Diagnosis not present

## 2015-12-20 DIAGNOSIS — J449 Chronic obstructive pulmonary disease, unspecified: Secondary | ICD-10-CM | POA: Diagnosis not present

## 2015-12-20 DIAGNOSIS — N390 Urinary tract infection, site not specified: Secondary | ICD-10-CM | POA: Diagnosis present

## 2015-12-20 DIAGNOSIS — M25561 Pain in right knee: Secondary | ICD-10-CM | POA: Diagnosis not present

## 2015-12-20 DIAGNOSIS — Z79899 Other long term (current) drug therapy: Secondary | ICD-10-CM | POA: Insufficient documentation

## 2015-12-20 DIAGNOSIS — Z96641 Presence of right artificial hip joint: Secondary | ICD-10-CM | POA: Diagnosis not present

## 2015-12-20 DIAGNOSIS — F17210 Nicotine dependence, cigarettes, uncomplicated: Secondary | ICD-10-CM | POA: Insufficient documentation

## 2015-12-20 DIAGNOSIS — I700 Atherosclerosis of aorta: Secondary | ICD-10-CM | POA: Insufficient documentation

## 2015-12-20 DIAGNOSIS — Z720 Tobacco use: Secondary | ICD-10-CM | POA: Diagnosis present

## 2015-12-20 DIAGNOSIS — Y92000 Kitchen of unspecified non-institutional (private) residence as  the place of occurrence of the external cause: Secondary | ICD-10-CM | POA: Insufficient documentation

## 2015-12-20 DIAGNOSIS — M19049 Primary osteoarthritis, unspecified hand: Secondary | ICD-10-CM | POA: Diagnosis not present

## 2015-12-20 DIAGNOSIS — C3492 Malignant neoplasm of unspecified part of left bronchus or lung: Secondary | ICD-10-CM | POA: Diagnosis present

## 2015-12-20 DIAGNOSIS — Z9981 Dependence on supplemental oxygen: Secondary | ICD-10-CM | POA: Diagnosis not present

## 2015-12-20 DIAGNOSIS — W19XXXA Unspecified fall, initial encounter: Secondary | ICD-10-CM | POA: Diagnosis present

## 2015-12-20 DIAGNOSIS — Z7982 Long term (current) use of aspirin: Secondary | ICD-10-CM | POA: Insufficient documentation

## 2015-12-20 DIAGNOSIS — M25559 Pain in unspecified hip: Secondary | ICD-10-CM | POA: Diagnosis not present

## 2015-12-20 DIAGNOSIS — I10 Essential (primary) hypertension: Secondary | ICD-10-CM | POA: Diagnosis not present

## 2015-12-20 DIAGNOSIS — K5900 Constipation, unspecified: Secondary | ICD-10-CM | POA: Insufficient documentation

## 2015-12-20 DIAGNOSIS — R140 Abdominal distension (gaseous): Secondary | ICD-10-CM

## 2015-12-20 DIAGNOSIS — M549 Dorsalgia, unspecified: Secondary | ICD-10-CM | POA: Diagnosis not present

## 2015-12-20 DIAGNOSIS — M25551 Pain in right hip: Secondary | ICD-10-CM | POA: Insufficient documentation

## 2015-12-20 DIAGNOSIS — Z681 Body mass index (BMI) 19 or less, adult: Secondary | ICD-10-CM | POA: Insufficient documentation

## 2015-12-20 DIAGNOSIS — S299XXA Unspecified injury of thorax, initial encounter: Secondary | ICD-10-CM | POA: Diagnosis not present

## 2015-12-20 DIAGNOSIS — Z66 Do not resuscitate: Secondary | ICD-10-CM | POA: Insufficient documentation

## 2015-12-20 DIAGNOSIS — S3992XA Unspecified injury of lower back, initial encounter: Secondary | ICD-10-CM | POA: Diagnosis not present

## 2015-12-20 DIAGNOSIS — E43 Unspecified severe protein-calorie malnutrition: Secondary | ICD-10-CM | POA: Diagnosis present

## 2015-12-20 DIAGNOSIS — M546 Pain in thoracic spine: Secondary | ICD-10-CM | POA: Diagnosis not present

## 2015-12-20 DIAGNOSIS — Z923 Personal history of irradiation: Secondary | ICD-10-CM | POA: Diagnosis not present

## 2015-12-20 LAB — CBC
HCT: 31.6 % — ABNORMAL LOW (ref 36.0–46.0)
Hemoglobin: 9.6 g/dL — ABNORMAL LOW (ref 12.0–15.0)
MCH: 32.5 pg (ref 26.0–34.0)
MCHC: 30.4 g/dL (ref 30.0–36.0)
MCV: 107.1 fL — ABNORMAL HIGH (ref 78.0–100.0)
PLATELETS: 495 10*3/uL — AB (ref 150–400)
RBC: 2.95 MIL/uL — AB (ref 3.87–5.11)
RDW: 15.6 % — AB (ref 11.5–15.5)
WBC: 15.2 10*3/uL — AB (ref 4.0–10.5)

## 2015-12-20 LAB — URINALYSIS, ROUTINE W REFLEX MICROSCOPIC
Bilirubin Urine: NEGATIVE
Glucose, UA: NEGATIVE mg/dL
KETONES UR: 40 mg/dL — AB
NITRITE: POSITIVE — AB
PH: 5.5 (ref 5.0–8.0)
Protein, ur: NEGATIVE mg/dL
Specific Gravity, Urine: 1.019 (ref 1.005–1.030)

## 2015-12-20 LAB — URINE MICROSCOPIC-ADD ON

## 2015-12-20 MED ORDER — OXYCODONE-ACETAMINOPHEN 5-325 MG PO TABS
1.0000 | ORAL_TABLET | Freq: Three times a day (TID) | ORAL | Status: DC | PRN
  Administered 2015-12-20 – 2015-12-23 (×6): 1 via ORAL
  Filled 2015-12-20 (×8): qty 1

## 2015-12-20 MED ORDER — DM-GUAIFENESIN ER 30-600 MG PO TB12
1.0000 | ORAL_TABLET | Freq: Two times a day (BID) | ORAL | Status: DC | PRN
  Administered 2015-12-21 – 2015-12-22 (×2): 1 via ORAL
  Filled 2015-12-20 (×3): qty 1

## 2015-12-20 MED ORDER — ACETAMINOPHEN 325 MG PO TABS: 650.0000 mg | ORAL_TABLET | Freq: Four times a day (QID) | ORAL | Status: DC | PRN

## 2015-12-20 MED ORDER — NICOTINE 14 MG/24HR TD PT24
14.0000 mg | MEDICATED_PATCH | Freq: Every day | TRANSDERMAL | Status: DC
  Administered 2015-12-20 – 2015-12-22 (×3): 14 mg via TRANSDERMAL
  Filled 2015-12-20 (×4): qty 1

## 2015-12-20 MED ORDER — IPRATROPIUM-ALBUTEROL 0.5-2.5 (3) MG/3ML IN SOLN
3.0000 mL | Freq: Four times a day (QID) | RESPIRATORY_TRACT | Status: DC
  Administered 2015-12-20: 3 mL via RESPIRATORY_TRACT
  Filled 2015-12-20: qty 3

## 2015-12-20 MED ORDER — ACETAMINOPHEN 650 MG RE SUPP: 650.0000 mg | Freq: Four times a day (QID) | RECTAL | Status: DC | PRN

## 2015-12-20 MED ORDER — OXYCODONE-ACETAMINOPHEN 10-325 MG PO TABS: 1.0000 | ORAL_TABLET | Freq: Three times a day (TID) | ORAL | Status: DC | PRN

## 2015-12-20 MED ORDER — ALBUTEROL SULFATE (2.5 MG/3ML) 0.083% IN NEBU
2.5000 mg | INHALATION_SOLUTION | RESPIRATORY_TRACT | Status: DC | PRN
  Administered 2015-12-22: 2.5 mg via RESPIRATORY_TRACT
  Filled 2015-12-20: qty 3

## 2015-12-20 MED ORDER — ADULT MULTIVITAMIN W/MINERALS CH
1.0000 | ORAL_TABLET | Freq: Every day | ORAL | Status: DC
  Administered 2015-12-21 – 2015-12-23 (×3): 1 via ORAL
  Filled 2015-12-20 (×4): qty 1

## 2015-12-20 MED ORDER — ONDANSETRON HCL 4 MG PO TABS: 4.0000 mg | ORAL_TABLET | Freq: Four times a day (QID) | ORAL | Status: DC | PRN

## 2015-12-20 MED ORDER — LOSARTAN POTASSIUM 25 MG PO TABS
25.0000 mg | ORAL_TABLET | Freq: Every day | ORAL | Status: DC
  Administered 2015-12-21 – 2015-12-23 (×3): 25 mg via ORAL
  Filled 2015-12-20 (×3): qty 1

## 2015-12-20 MED ORDER — CALCIUM CARBONATE-VITAMIN D 500-200 MG-UNIT PO TABS
1.0000 | ORAL_TABLET | Freq: Every day | ORAL | Status: DC
  Administered 2015-12-21 – 2015-12-23 (×3): 1 via ORAL
  Filled 2015-12-20 (×3): qty 1

## 2015-12-20 MED ORDER — GLYCERIN (LAXATIVE) 2 G RE SUPP
1.0000 | Freq: Once | RECTAL | Status: DC | PRN
  Filled 2015-12-20: qty 1

## 2015-12-20 MED ORDER — ONDANSETRON HCL 4 MG/2ML IJ SOLN: 4.0000 mg | Freq: Three times a day (TID) | INTRAMUSCULAR | Status: DC | PRN

## 2015-12-20 MED ORDER — ENOXAPARIN SODIUM 40 MG/0.4ML ~~LOC~~ SOLN
40.0000 mg | Freq: Every day | SUBCUTANEOUS | Status: DC
  Administered 2015-12-20: 40 mg via SUBCUTANEOUS
  Filled 2015-12-20 (×2): qty 0.4

## 2015-12-20 MED ORDER — ENSURE ENLIVE PO LIQD
237.0000 mL | Freq: Two times a day (BID) | ORAL | Status: DC
  Administered 2015-12-21 – 2015-12-23 (×4): 237 mL via ORAL

## 2015-12-20 MED ORDER — MOMETASONE FURO-FORMOTEROL FUM 200-5 MCG/ACT IN AERO
2.0000 | INHALATION_SPRAY | Freq: Two times a day (BID) | RESPIRATORY_TRACT | Status: DC
  Administered 2015-12-21 – 2015-12-23 (×5): 2 via RESPIRATORY_TRACT
  Filled 2015-12-20: qty 8.8

## 2015-12-20 MED ORDER — MORPHINE SULFATE (PF) 2 MG/ML IV SOLN
0.5000 mg | INTRAVENOUS | Status: DC | PRN
  Administered 2015-12-22: 0.5 mg via INTRAVENOUS
  Filled 2015-12-20: qty 1

## 2015-12-20 MED ORDER — IPRATROPIUM-ALBUTEROL 0.5-2.5 (3) MG/3ML IN SOLN
3.0000 mL | Freq: Four times a day (QID) | RESPIRATORY_TRACT | Status: DC
  Administered 2015-12-21 – 2015-12-23 (×9): 3 mL via RESPIRATORY_TRACT
  Filled 2015-12-20 (×9): qty 3

## 2015-12-20 MED ORDER — OXYCODONE HCL 5 MG PO TABS
5.0000 mg | ORAL_TABLET | Freq: Three times a day (TID) | ORAL | Status: DC | PRN
Start: 2015-12-20 — End: 2015-12-23
  Administered 2015-12-20 – 2015-12-23 (×7): 5 mg via ORAL
  Filled 2015-12-20 (×9): qty 1

## 2015-12-20 MED ORDER — BISACODYL 5 MG PO TBEC
5.0000 mg | DELAYED_RELEASE_TABLET | Freq: Every day | ORAL | Status: DC | PRN
  Administered 2015-12-22 (×2): 5 mg via ORAL
  Filled 2015-12-20: qty 1
  Filled 2015-12-20 (×2): qty 2

## 2015-12-20 MED ORDER — OXYCODONE-ACETAMINOPHEN 5-325 MG PO TABS
1.0000 | ORAL_TABLET | Freq: Once | ORAL | Status: AC
  Administered 2015-12-20: 1 via ORAL
  Filled 2015-12-20: qty 1

## 2015-12-20 NOTE — ED Notes (Signed)
Attempted report x1. 

## 2015-12-20 NOTE — ED Notes (Signed)
Pt was able to ambulate, and is now eating a ham sandwich, and coke

## 2015-12-20 NOTE — ED Notes (Signed)
Per EMS, pt from home, reports fall yesterday, with R hip pain.  Fell in her kitchen-mechanical fall.  Only c/o pain when she moves her R limb.  Per EMS, no shortening or deformity noted at this time.  Denies LOC, or blood thinners.  Pedal pulses noted.  Pt is A&O x 4.  Pt lives with her husband who is legally blind which pt reports has been flushing her percocets in the toilet for her cancer because he does not want her to get addicted to them.  Daughter is POA.

## 2015-12-20 NOTE — ED Notes (Signed)
Bed: WA20 Expected date:  Expected time:  Means of arrival:  Comments: 29F/fall/hip pain

## 2015-12-20 NOTE — ED Notes (Addendum)
Attempted report x1, RN advised there is a patient in 1615, informed secretary she is calling bed placement.

## 2015-12-20 NOTE — ED Notes (Signed)
Report called to floor, patient ready for transport.
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
## 2015-12-20 NOTE — ED Notes (Signed)
Patient returned from xray.

## 2015-12-20 NOTE — ED Notes (Signed)
NT and this RN assisted patient to bedpan

## 2015-12-20 NOTE — ED Notes (Signed)
MD at bedside. 
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
## 2015-12-20 NOTE — ED Notes (Signed)
Pt was unable to ambulate in the hallway, pt sts her (R) hurts too much to even move

## 2015-12-21 ENCOUNTER — Encounter (HOSPITAL_COMMUNITY): Payer: Self-pay | Admitting: *Deleted

## 2015-12-21 DIAGNOSIS — I10 Essential (primary) hypertension: Secondary | ICD-10-CM

## 2015-12-21 DIAGNOSIS — M25551 Pain in right hip: Secondary | ICD-10-CM

## 2015-12-21 DIAGNOSIS — J449 Chronic obstructive pulmonary disease, unspecified: Secondary | ICD-10-CM | POA: Diagnosis not present

## 2015-12-21 DIAGNOSIS — M546 Pain in thoracic spine: Secondary | ICD-10-CM

## 2015-12-21 DIAGNOSIS — E43 Unspecified severe protein-calorie malnutrition: Secondary | ICD-10-CM

## 2015-12-21 DIAGNOSIS — K5900 Constipation, unspecified: Secondary | ICD-10-CM | POA: Diagnosis not present

## 2015-12-21 DIAGNOSIS — C3492 Malignant neoplasm of unspecified part of left bronchus or lung: Secondary | ICD-10-CM

## 2015-12-21 DIAGNOSIS — N3000 Acute cystitis without hematuria: Secondary | ICD-10-CM

## 2015-12-21 DIAGNOSIS — M549 Dorsalgia, unspecified: Secondary | ICD-10-CM | POA: Diagnosis not present

## 2015-12-21 DIAGNOSIS — W19XXXA Unspecified fall, initial encounter: Secondary | ICD-10-CM

## 2015-12-21 DIAGNOSIS — Z720 Tobacco use: Secondary | ICD-10-CM

## 2015-12-21 DIAGNOSIS — N390 Urinary tract infection, site not specified: Secondary | ICD-10-CM | POA: Diagnosis present

## 2015-12-21 LAB — CBC
HCT: 25.6 % — ABNORMAL LOW (ref 36.0–46.0)
Hemoglobin: 8.2 g/dL — ABNORMAL LOW (ref 12.0–15.0)
MCH: 33.7 pg (ref 26.0–34.0)
MCHC: 32 g/dL (ref 30.0–36.0)
MCV: 105.3 fL — ABNORMAL HIGH (ref 78.0–100.0)
PLATELETS: 445 10*3/uL — AB (ref 150–400)
RBC: 2.43 MIL/uL — AB (ref 3.87–5.11)
RDW: 15.7 % — AB (ref 11.5–15.5)
WBC: 13.1 10*3/uL — AB (ref 4.0–10.5)

## 2015-12-21 LAB — BASIC METABOLIC PANEL
Anion gap: 5 (ref 5–15)
Anion gap: 5 (ref 5–15)
BUN: 15 mg/dL (ref 6–20)
BUN: 17 mg/dL (ref 6–20)
CALCIUM: 9.1 mg/dL (ref 8.9–10.3)
CALCIUM: 9.3 mg/dL (ref 8.9–10.3)
CO2: 37 mmol/L — ABNORMAL HIGH (ref 22–32)
CO2: 38 mmol/L — ABNORMAL HIGH (ref 22–32)
CREATININE: 0.42 mg/dL — AB (ref 0.44–1.00)
CREATININE: 0.43 mg/dL — AB (ref 0.44–1.00)
Chloride: 92 mmol/L — ABNORMAL LOW (ref 101–111)
Chloride: 92 mmol/L — ABNORMAL LOW (ref 101–111)
GFR calc non Af Amer: >60 mL/min (ref >60)
Glucose, Bld: 103 mg/dL — ABNORMAL HIGH (ref 65–99)
Glucose, Bld: 167 mg/dL — ABNORMAL HIGH (ref 65–99)
Potassium: 4.1 mmol/L (ref 3.5–5.1)
Potassium: 4.2 mmol/L (ref 3.5–5.1)
SODIUM: 134 mmol/L — AB (ref 135–145)
SODIUM: 135 mmol/L (ref 135–145)

## 2015-12-21 MED ORDER — SODIUM CHLORIDE 0.9 % IV SOLN
INTRAVENOUS | Status: DC
  Administered 2015-12-21 (×2): via INTRAVENOUS

## 2015-12-21 MED ORDER — ENOXAPARIN SODIUM 30 MG/0.3ML ~~LOC~~ SOLN
30.0000 mg | Freq: Every day | SUBCUTANEOUS | Status: DC
  Administered 2015-12-21 – 2015-12-22 (×2): 30 mg via SUBCUTANEOUS
  Filled 2015-12-21 (×2): qty 0.3

## 2015-12-21 MED ORDER — DEXTROSE 5 % IV SOLN
1.0000 g | INTRAVENOUS | Status: DC
  Administered 2015-12-21 – 2015-12-23 (×3): 1 g via INTRAVENOUS
  Filled 2015-12-21 (×3): qty 10

## 2015-12-21 NOTE — NC FL2 (Signed)
[place] MEDICAID FL2 LEVEL OF CARE SCREENING TOOL     IDENTIFICATION  Patient Name: [name] Birthdate: August 09, 1934 Sex: female Admission Date (Current Location): 12/20/2015  [facility] and [place] Number:  Herbalist and Address:  [facility],  [place] [address] [place]      Provider Number: [id number]  Attending Physician Name and Address:  Verlee Monte, MD  Relative Name and Phone Number:       Current Level of Care: SNF Recommended Level of Care: [place] Prior Approval Number:    Date Approved/Denied:   PASRR Number: [id number]  Discharge Plan: SNF    Current Diagnoses: Patient Active Problem List   Diagnosis Date Noted  . UTI (urinary tract infection) 12/21/2015  . Tobacco abuse 12/20/2015  . Fall 12/20/2015  . Back pain 12/20/2015  . Right hip pain 12/20/2015  . Weakness generalized 05/10/2014  . DNR (do not resuscitate) discussion 05/10/2014  . Acute respiratory failure ([place]) 03/16/2014  . Dyspnea 03/15/2014  . COPD with acute exacerbation ([place]) 03/15/2014  . Protein-calorie malnutrition, severe ([place]) 03/08/2014  . Hip fracture ([place]) 03/07/2014  . Local recurrence of left lung cancer ([place]) 11/27/2013  . Lung cancer, lower lobe ([place]) 12/31/2012  . Lung mass 12/12/2012  . COPD (chronic obstructive pulmonary disease) ([place]) 12/12/2012  . HTN (hypertension) 12/12/2012    Orientation RESPIRATION BLADDER Height & Weight     Self, Time, Situation, Place  O2 (4L) Continent Weight: 88 lb (39.917 kg) Height:  '5\' 4"'$  (162.6 cm)  BEHAVIORAL SYMPTOMS/MOOD NEUROLOGICAL BOWEL NUTRITION STATUS  Other (Comment) (no behaviors)   Continent Diet (Heart)  AMBULATORY STATUS COMMUNICATION OF NEEDS Skin   Extensive Assist Verbally Normal                       Personal Care Assistance Level of Assistance  Bathing, Feeding, Dressing Bathing Assistance: Maximum assistance Feeding assistance: Independent Dressing  Assistance: Maximum assistance     Functional Limitations Info  Sight, Hearing, Speech Sight Info: Adequate Hearing Info: Impaired Speech Info: Adequate    SPECIAL CARE FACTORS FREQUENCY  PT (By licensed PT), OT (By licensed OT)     PT Frequency: 5 OT Frequency: 5            Contractures Contractures Info: Not present    Additional Factors Info  Code Status, Allergies Code Status Info: DNR Allergies Info: Lohexol           Current Medications (12/21/2015):  This is the current hospital active medication list Current Facility-Administered Medications  Medication Dose Route Frequency Provider Last Rate Last Dose  . 0.9 %  sodium chloride infusion   Intravenous Continuous Ivor Costa, MD 100 mL/hr at 12/21/15 0128    . acetaminophen (TYLENOL) tablet 650 mg  650 mg Oral Q6H PRN Ivor Costa, MD       Or  . acetaminophen (TYLENOL) suppository 650 mg  650 mg Rectal Q6H PRN Ivor Costa, MD      . albuterol (PROVENTIL) (2.5 MG/3ML) 0.083% nebulizer solution 2.5 mg  2.5 mg Nebulization Q4H PRN Ivor Costa, MD      . bisacodyl (DULCOLAX) EC tablet 5-10 mg  5-10 mg Oral Daily PRN Ivor Costa, MD      . calcium-vitamin D (OSCAL WITH D) 500-200 MG-UNIT per tablet 1 tablet  1 tablet Oral Q breakfast Ivor Costa, MD   1 tablet at 12/21/15 [phone]  . cefTRIAXone (ROCEPHIN) 1 g in  dextrose 5 % 50 mL IVPB  1 g Intravenous Q24H Ivor Costa, MD   1 g at 12/21/15 0128  . dextromethorphan-guaiFENesin (MUCINEX DM) 30-600 MG per 12 hr tablet 1 tablet  1 tablet Oral BID PRN Ivor Costa, MD   1 tablet at 12/21/15 0945  . enoxaparin (LOVENOX) injection 30 mg  30 mg Subcutaneous QHS Leann T Poindexter, RPH      . feeding supplement (ENSURE ENLIVE) (ENSURE ENLIVE) liquid 237 mL  237 mL Oral BID BM Ivor Costa, MD   237 mL at 12/21/15 0714  . glycerin adult suppository 1 suppository  1 suppository Rectal Once PRN Ivor Costa, MD      . ipratropium-albuterol (DUONEB) 0.5-2.5 (3) MG/3ML nebulizer solution 3 mL  3 mL Inhalation  QID Ivor Costa, MD   3 mL at 12/21/15 1234  . losartan (COZAAR) tablet 25 mg  25 mg Oral Daily Ivor Costa, MD   25 mg at 12/21/15 0945  . mometasone-formoterol (DULERA) 200-5 MCG/ACT inhaler 2 puff  2 puff Inhalation BID Ivor Costa, MD   2 puff at 12/21/15 0849  . morphine 2 MG/ML injection 0.5 mg  0.5 mg Intravenous Q4H PRN Ivor Costa, MD      . multivitamin with minerals tablet 1 tablet  1 tablet Oral Daily Ivor Costa, MD   1 tablet at 12/21/15 0945  . nicotine (NICODERM CQ - dosed in mg/24 hours) patch 14 mg  14 mg Transdermal Daily Ivor Costa, MD   14 mg at 12/21/15 0945  . ondansetron (ZOFRAN) injection 4 mg  4 mg Intravenous Q8H PRN Ivor Costa, MD      . ondansetron [facility]) tablet 4 mg  4 mg Oral Q6H PRN Ivor Costa, MD      . oxyCODONE-acetaminophen (PERCOCET/ROXICET) 5-325 MG per tablet 1 tablet  1 tablet Oral TID PRN Forde Dandy, MD   1 tablet at 12/21/15 5400   And  . oxyCODONE (Oxy IR/ROXICODONE) immediate release tablet 5 mg  5 mg Oral TID PRN Forde Dandy, MD   5 mg at 12/21/15 8676     Discharge Medications: Please see discharge summary for a list of discharge medications.  Relevant Imaging Results:  Relevant Lab Results:   Additional Information ss # [id number]  Standley Brooking, LCSW

## 2015-12-21 NOTE — Care Management Obs Status (Signed)
[place] NOTIFICATION   Patient Details  Name: ZAMYIA GOWELL MRN: [id number] Date of Birth: 10-22-1934   Medicare Observation Status Notification Given:    MOON given, explained, and signed by pt; pt given copy; original given to CMA to be scanned into medical record.  Dellie Catholic, RN 12/21/2015, 12:45 PM

## 2015-12-21 NOTE — Progress Notes (Signed)
PROGRESS NOTE  [name]  YKZ:[id number] DOB: [birth year] DOA: 12/20/2015 PCP: [name], MD Outpatient Specialists:  Subjective: Patient seen while she was getting breathing treatment, please to be very frail. Denies any complaints. Uses wheelchair and walker at home, cares for her legally blind husband and she is on 4 L of oxygen.  Brief Narrative:  [name] is a 81 y.o. female with medical history significant of Stage IV NSCLC (s/p of radiation), hypertension, COPD on 4 L oxygen at home, tobacco abuse, who presents with fall and tenderness over the left mid back and right hip. Pt states that she tripped her steps and fell when she was in her kitchen yesterday. She injured her right hip and left mid back, causing severe pain. She described the pain over right hip and mid back as constant, 9/10 in severity, nonradiating. Denies any head or neck injury. No headache or neck pain. No LOC. Patient does not have numbness in her extremities. Pt states that she has to scoot to couch and pull herself on the couch. She is able to ambulate in her home with walker, but primarily in wheelchair. Her husband is legally blinded, and cannot take care of patient at home. She has mild chronic cough and mild shortness of breath, which is at the baseline. No nausea, vomiting, abdominal pain. She said that she has loose stool due to taking laxatives for constipaiton. Patient states that she has mild dysuria. No unilateral weakness, vision change or hearing loss. No urinary incontinence or loss of control of bowel movement.   Assessment & Plan:   Principal Problem:   Fall Active Problems:   COPD (chronic obstructive pulmonary disease) (HCC)   HTN (hypertension)   Local recurrence of left lung cancer (HCC)   Protein-calorie malnutrition, severe (HCC)   Tobacco abuse   Back pain   Right hip pain   UTI (urinary tract infection)   Fall -No loss of consciousness reported, seems have had a mechanic  fall.  -Now has severe pain over right hip and left mid back. No acute bony fracture by images.  -will place on med-surg bed for obs -pain control: When necessary Percocet and the low-dose morphine for pain -When necessary Zofran for nausea. -Consult to case manager for home health needs. -PT/OT, awaiting their evaluation, likely it will be skilled nursing facility.  UTI:  -Patient has dysuria and positive urinalysis, consistent with UTI. -IV Rocephin -Follow-up urine culture and blood culture.  COPD with chronic respiratory failure with hypoxia -Stable with no decompensation, on 4 L of oxygen at home. -When necessary Mucinex for cough -Dulera inhaler -When necessary albuterol nebulizers -Scheduled DuoNeb nebulizers  HTN: Blood pressure 127/54 -Continue Zocor,   Local recurrence of left lung cancer [facility]): Initially diagnosed as stage IB non-small cell lung cancer, squamous cell carcinoma, s/p of curative stereotactic body radiotherapy and s/p of palliative radiotherapy to the subpleural left lower lobe lung mass. Patient is followed up by Dr. Julien Nordmann. Currently is and observation. -Follow-up with Dr. Julien Nordmann  Protein-calorie malnutrition, severe [facility]): -ensure  Tobacco abuse -nicotine patch  DVT prophylaxis: Lovenox Code Status: DNR Family Communication:  Disposition Plan:  Diet: Diet Heart Room service appropriate?: Yes; Fluid consistency:: Thin  Consultants:   None  Procedures:   None  Antimicrobials:   None  Objective: Filed Vitals:   12/20/15 2215 12/20/15 2315 12/21/15 0553 12/21/15 0849  BP: 130/45 116/55 142/56   Pulse: 97 97 102   Temp: 97.9 F (36.6 C)  97.7 F (36.5 C) 98.2 F (36.8 C)   TempSrc: Oral Oral Oral   Resp: '21 20 20   '$ Height:      Weight:      SpO2: 100% 99% 97% 92%    Intake/Output Summary (Last 24 hours) at 12/21/15 1050 Last data filed at 12/21/15 1005  Gross per 24 hour  Intake 608.33 ml  Output      0 ml  Net 608.33 ml    Filed Weights   12/20/15 1740  Weight: 39.917 kg (88 lb)    Examination: General exam: Appears calm and comfortable  Respiratory system: Clear to auscultation. Respiratory effort normal. Cardiovascular system: S1 & S2 heard, RRR. No JVD, murmurs, rubs, gallops or clicks. No pedal edema. Gastrointestinal system: Abdomen is nondistended, soft and nontender. No organomegaly or masses felt. Normal bowel sounds heard. Central nervous system: Alert and oriented. No focal neurological deficits. Extremities: Symmetric 5 x 5 power. Skin: No rashes, lesions or ulcers Psychiatry: Judgement and insight appear normal. Mood & affect appropriate.   Data Reviewed: I have personally reviewed following labs and imaging studies  CBC:  Recent Labs Lab 12/20/15 2216 12/21/15 0140  WBC 15.2* 13.1*  HGB 9.6* 8.2*  HCT 31.6* 25.6*  MCV 107.1* 105.3*  PLT 495* 810*   Basic Metabolic Panel:  Recent Labs Lab 12/20/15 2351 12/21/15 0140  NA 135 134*  K 4.2 4.1  CL 92* 92*  CO2 38* 37*  GLUCOSE 167* 103*  BUN 15 17  CREATININE 0.43* 0.42*  CALCIUM 9.3 9.1   GFR: Estimated Creatinine Clearance: 36.5 mL/min (by C-G formula based on Cr of 0.42). Liver Function Tests: No results for input(s): AST, ALT, ALKPHOS, BILITOT, PROT, ALBUMIN in the last 168 hours. No results for input(s): LIPASE, AMYLASE in the last 168 hours. No results for input(s): AMMONIA in the last 168 hours. Coagulation Profile: No results for input(s): INR, PROTIME in the last 168 hours. Cardiac Enzymes: No results for input(s): CKTOTAL, CKMB, CKMBINDEX, TROPONINI in the last 168 hours. BNP (last 3 results) No results for input(s): PROBNP in the last 8760 hours. HbA1C: No results for input(s): HGBA1C in the last 72 hours. CBG: No results for input(s): GLUCAP in the last 168 hours. Lipid Profile: No results for input(s): CHOL, HDL, LDLCALC, TRIG, CHOLHDL, LDLDIRECT in the last 72 hours. Thyroid Function Tests: No  results for input(s): TSH, T4TOTAL, FREET4, T3FREE, THYROIDAB in the last 72 hours. Anemia Panel: No results for input(s): VITAMINB12, FOLATE, FERRITIN, TIBC, IRON, RETICCTPCT in the last 72 hours. Urine analysis:    Component Value Date/Time   COLORURINE YELLOW 12/20/2015 2205   APPEARANCEUR CLOUDY* 12/20/2015 2205   LABSPEC 1.019 12/20/2015 2205   PHURINE 5.5 12/20/2015 2205   GLUCOSEU NEGATIVE 12/20/2015 2205   HGBUR SMALL* 12/20/2015 2205   BILIRUBINUR NEGATIVE 12/20/2015 2205   KETONESUR 40* 12/20/2015 2205   PROTEINUR NEGATIVE 12/20/2015 2205   UROBILINOGEN 0.2 03/07/2014 1122   NITRITE POSITIVE* 12/20/2015 2205   LEUKOCYTESUR MODERATE* 12/20/2015 2205   Sepsis Labs: '@LABRCNTIP'$ (procalcitonin:4,lacticidven:4)  )No results found for this or any previous visit (from the past 240 hour(s)).   Invalid input(s): PROCALCITONIN, LACTICACIDVEN   Radiology Studies: Dg Knee 2 Views Right  12/20/2015  CLINICAL DATA:  Right knee pain since fall yesterday. Unable to bear weight since the fall. EXAM: RIGHT KNEE - 1-2 VIEW COMPARISON:  Radiographs 03/07/2014 FINDINGS: No acute fracture or subluxation. Alignment joint spaces are maintained. No knee joint effusion. The bones are under  mineralized. Remote midshaft tibia and fibular fractures are partially included. IMPRESSION: No acute fracture or subluxation of the right knee. Electronically Signed   By: Jeb Levering M.D.   On: 12/20/2015 21:06   Ct Thoracic Spine Wo Contrast  12/20/2015  CLINICAL DATA:  Mechanical fall in kitchen at home yesterday. RIGHT hip pain. EXAM: CT THORACIC AND LUMBAR SPINE WITHOUT CONTRAST TECHNIQUE: Multidetector CT imaging of the thoracic and lumbar spine was performed without contrast. Multiplanar CT image reconstructions were also generated. COMPARISON:  MRI of the lumbar spine February 27th 2017 and CT chest August 01, 2015 FINDINGS: CT THORACIC SPINE FINDINGS ALIGNMENT: Thoracic vertebral bodies are in  alignment. Maintained thoracic lordosis. OSSEOUS STRUCTURES: Mild wedging vertebral body T1 with less than 25% height loss, endplate sclerosis. Moderate to severe chronic T12 compression fracture, similar in appearance to prior MRI. Osteopenia. Intervertebral disc heights generally preserved. Healing tenth through twelfth lateral rib fractures. Dense rounded consolidation measuring at least 3.9 cm LEFT lower lobe with increasing small to moderate pleural effusion. Severe centrilobular emphysema. SOFT TISSUES: Included prevertebral and paraspinal soft tissues are normal. CT LUMBAR SPINE FINDINGS SEGMENTATION: For the purposes of this report the last well-formed intervertebral disc space will be described as L5-S1. ALIGNMENT: Lumbar vertebral bodies in alignment, maintenance of the lumbar lordosis. OSSEOUS STRUCTURES: Lumbar vertebral bodies and posterior elements are intact. Mild L2-3 disc height loss compatible with early degenerative disc. No destructive bony lesions. Osteopenia. Moderate lower lumbar facet arthropathy. No osseous canal stenosis. Mild LEFT foraminal narrowing. Symmetric linear densities in the sacrum. SOFT TISSUES: Included prevertebral and paraspinal soft tissues are nonsuspicious. Partially imaged cyst in RIGHT renal fossa, RIGHT kidney not visualized on the CT. IMPRESSION: CT THORACIC SPINE IMPRESSION No acute thoracic fracture or malalignment. Mild T1 and moderate to severe T12 old compression fractures. Masslike consolidation LEFT lower lobe with increasing small to moderate pleural effusion concerning for neoplasm. CT LUMBAR SPINE IMPRESSION No acute lumbar fracture or malalignment. Symmetric linear densities in the sacrum, equivocal for stress fractures. Recommend correlation with point tenderness. Electronically Signed   By: Elon Alas M.D.   On: 12/20/2015 19:19   Ct Lumbar Spine Wo Contrast  12/20/2015  CLINICAL DATA:  Mechanical fall in kitchen at home yesterday. RIGHT hip  pain. EXAM: CT THORACIC AND LUMBAR SPINE WITHOUT CONTRAST TECHNIQUE: Multidetector CT imaging of the thoracic and lumbar spine was performed without contrast. Multiplanar CT image reconstructions were also generated. COMPARISON:  MRI of the lumbar spine February 27th 2017 and CT chest August 01, 2015 FINDINGS: CT THORACIC SPINE FINDINGS ALIGNMENT: Thoracic vertebral bodies are in alignment. Maintained thoracic lordosis. OSSEOUS STRUCTURES: Mild wedging vertebral body T1 with less than 25% height loss, endplate sclerosis. Moderate to severe chronic T12 compression fracture, similar in appearance to prior MRI. Osteopenia. Intervertebral disc heights generally preserved. Healing tenth through twelfth lateral rib fractures. Dense rounded consolidation measuring at least 3.9 cm LEFT lower lobe with increasing small to moderate pleural effusion. Severe centrilobular emphysema. SOFT TISSUES: Included prevertebral and paraspinal soft tissues are normal. CT LUMBAR SPINE FINDINGS SEGMENTATION: For the purposes of this report the last well-formed intervertebral disc space will be described as L5-S1. ALIGNMENT: Lumbar vertebral bodies in alignment, maintenance of the lumbar lordosis. OSSEOUS STRUCTURES: Lumbar vertebral bodies and posterior elements are intact. Mild L2-3 disc height loss compatible with early degenerative disc. No destructive bony lesions. Osteopenia. Moderate lower lumbar facet arthropathy. No osseous canal stenosis. Mild LEFT foraminal narrowing. Symmetric linear densities in the sacrum. SOFT  TISSUES: Included prevertebral and paraspinal soft tissues are nonsuspicious. Partially imaged cyst in RIGHT renal fossa, RIGHT kidney not visualized on the CT. IMPRESSION: CT THORACIC SPINE IMPRESSION No acute thoracic fracture or malalignment. Mild T1 and moderate to severe T12 old compression fractures. Masslike consolidation LEFT lower lobe with increasing small to moderate pleural effusion concerning for neoplasm.  CT LUMBAR SPINE IMPRESSION No acute lumbar fracture or malalignment. Symmetric linear densities in the sacrum, equivocal for stress fractures. Recommend correlation with point tenderness. Electronically Signed   By: Elon Alas M.D.   On: 12/20/2015 19:19   Dg Hip Unilat With Pelvis 2-3 Views Right  12/20/2015  CLINICAL DATA:  Pain following fall 1 day prior EXAM: DG HIP (WITH OR WITHOUT PELVIS) 2-3V RIGHT COMPARISON:  March 07, 2014 FINDINGS: Frontal pelvis as well as frontal and lateral right hip images were obtained. There is a total hip prosthesis on the right which appears well seated. There are foci of myositis ossificans superior and inferior to the right hip joint. There old healed fractures of the right ischium and right superior pubic ramus. There is no evident acute fracture or dislocation. There is slight narrowing of the left hip joint. No erosive changes. IMPRESSION: Total hip prosthesis on the right with prosthetic components well-seated. Old trauma involving the right superior pubic ramus and ischium. No acute fracture or dislocation evident. Myositis ossifications noted in the right hip joint region superiorly and inferiorly. Electronically Signed   By: Lowella Grip III M.D.   On: 12/20/2015 18:32        Scheduled Meds: . calcium-vitamin D  1 tablet Oral Q breakfast  . cefTRIAXone (ROCEPHIN)  IV  1 g Intravenous Q24H  . enoxaparin (LOVENOX) injection  30 mg Subcutaneous QHS  . feeding supplement (ENSURE ENLIVE)  237 mL Oral BID BM  . ipratropium-albuterol  3 mL Inhalation QID  . losartan  25 mg Oral Daily  . mometasone-formoterol  2 puff Inhalation BID  . multivitamin with minerals  1 tablet Oral Daily  . nicotine  14 mg Transdermal Daily   Continuous Infusions: . sodium chloride 100 mL/hr at 12/21/15 0128        Time spent: 35 minutes    Vernia Teem A, MD [facility] Pager [phone]  If 7PM-7AM, please contact  night-coverage [email or web address] Password [email or web address] 12/21/2015, 10:50 AM

## 2015-12-21 NOTE — Progress Notes (Signed)
CSW assisting with d/c planning. PT has recommended SNF at d/c. Pt is in agreement with this plan. SNF search initiated and bed offers pending. Pt is interested in [facility] [place] [facility] CSW has contacted SNF and a decision is pending.  Roselyn Reef Laker Thompson LCSW [phone]

## 2015-12-21 NOTE — Clinical Social Work Placement (Signed)
Patient has a bed at [facility]. CSW has completed FL2 & will continue to follow and assist with discharge when ready.    Raynaldo Opitz, [facility] Clinical Social Worker cell #: [phone]     CLINICAL SOCIAL WORK PLACEMENT  NOTE  Date:  12/21/2015  Patient Details  Name: [name] MRN: [id number] Date of Birth: December 10, 1934  Clinical Social Work is seeking post-discharge placement for this patient at the [place] level of care (*CSW will initial, date and re-position this form in  chart as items are completed):  Yes   Patient/family provided with [place] Work Department's list of facilities offering this level of care within the geographic area requested by the patient (or if unable, by the patient's family).  Yes   Patient/family informed of their freedom to choose among providers that offer the needed level of care, that participate in Medicare, Medicaid or managed care program needed by the patient, have an available bed and are willing to accept the patient.  Yes   Patient/family informed of 's ownership interest in [facility] and [facility], as well as of the fact that they are under no obligation to receive care at these facilities.  PASRR submitted to EDS on       PASRR number received on       Existing PASRR number confirmed on 12/21/15     FL2 transmitted to all facilities in geographic area requested by pt/family on 12/21/15     FL2 transmitted to all facilities within larger geographic area on       Patient informed that his/her managed care company has contracts with or will negotiate with certain facilities, including the following:        Yes   Patient/family informed of bed offers received.  Patient chooses bed at [place] [place]     Physician recommends and patient chooses bed at      Patient to be transferred to [place], [place] on   .  Patient to be transferred to facility by       Patient family notified on   of transfer.  Name of family member notified:        PHYSICIAN       Additional Comment:    _______________________________________________ Standley Brooking, LCSW 12/21/2015, 3:37 PM

## 2015-12-21 NOTE — Clinical Social Work Note (Signed)
Clinical Social Work Assessment  Patient Details  Name: SHAKURA COWING MRN: [id number] Date of Birth: 1935-02-03  Date of referral:  12/21/15               Reason for consult:  Discharge Planning                Permission sought to share information with:  Chartered certified accountant granted to share information::  Yes, Verbal Permission Granted  Name::        Agency::     Relationship::     Contact Information:     Housing/Transportation Living arrangements for the past 2 months:  Single Family Home Source of Information:  Patient Patient Interpreter Needed:  None Criminal Activity/Legal Involvement Pertinent to Current Situation/Hospitalization:  No - Comment as needed Significant Relationships:  Adult Children Lives with:  Spouse Do you feel safe going back to the place where you live?  Yes Need for family participation in patient care:  Yes (Comment)  Care giving concerns:  Limited support at home. Spouse is legally blind.   Social Worker assessment / plan: Pt hospitalized on 12/20/15 with hip pain due to fall and UTI. No fx's noted. Pt is being treated for UTI. Pt is under observation status. PT eval / recommendations are pending. CSW met with pt to assist with d/c planning. Pt doesn't feel spouse can care for her at home at this time. Pt reports that she has been to rehab in the past and would consider this option if recommended. Pt reports that she would prefer we contact her daughter, Delila Spence [phone], for assistance with dc planning. CSW will meet again with pt following PT eval to continue assisting with d/c planning. Employment status:  Retired Nurse, adult PT Recommendations:  Not assessed at this time Information / Referral to community resources:     Patient/Family's Response to care: Disposition to be determined.  Patient/Family's Understanding of and Emotional Response to Diagnosis, Current Treatment, and Prognosis:   " I'm in pain when I move and in pain when I don't move. " NSG is aware and have provided pain medication. Pt has been to [facility] ( [facility] back in 2015 for [facility] Rehab. " They treated me really good over there. " Pt is open to rehab placement if recommended.  Emotional Assessment Appearance:  Appears stated age Attitude/Demeanor/Rapport:  Other (cooperative) Affect (typically observed):  Appropriate, Pleasant, Calm Orientation:  Oriented to Self, Oriented to Place, Oriented to  Time, Oriented to Situation Alcohol / Substance use:  Not Applicable Psych involvement (Current and /or in the community):  No (Comment)  Discharge Needs  Concerns to be addressed:  Discharge Planning Concerns Readmission within the last 30 days:  No Current discharge risk:  None Barriers to Discharge:  No Barriers Identified   Luretha Rued, [place] 12/21/2015, 9:04 AM

## 2015-12-21 NOTE — Clinical Social Work Placement (Signed)
   CLINICAL SOCIAL WORK PLACEMENT  NOTE  Date:  12/21/2015  Patient Details  Name: KEMYA SHED MRN: [id number] Date of Birth: 08/14/1934  Clinical Social Work is seeking post-discharge placement for this patient at the [place] level of care (*CSW will initial, date and re-position this form in  chart as items are completed):  Yes   Patient/family provided with [place] Work Department's list of facilities offering this level of care within the geographic area requested by the patient (or if unable, by the patient's family).  Yes   Patient/family informed of their freedom to choose among providers that offer the needed level of care, that participate in Medicare, Medicaid or managed care program needed by the patient, have an available bed and are willing to accept the patient.  Yes   Patient/family informed of [facility]'s ownership interest in [facility] and [facility], as well as of the fact that they are under no obligation to receive care at these facilities.  PASRR submitted to EDS on       PASRR number received on       Existing PASRR number confirmed on 12/21/15     FL2 transmitted to all facilities in geographic area requested by pt/family on 12/21/15     FL2 transmitted to all facilities within larger geographic area on       Patient informed that his/her managed care company has contracts with or will negotiate with certain facilities, including the following:            Patient/family informed of bed offers received.  Patient chooses bed at       Physician recommends and patient chooses bed at      Patient to be transferred to   on  .  Patient to be transferred to facility by       Patient family notified on   of transfer.  Name of family member notified:        PHYSICIAN       Additional Comment:    _______________________________________________ Luretha Rued, [place] 12/21/2015, 11:55  AM

## 2015-12-21 NOTE — Evaluation (Addendum)
Occupational Therapy Evaluation Patient Details Name: [name] MRN: [id number] DOB: 10/31/1934 Today's Date: 12/21/2015    History of Present Illness 81 y.o. female with h/o R hip hemiarthroplasty, stage IV lung cancer on 4 L O2 at home admitted with a fall, back & R groin pain, UTI. Imaging negative for fractures.    Clinical Impression   This 81 year old female was admitted for the above.  She will benefit from skilled OT to increase independence with adls and toilet transfers.  Pt tolerated session, but did not bear weight through RLE.  She needed mod +2 for transfers and increased assistance for LB adls.  Goals in acute are for min A    Follow Up Recommendations  SNF (unless pt progresses and has 24/7)    Equipment Recommendations  None recommended by OT    Recommendations for Other Services       Precautions / Restrictions Precautions Precautions: Fall Restrictions Weight Bearing Restrictions: No Other Position/Activity Restrictions: pt does not place weight on RLE      Mobility Bed Mobility Overal bed mobility: Needs Assistance Bed Mobility: Supine to Sit     Supine to sit: Mod assist     General bed mobility comments: light assistance for legs and mod A for trunk  Transfers Overall transfer level: Needs assistance Equipment used: Rolling walker (2 wheeled) Transfers: Sit to/from [facility] Sit to Stand: Mod assist;+2 safety/equipment Stand pivot transfers: Mod assist;+2 safety/equipment       General transfer comment: pt unable to tolerate WB on RLE 2* pain, able to partially pivot on LLE, MOD A to pivot; SaO2 98% on 4L O2 during activity    Balance Overall balance assessment: Needs assistance   Sitting balance-Leahy Scale: Fair       Standing balance-Leahy Scale: Poor                              ADL Overall ADL's : Needs assistance/impaired     Grooming: Set up;Sitting;Brushing hair   Upper Body Bathing:  Set up;Sitting   Lower Body Bathing: Maximal assistance;+2 for safety/equipment;Sit to/from stand   Upper Body Dressing : Minimal assistance;Sitting (lines)   Lower Body Dressing: Total assistance;Sit to/from stand;+2 for safety/equipment   Toilet Transfer: Minimal assistance;BSC;RW;+2 for safety/equipment   Toileting- Clothing Manipulation and Hygiene: Moderate assistance;Sit to/from stand;+2 for safety/equipment (sit for front)         General ADL Comments: pt had difficulty bearing weight. c/o pain in back and groin by hip:  did not educate on AE this visit.       Vision     Perception     Praxis      Pertinent Vitals/Pain Pain Assessment: Faces Pain Score: 6  Faces Pain Scale: Hurts even more Pain Location: R groin  Pain Descriptors / Indicators: Sore Pain Intervention(s): Limited activity within patient's tolerance;Monitored during session;Premedicated before session;Repositioned     Hand Dominance     Extremity/Trunk Assessment Upper Extremity Assessment Upper Extremity Assessment: Generalized weakness (grossly 3+ to 4-/5)   Lower Extremity Assessment Lower Extremity Assessment: Generalized weakness (-4/5 BLEs, sensation intact; R hip internally rotated)   Cervical / Trunk Assessment Cervical / Trunk Assessment: Kyphotic   Communication Communication Communication: No difficulties   Cognition Arousal/Alertness: Awake/alert Behavior During Therapy: WFL for tasks assessed/performed Overall Cognitive Status: No family/caregiver present to determine baseline cognitive functioning       Memory: Decreased short-term  memory (confused at times)  Pt had difficulty changing channels on TV.  Had daughter's phone number at beginning of session, and didn't think she had her number at the end.               General Comments       Exercises       Shoulder Instructions      Home Living Family/patient expects to be discharged to:: Unsure Living  Arrangements: Children Available Help at Discharge: Family;Available 24 hours/day                         Home Equipment: Bedside commode   Additional Comments: per RN, pt's daughter plans to have pt come home with her where she will provide 19* assistance; PTA pt lived with husband who is legally blind      Prior Functioning/Environment          Comments: pt poor historian due to mild confusion; per chart pt primarily use WC at home, she also has a RW, when she fell she wasn't using her RW    OT Diagnosis: Acute pain   OT Problem List: Decreased strength;Decreased activity tolerance;Impaired balance (sitting and/or standing);Decreased cognition;Decreased knowledge of use of DME or AE;Pain   OT Treatment/Interventions: Self-care/ADL training;DME and/or AE instruction;Patient/family education;Therapeutic activities;Balance training    OT Goals(Current goals can be found in the care plan section) Acute Rehab OT Goals Patient Stated Goal: none stated; agreeable to therapy OT Goal Formulation: With patient Time For Goal Achievement: 12/28/15 Potential to Achieve Goals: Good ADL Goals Pt Will Perform Lower Body Bathing: with min assist;with adaptive equipment;sit to/from stand Pt Will Transfer to Toilet: with min assist;stand pivot transfer;bedside commode Pt Will Perform Toileting - Clothing Manipulation and hygiene: with min assist;sit to/from stand Additional ADL Goal #1: pt will perform bed mobility with min A in preparation for adls Additional ADL Goal #2: Pt will consistently use call bell for assistance and needs without cues  OT Frequency: Min 2X/week   Barriers to D/C:            Co-evaluation PT/OT/SLP Co-Evaluation/Treatment: Yes Reason for Co-Treatment: Complexity of the patient's impairments (multi-system involvement);For patient/therapist safety PT goals addressed during session: Mobility/safety with mobility OT goals addressed during session: ADL's and  self-care      End of Session Nurse Communication: Mobility status  Activity Tolerance: Patient limited by pain Patient left: in chair;with call bell/phone within reach;with chair alarm set   Time: 1029-1053 OT Time Calculation (min): 24 min Charges:  OT General Charges $OT Visit: 1 Procedure OT Evaluation $OT Eval Low Complexity: 1 Procedure G-Codes: OT G-codes **NOT FOR INPATIENT CLASS** Functional Assessment Tool Used: clinical judgment and observation Functional Limitation: Self care Self Care Current Status (Z1245): At least 80 percent but less than 100 percent impaired, limited or restricted Self Care Goal Status (Y0998): At least 20 percent but less than 40 percent impaired, limited or restricted  Trason Shifflet 12/21/2015, 12:22 PM Lesle Chris, OTR/L [phone] 12/21/2015

## 2015-12-21 NOTE — Care Management Note (Signed)
Case Management Note  Patient Details  Name: ZAIDE MCCLENAHAN MRN: [id number] Date of Birth: 1934-11-25  Subjective/Objective:                  fall Action/Plan: Discharge planning Expected Discharge Date:                 Expected Discharge Plan:  [facility]  In-House Referral:     Discharge planning Services     Post Acute Care Choice:    Choice offered to:  Patient  DME Arranged:    DME Agency:     HH Arranged:    [facility] Agency:     Status of Service:  Completed, signed off  If discussed at [employer] of Stay Meetings, dates discussed:    Additional Comments: CM met with pt to confirm SNF is her plan for discharge; pt confirms; CSW aware and arranging.  No other CM needs were communicated. Dellie Catholic, RN 12/21/2015, 1:07 PM

## 2015-12-21 NOTE — Evaluation (Addendum)
Physical Therapy Evaluation Patient Details Name: [name] MRN: [id number] DOB: February 10, 1935 Today's Date: 12/21/2015   History of Present Illness  81 y.o. female with h/o R hip hemiarthroplasty, stage IV lung cancer on 4 L O2 at home admitted with a fall, back & R groin pain, UTI. Imaging negative for fractures.   Clinical Impression  Pt admitted with above diagnosis. Pt currently with functional limitations due to the deficits listed below (see PT Problem List). Moderate assistance for stand pivot transfer, pt not able to tolerate weight bearing on RLE due to pain. She also maintains RLE in internally rotated position.  SaO2 98% on 4L O2 during activity. Mild confusion noted.  She will need 24* assistance.  Per RN, pt's daughter plans to take pt home with her where she can provide 56* assistance.  Pt will benefit from skilled PT to increase their independence and safety with mobility to allow discharge to the venue listed below.       Follow Up Recommendations SNF;Supervision/Assistance - 24 hour;Home health PT (SNF if family not able to provide 24* assistance vs. HHPT )    Equipment Recommendations  None recommended by PT    Recommendations for Other Services OT consult     Precautions / Restrictions Precautions Precautions: Fall Restrictions Weight Bearing Restrictions: No      Mobility  Bed Mobility Overal bed mobility: Needs Assistance Bed Mobility: Supine to Sit     Supine to sit: Mod assist     General bed mobility comments: mod A to raise trunk and advance BLEs to EOB, used pad to assist with pivoting hips to EOB  Transfers Overall transfer level: Needs assistance Equipment used: Rolling walker (2 wheeled) Transfers: Sit to/from [facility] Sit to Stand: +2 safety/equipment;Mod assist Stand pivot transfers: Mod assist;+2 safety/equipment;+2 physical assistance       General transfer comment: pt unable to tolerate WB on RLE 2* pain, able to  partially pivot on LLE, MOD A to pivot  Ambulation/Gait             General Gait Details: unable  Stairs            Wheelchair Mobility    Modified Rankin (Stroke Patients Only)       Balance Overall balance assessment: Needs assistance   Sitting balance-Leahy Scale: Fair       Standing balance-Leahy Scale: Poor                               Pertinent Vitals/Pain Pain Assessment: Faces Pain Score: 6  Pain Location: R groin  Pain Descriptors / Indicators: Sore Pain Intervention(s): Premedicated before session;Patient requesting pain meds-RN notified;Limited activity within patient's tolerance;Monitored during session;Repositioned    Home Living Family/patient expects to be discharged to:: Private residence Living Arrangements: Children Available Help at Discharge: Family;Available 24 hours/day           Home Equipment: Walker - 2 wheels;Wheelchair - manual Additional Comments: per RN, pt's daughter plans to have pt come home with her where she will provide 62* assistance; PTA pt lived with husband who is legally blind    Prior Function           Comments: pt poor historian due to mild confusion; per chart pt primarily use WC at home, she also has a RW, when she fell she wasn't using her RW     Hand Dominance  Extremity/Trunk Assessment   Upper Extremity Assessment: Defer to OT evaluation           Lower Extremity Assessment: Generalized weakness (-4/5 BLEs, sensation intact)      Cervical / Trunk Assessment: Kyphotic  Communication   Communication: No difficulties  Cognition Arousal/Alertness: Awake/alert Behavior During Therapy: WFL for tasks assessed/performed Overall Cognitive Status: No family/caregiver present to determine baseline cognitive functioning (pt mildly confused)       Memory: Decreased short-term memory              General Comments      Exercises        Assessment/Plan    PT  Assessment Patient needs continued PT services  PT Diagnosis Difficulty walking;Generalized weakness;Acute pain;Altered mental status   PT Problem List Decreased strength;Decreased activity tolerance;Decreased balance;Pain;Decreased knowledge of use of DME;Decreased mobility  PT Treatment Interventions DME instruction;Gait training;Functional mobility training;Therapeutic activities;Patient/family education;Balance training;Therapeutic exercise;Wheelchair mobility training   PT Goals (Current goals can be found in the Care Plan section) Acute Rehab PT Goals PT Goal Formulation: Patient unable to participate in goal setting Time For Goal Achievement: 01/04/16    Frequency Min 3X/week   Barriers to discharge Decreased caregiver support      Co-evaluation PT/OT/SLP Co-Evaluation/Treatment: Yes Reason for Co-Treatment: Complexity of the patient's impairments (multi-system involvement);For patient/therapist safety PT goals addressed during session: Mobility/safety with mobility;Balance;Proper use of DME         End of Session Equipment Utilized During Treatment: Gait belt Activity Tolerance: Patient limited by pain Patient left: in chair;with call bell/phone within reach;with chair alarm set Nurse Communication: Mobility status    Functional Assessment Tool Used: clinical judgement Functional Limitation: Mobility: Walking and moving around Mobility: Walking and Moving Around Current Status (T5320): At least 60 percent but less than 80 percent impaired, limited or restricted Mobility: Walking and Moving Around Goal Status [phone]): At least 40 percent but less than 60 percent impaired, limited or restricted    Time: 1029-1053 PT Time Calculation (min) (ACUTE ONLY): 24 min   Charges:   PT Evaluation $PT Eval Moderate Complexity: 1 Procedure     PT G Codes:   PT G-Codes **NOT FOR INPATIENT CLASS** Functional Assessment Tool Used: clinical judgement Functional Limitation: Mobility:  Walking and moving around Mobility: Walking and Moving Around Current Status (H6861): At least 60 percent but less than 80 percent impaired, limited or restricted Mobility: Walking and Moving Around Goal Status [phone]): At least 40 percent but less than 60 percent impaired, limited or restricted    Philomena Doheny 12/21/2015, 11:06 AM [phone]

## 2015-12-22 ENCOUNTER — Observation Stay (HOSPITAL_COMMUNITY): Payer: Medicare Other

## 2015-12-22 DIAGNOSIS — R1084 Generalized abdominal pain: Secondary | ICD-10-CM | POA: Diagnosis not present

## 2015-12-22 DIAGNOSIS — M546 Pain in thoracic spine: Secondary | ICD-10-CM | POA: Diagnosis not present

## 2015-12-22 DIAGNOSIS — I10 Essential (primary) hypertension: Secondary | ICD-10-CM | POA: Diagnosis not present

## 2015-12-22 DIAGNOSIS — E43 Unspecified severe protein-calorie malnutrition: Secondary | ICD-10-CM | POA: Diagnosis not present

## 2015-12-22 DIAGNOSIS — W19XXXA Unspecified fall, initial encounter: Secondary | ICD-10-CM | POA: Diagnosis not present

## 2015-12-22 NOTE — Progress Notes (Signed)
PROGRESS NOTE  [name]  DTO:[id number] DOB: Feb 15, 1935 DOA: 12/20/2015 PCP: [name], MD Outpatient Specialists:  Subjective: Complaining today about abdominal pain, her abdomen somewhat distended and tender. Reported last BM about 2 days ago, will obtain abdominal x-ray.  Brief Narrative:  [name] is a 81 y.o. female with medical history significant of Stage IV NSCLC (s/p of radiation), hypertension, COPD on 4 L oxygen at home, tobacco abuse, who presents with fall and tenderness over the left mid back and right hip. Pt states that she tripped her steps and fell when she was in her kitchen yesterday. She injured her right hip and left mid back, causing severe pain. She described the pain over right hip and mid back as constant, 9/10 in severity, nonradiating. Denies any head or neck injury. No headache or neck pain. No LOC. Patient does not have numbness in her extremities. Pt states that she has to scoot to couch and pull herself on the couch. She is able to ambulate in her home with walker, but primarily in wheelchair. Her husband is legally blinded, and cannot take care of patient at home. She has mild chronic cough and mild shortness of breath, which is at the baseline. No nausea, vomiting, abdominal pain. She said that she has loose stool due to taking laxatives for constipaiton. Patient states that she has mild dysuria. No unilateral weakness, vision change or hearing loss. No urinary incontinence or loss of control of bowel movement.   Assessment & Plan:   Principal Problem:   Fall Active Problems:   COPD (chronic obstructive pulmonary disease) (HCC)   HTN (hypertension)   Local recurrence of left lung cancer (HCC)   Protein-calorie malnutrition, severe (HCC)   Tobacco abuse   Back pain   Right hip pain   UTI (urinary tract infection)   Fall -No loss of consciousness reported, seems have had a mechanic fall.  -Now has severe pain over right hip and left mid back.  No acute bony fracture by images.  -will place on med-surg bed for obs -pain control: When necessary Percocet and the low-dose morphine for pain -When necessary Zofran for nausea. -PT/OT recommended skilled nursing facility, likely can be discharged tomorrow if her abdominal pain is better.  Abdominal pain/distention -This is new since yesterday, reported some pain and distention as well as tenderness on palpation. -Obtain x-ray. Reported bowel movement about 2 days ago, discontinue IV fluids.  UTI:  -Patient has dysuria and positive urinalysis, consistent with UTI. -Continue Rocephin, culture still pending.  COPD with chronic respiratory failure with hypoxia -Stable with no decompensation, on 4 L of oxygen at home. -When necessary Mucinex for cough -Dulera inhaler -When necessary albuterol nebulizers -Scheduled DuoNeb nebulizers  HTN: Blood pressure 127/54 -Continue Zocor,   Local recurrence of left lung cancer [name]: Initially diagnosed as stage IB non-small cell lung cancer, squamous cell carcinoma, s/p of curative stereotactic body radiotherapy and s/p of palliative radiotherapy to the subpleural left lower lobe lung mass. Patient is followed up by Dr. Julien Nordmann. Currently is and observation. -Follow-up with Dr. Julien Nordmann  Protein-calorie malnutrition, severe [facility]): -ensure  Tobacco abuse -nicotine patch  DVT prophylaxis: Lovenox Code Status: DNR Family Communication:  Disposition Plan:  Diet: Diet Heart Room service appropriate?: Yes; Fluid consistency:: Thin  Consultants:   None  Procedures:   None  Antimicrobials:   None  Objective: Filed Vitals:   12/22/15 0100 12/22/15 0130 12/22/15 0609 12/22/15 0717  BP:   112/50  Pulse: 112  95   Temp:   98 F (36.7 C)   TempSrc:   Oral   Resp:   18   Height:      Weight:      SpO2: 92% 97% 100% 97%    Intake/Output Summary (Last 24 hours) at 12/22/15 1234 Last data filed at 12/22/15 1154  Gross per 24  hour  Intake 1961.67 ml  Output    800 ml  Net 1161.67 ml   Filed Weights   12/20/15 1740  Weight: 39.917 kg (88 lb)    Examination: General exam: Appears calm and comfortable  Respiratory system: Clear to auscultation. Respiratory effort normal. Cardiovascular system: S1 & S2 heard, RRR. No JVD, murmurs, rubs, gallops or clicks. No pedal edema. Gastrointestinal system: Abdomen is nondistended, soft and nontender. No organomegaly or masses felt. Normal bowel sounds heard. Central nervous system: Alert and oriented. No focal neurological deficits. Extremities: Symmetric 5 x 5 power. Skin: No rashes, lesions or ulcers Psychiatry: Judgement and insight appear normal. Mood & affect appropriate.   Data Reviewed: I have personally reviewed following labs and imaging studies  CBC:  Recent Labs Lab 12/20/15 2216 12/21/15 0140  WBC 15.2* 13.1*  HGB 9.6* 8.2*  HCT 31.6* 25.6*  MCV 107.1* 105.3*  PLT 495* 245*   Basic Metabolic Panel:  Recent Labs Lab 12/20/15 2351 12/21/15 0140  NA 135 134*  K 4.2 4.1  CL 92* 92*  CO2 38* 37*  GLUCOSE 167* 103*  BUN 15 17  CREATININE 0.43* 0.42*  CALCIUM 9.3 9.1   GFR: Estimated Creatinine Clearance: 36.5 mL/min (by C-G formula based on Cr of 0.42). Liver Function Tests: No results for input(s): AST, ALT, ALKPHOS, BILITOT, PROT, ALBUMIN in the last 168 hours. No results for input(s): LIPASE, AMYLASE in the last 168 hours. No results for input(s): AMMONIA in the last 168 hours. Coagulation Profile: No results for input(s): INR, PROTIME in the last 168 hours. Cardiac Enzymes: No results for input(s): CKTOTAL, CKMB, CKMBINDEX, TROPONINI in the last 168 hours. BNP (last 3 results) No results for input(s): PROBNP in the last 8760 hours. HbA1C: No results for input(s): HGBA1C in the last 72 hours. CBG: No results for input(s): GLUCAP in the last 168 hours. Lipid Profile: No results for input(s): CHOL, HDL, LDLCALC, TRIG, CHOLHDL,  LDLDIRECT in the last 72 hours. Thyroid Function Tests: No results for input(s): TSH, T4TOTAL, FREET4, T3FREE, THYROIDAB in the last 72 hours. Anemia Panel: No results for input(s): VITAMINB12, FOLATE, FERRITIN, TIBC, IRON, RETICCTPCT in the last 72 hours. Urine analysis:    Component Value Date/Time   COLORURINE YELLOW 12/20/2015 2205   APPEARANCEUR CLOUDY* 12/20/2015 2205   LABSPEC 1.019 12/20/2015 2205   PHURINE 5.5 12/20/2015 2205   GLUCOSEU NEGATIVE 12/20/2015 2205   HGBUR SMALL* 12/20/2015 2205   BILIRUBINUR NEGATIVE 12/20/2015 2205   KETONESUR 40* 12/20/2015 2205   PROTEINUR NEGATIVE 12/20/2015 2205   UROBILINOGEN 0.2 03/07/2014 1122   NITRITE POSITIVE* 12/20/2015 2205   LEUKOCYTESUR MODERATE* 12/20/2015 2205   Sepsis Labs: '@LABRCNTIP'$ (procalcitonin:4,lacticidven:4)  )No results found for this or any previous visit (from the past 240 hour(s)).   Invalid input(s): PROCALCITONIN, LACTICACIDVEN   Radiology Studies: Dg Knee 2 Views Right  12/20/2015  CLINICAL DATA:  Right knee pain since fall yesterday. Unable to bear weight since the fall. EXAM: RIGHT KNEE - 1-2 VIEW COMPARISON:  Radiographs 03/07/2014 FINDINGS: No acute fracture or subluxation. Alignment joint spaces are maintained. No knee joint effusion. The  bones are under mineralized. Remote midshaft tibia and fibular fractures are partially included. IMPRESSION: No acute fracture or subluxation of the right knee. Electronically Signed   By: Jeb Levering M.D.   On: 12/20/2015 21:06   Ct Thoracic Spine Wo Contrast  12/20/2015  CLINICAL DATA:  Mechanical fall in kitchen at home yesterday. RIGHT hip pain. EXAM: CT THORACIC AND LUMBAR SPINE WITHOUT CONTRAST TECHNIQUE: Multidetector CT imaging of the thoracic and lumbar spine was performed without contrast. Multiplanar CT image reconstructions were also generated. COMPARISON:  MRI of the lumbar spine February 27th 2017 and CT chest August 01, 2015 FINDINGS: CT THORACIC  SPINE FINDINGS ALIGNMENT: Thoracic vertebral bodies are in alignment. Maintained thoracic lordosis. OSSEOUS STRUCTURES: Mild wedging vertebral body T1 with less than 25% height loss, endplate sclerosis. Moderate to severe chronic T12 compression fracture, similar in appearance to prior MRI. Osteopenia. Intervertebral disc heights generally preserved. Healing tenth through twelfth lateral rib fractures. Dense rounded consolidation measuring at least 3.9 cm LEFT lower lobe with increasing small to moderate pleural effusion. Severe centrilobular emphysema. SOFT TISSUES: Included prevertebral and paraspinal soft tissues are normal. CT LUMBAR SPINE FINDINGS SEGMENTATION: For the purposes of this report the last well-formed intervertebral disc space will be described as L5-S1. ALIGNMENT: Lumbar vertebral bodies in alignment, maintenance of the lumbar lordosis. OSSEOUS STRUCTURES: Lumbar vertebral bodies and posterior elements are intact. Mild L2-3 disc height loss compatible with early degenerative disc. No destructive bony lesions. Osteopenia. Moderate lower lumbar facet arthropathy. No osseous canal stenosis. Mild LEFT foraminal narrowing. Symmetric linear densities in the sacrum. SOFT TISSUES: Included prevertebral and paraspinal soft tissues are nonsuspicious. Partially imaged cyst in RIGHT renal fossa, RIGHT kidney not visualized on the CT. IMPRESSION: CT THORACIC SPINE IMPRESSION No acute thoracic fracture or malalignment. Mild T1 and moderate to severe T12 old compression fractures. Masslike consolidation LEFT lower lobe with increasing small to moderate pleural effusion concerning for neoplasm. CT LUMBAR SPINE IMPRESSION No acute lumbar fracture or malalignment. Symmetric linear densities in the sacrum, equivocal for stress fractures. Recommend correlation with point tenderness. Electronically Signed   By: Elon Alas M.D.   On: 12/20/2015 19:19   Ct Lumbar Spine Wo Contrast  12/20/2015  CLINICAL DATA:   Mechanical fall in kitchen at home yesterday. RIGHT hip pain. EXAM: CT THORACIC AND LUMBAR SPINE WITHOUT CONTRAST TECHNIQUE: Multidetector CT imaging of the thoracic and lumbar spine was performed without contrast. Multiplanar CT image reconstructions were also generated. COMPARISON:  MRI of the lumbar spine February 27th 2017 and CT chest August 01, 2015 FINDINGS: CT THORACIC SPINE FINDINGS ALIGNMENT: Thoracic vertebral bodies are in alignment. Maintained thoracic lordosis. OSSEOUS STRUCTURES: Mild wedging vertebral body T1 with less than 25% height loss, endplate sclerosis. Moderate to severe chronic T12 compression fracture, similar in appearance to prior MRI. Osteopenia. Intervertebral disc heights generally preserved. Healing tenth through twelfth lateral rib fractures. Dense rounded consolidation measuring at least 3.9 cm LEFT lower lobe with increasing small to moderate pleural effusion. Severe centrilobular emphysema. SOFT TISSUES: Included prevertebral and paraspinal soft tissues are normal. CT LUMBAR SPINE FINDINGS SEGMENTATION: For the purposes of this report the last well-formed intervertebral disc space will be described as L5-S1. ALIGNMENT: Lumbar vertebral bodies in alignment, maintenance of the lumbar lordosis. OSSEOUS STRUCTURES: Lumbar vertebral bodies and posterior elements are intact. Mild L2-3 disc height loss compatible with early degenerative disc. No destructive bony lesions. Osteopenia. Moderate lower lumbar facet arthropathy. No osseous canal stenosis. Mild LEFT foraminal narrowing. Symmetric linear densities in  the sacrum. SOFT TISSUES: Included prevertebral and paraspinal soft tissues are nonsuspicious. Partially imaged cyst in RIGHT renal fossa, RIGHT kidney not visualized on the CT. IMPRESSION: CT THORACIC SPINE IMPRESSION No acute thoracic fracture or malalignment. Mild T1 and moderate to severe T12 old compression fractures. Masslike consolidation LEFT lower lobe with increasing  small to moderate pleural effusion concerning for neoplasm. CT LUMBAR SPINE IMPRESSION No acute lumbar fracture or malalignment. Symmetric linear densities in the sacrum, equivocal for stress fractures. Recommend correlation with point tenderness. Electronically Signed   By: Elon Alas M.D.   On: 12/20/2015 19:19   Dg Hip Unilat With Pelvis 2-3 Views Right  12/20/2015  CLINICAL DATA:  Pain following fall 1 day prior EXAM: DG HIP (WITH OR WITHOUT PELVIS) 2-3V RIGHT COMPARISON:  March 07, 2014 FINDINGS: Frontal pelvis as well as frontal and lateral right hip images were obtained. There is a total hip prosthesis on the right which appears well seated. There are foci of myositis ossificans superior and inferior to the right hip joint. There old healed fractures of the right ischium and right superior pubic ramus. There is no evident acute fracture or dislocation. There is slight narrowing of the left hip joint. No erosive changes. IMPRESSION: Total hip prosthesis on the right with prosthetic components well-seated. Old trauma involving the right superior pubic ramus and ischium. No acute fracture or dislocation evident. Myositis ossifications noted in the right hip joint region superiorly and inferiorly. Electronically Signed   By: Lowella Grip III M.D.   On: 12/20/2015 18:32        Scheduled Meds: . calcium-vitamin D  1 tablet Oral Q breakfast  . cefTRIAXone (ROCEPHIN)  IV  1 g Intravenous Q24H  . enoxaparin (LOVENOX) injection  30 mg Subcutaneous QHS  . feeding supplement (ENSURE ENLIVE)  237 mL Oral BID BM  . ipratropium-albuterol  3 mL Inhalation QID  . losartan  25 mg Oral Daily  . mometasone-formoterol  2 puff Inhalation BID  . multivitamin with minerals  1 tablet Oral Daily  . nicotine  14 mg Transdermal Daily   Continuous Infusions: . sodium chloride 100 mL/hr at 12/21/15 2014        Time spent: 35 minutes    Antonina Deziel A, MD [facility] Pager  [phone]  If 7PM-7AM, please contact night-coverage [email or web address] Password [facility] 12/22/2015, 12:34 PM

## 2015-12-22 NOTE — Clinical Social Work Placement (Signed)
CSW received call from RN, Marylin Crosby that patient's daughter would like to change her SNF decision to [facility]. CSW confirmed with Santiago Glad at [facility] that they would be able to offer a bed & take patient today if ready - patient's daughter, [name] made aware.   CSW has completed FL2 & will continue to follow and assist with discharge when ready.    Raynaldo Opitz, [facility] Clinical Social Worker cell #: [phone]     CLINICAL SOCIAL WORK PLACEMENT  NOTE  Date:  12/22/2015  Patient Details  Name: [name] MRN: [id number] Date of Birth: 06/30/1934  Clinical Social Work is seeking post-discharge placement for this patient at the [place] level of care (*CSW will initial, date and re-position this form in  chart as items are completed):  Yes   Patient/family provided with [facility] Work Department's list of facilities offering this level of care within the geographic area requested by the patient (or if unable, by the patient's family).  Yes   Patient/family informed of their freedom to choose among providers that offer the needed level of care, that participate in Medicare, Medicaid or managed care program needed by the patient, have an available bed and are willing to accept the patient.  Yes   Patient/family informed of [facility]'s ownership interest in [facility] and [facility], as well as of the fact that they are under no obligation to receive care at these facilities.  PASRR submitted to EDS on       PASRR number received on       Existing PASRR number confirmed on 12/21/15     FL2 transmitted to all facilities in geographic area requested by pt/family on 12/21/15     FL2 transmitted to all facilities within larger geographic area on       Patient informed that his/her managed care company has contracts with or will negotiate with certain facilities, including the following:         Yes   Patient/family informed of bed offers received.  Patient chooses bed at [facility]     Physician recommends and patient chooses bed at      Patient to be transferred to [facility] on  .  Patient to be transferred to facility by       Patient family notified on   of transfer.  Name of family member notified:        PHYSICIAN       Additional Comment:    _______________________________________________ Standley Brooking, LCSW 12/22/2015, 10:33 AM

## 2015-12-22 NOTE — Care Management Obs Status (Signed)
[place] NOTIFICATION   Patient Details  Name: GENESIA CASLIN MRN: [id number] Date of Birth: August 23, 1934   Medicare Observation Status Notification Given:       Dellie Catholic, RN 12/22/2015, 4:09 PM

## 2015-12-22 NOTE — Care Management Obs Status (Signed)
[place] NOTIFICATION   Patient Details  Name: [name] MRN: [id number] Date of Birth: [birth year]   Medicare Observation Status Notification Given:  Yes    Dellie Catholic, RN 12/22/2015, 4:10 PM

## 2015-12-23 DIAGNOSIS — M25561 Pain in right knee: Secondary | ICD-10-CM | POA: Diagnosis not present

## 2015-12-23 DIAGNOSIS — J9611 Chronic respiratory failure with hypoxia: Secondary | ICD-10-CM | POA: Diagnosis not present

## 2015-12-23 DIAGNOSIS — Z7982 Long term (current) use of aspirin: Secondary | ICD-10-CM | POA: Diagnosis not present

## 2015-12-23 DIAGNOSIS — Z79899 Other long term (current) drug therapy: Secondary | ICD-10-CM | POA: Diagnosis not present

## 2015-12-23 DIAGNOSIS — D0220 Carcinoma in situ of unspecified bronchus and lung: Secondary | ICD-10-CM | POA: Diagnosis not present

## 2015-12-23 DIAGNOSIS — N390 Urinary tract infection, site not specified: Secondary | ICD-10-CM | POA: Diagnosis not present

## 2015-12-23 DIAGNOSIS — I700 Atherosclerosis of aorta: Secondary | ICD-10-CM | POA: Diagnosis not present

## 2015-12-23 DIAGNOSIS — E43 Unspecified severe protein-calorie malnutrition: Secondary | ICD-10-CM | POA: Diagnosis not present

## 2015-12-23 DIAGNOSIS — Z515 Encounter for palliative care: Secondary | ICD-10-CM | POA: Diagnosis not present

## 2015-12-23 DIAGNOSIS — Z66 Do not resuscitate: Secondary | ICD-10-CM | POA: Diagnosis not present

## 2015-12-23 DIAGNOSIS — K5900 Constipation, unspecified: Secondary | ICD-10-CM | POA: Diagnosis not present

## 2015-12-23 DIAGNOSIS — W19XXXA Unspecified fall, initial encounter: Secondary | ICD-10-CM | POA: Diagnosis not present

## 2015-12-23 DIAGNOSIS — R296 Repeated falls: Secondary | ICD-10-CM | POA: Diagnosis not present

## 2015-12-23 DIAGNOSIS — R627 Adult failure to thrive: Secondary | ICD-10-CM | POA: Diagnosis not present

## 2015-12-23 DIAGNOSIS — M25551 Pain in right hip: Secondary | ICD-10-CM | POA: Diagnosis not present

## 2015-12-23 DIAGNOSIS — M546 Pain in thoracic spine: Secondary | ICD-10-CM | POA: Diagnosis not present

## 2015-12-23 DIAGNOSIS — M25559 Pain in unspecified hip: Secondary | ICD-10-CM | POA: Diagnosis not present

## 2015-12-23 DIAGNOSIS — R110 Nausea: Secondary | ICD-10-CM | POA: Diagnosis not present

## 2015-12-23 DIAGNOSIS — R259 Unspecified abnormal involuntary movements: Secondary | ICD-10-CM | POA: Diagnosis not present

## 2015-12-23 DIAGNOSIS — D649 Anemia, unspecified: Secondary | ICD-10-CM | POA: Diagnosis not present

## 2015-12-23 DIAGNOSIS — M19049 Primary osteoarthritis, unspecified hand: Secondary | ICD-10-CM | POA: Diagnosis not present

## 2015-12-23 DIAGNOSIS — J449 Chronic obstructive pulmonary disease, unspecified: Secondary | ICD-10-CM | POA: Diagnosis not present

## 2015-12-23 DIAGNOSIS — M6281 Muscle weakness (generalized): Secondary | ICD-10-CM | POA: Diagnosis not present

## 2015-12-23 DIAGNOSIS — Z96641 Presence of right artificial hip joint: Secondary | ICD-10-CM | POA: Diagnosis not present

## 2015-12-23 DIAGNOSIS — R5381 Other malaise: Secondary | ICD-10-CM | POA: Diagnosis not present

## 2015-12-23 DIAGNOSIS — C3492 Malignant neoplasm of unspecified part of left bronchus or lung: Secondary | ICD-10-CM | POA: Diagnosis not present

## 2015-12-23 DIAGNOSIS — R262 Difficulty in walking, not elsewhere classified: Secondary | ICD-10-CM | POA: Diagnosis not present

## 2015-12-23 DIAGNOSIS — Z923 Personal history of irradiation: Secondary | ICD-10-CM | POA: Diagnosis not present

## 2015-12-23 DIAGNOSIS — R1084 Generalized abdominal pain: Secondary | ICD-10-CM | POA: Diagnosis not present

## 2015-12-23 DIAGNOSIS — M549 Dorsalgia, unspecified: Secondary | ICD-10-CM | POA: Diagnosis not present

## 2015-12-23 DIAGNOSIS — Z9981 Dependence on supplemental oxygen: Secondary | ICD-10-CM | POA: Diagnosis not present

## 2015-12-23 DIAGNOSIS — I10 Essential (primary) hypertension: Secondary | ICD-10-CM | POA: Diagnosis not present

## 2015-12-23 LAB — URINE CULTURE

## 2015-12-23 MED ORDER — POLYETHYLENE GLYCOL 3350 17 G PO PACK: 17.0000 g | PACK | Freq: Every day | ORAL | Status: AC

## 2015-12-23 MED ORDER — SENNOSIDES 8.6 MG PO TABS: 1.0000 | ORAL_TABLET | Freq: Every day | ORAL | Status: AC

## 2015-12-23 MED ORDER — OXYCODONE-ACETAMINOPHEN 5-325 MG PO TABS: 1.0000 | ORAL_TABLET | ORAL | Status: AC | PRN

## 2015-12-23 NOTE — Discharge Summary (Signed)
Physician Discharge Summary  [name] CWC:[id number] DOB: [birth year] DOA: 12/20/2015  PCP: [name], MD  Admit date: 12/20/2015 Discharge date: 12/23/2015  Admitted From: Home Disposition:  SNF  Recommendations for Outpatient Follow-up:  1. Follow up with PCP in 1-2 weeks 2. Please obtain BMP/CBC in one week 3. Patient is started on regimen for constipation please follow patient closely to ensure that she has BMs.  Home Health: N/A Equipment/Devices: N/A  Discharge Condition: Stable CODE STATUS: DNR/DNI Diet recommendation: Heart Healthy  Brief/Interim Summary: [name] is a 81 y.o. female with medical history significant ofstage IV NSCLC (s/p of radiation), hypertension, COPD on 4 L oxygen at home, tobacco abuse, who presents with fall and tenderness over the left mid back and right hip. Pt states that she tripped her steps and fell when she was in her kitchen yesterday. She injured her right hip and left mid back, causing severe pain. She described the pain over right hip and mid back as constant, 9/10 in severity, nonradiating. Denies any head or neck injury. No headache or neck pain. No LOC. Patient does not have numbness in her extremities. Pt states that she has to scoot to couch and pull herself on the couch. She is able to ambulate in her home with walker, but primarily in wheelchair. Her husband is legally blinded, and cannot take care of patient at home. She has mild chronic cough and mild shortness of breath, which is at the baseline. No nausea, vomiting, abdominal pain. She said that she has loose stool due to taking laxatives for constipaiton. Patient states that she has mild dysuria. No unilateral weakness, vision change or hearing loss. No urinary incontinence or loss of control of bowel movement.   Discharge Diagnoses:  Principal Problem:   Fall Active Problems:   COPD (chronic obstructive pulmonary disease) (HCC)   HTN (hypertension)   Local recurrence of  left lung cancer (HCC)   Protein-calorie malnutrition, severe (HCC)   Tobacco abuse   Back pain   Right hip pain   UTI (urinary tract infection)   Fall -No loss of consciousness reported, seems have had a mechanic fall.  -Now has severe pain over right hip and left mid back. No acute bony fracture by images.  -will place on med-surg bed for obs -Pain is controlled with IV morphine and oral Percocet, on discharge oral Percocet continued. -When necessary Zofran for nausea. -PT/OT recommended skilled nursing facility.  Constipation -Developed abdominal pain/distention, abdominal x-ray showed large stools burden. -Given soapsuds enema with good results, started on MiraLAX and Senokot, follow BMs. Hold Senokot if she develops diarrhea.  UTI:  -Patient has dysuria and positive urinalysis, consistent with UTI. -Treated with Rocephin for 3 days, for some reason culture was not back at the time of discharge.  COPD with chronic respiratory failure with hypoxia -Stable with no decompensation, on 4 L of oxygen at home. -When necessary Mucinex for cough -Dulera inhaler -When necessary albuterol nebulizers -Scheduled DuoNeb nebulizers  HTN: Blood pressure 127/54 -Continue Zocor,   Local recurrence of left lung cancer [facility]): Initially diagnosed as stage IB non-small cell lung cancer, squamous cell carcinoma, s/p of curative stereotactic body radiotherapy and s/p of palliative radiotherapy to the subpleural left lower lobe lung mass. Patient is followed up by Dr. Julien Nordmann. Currently is and observation. -Follow-up with Dr. Julien Nordmann  Protein-calorie malnutrition, severe [facility]): -Ensure  Tobacco abuse -Was on nicotine patch and counseled extensively.  Functional status -Patient is very fragile, is on  oxygen and uses walker and wheelchair for ambulation. -She is getting weaker and now with frequent falls. -She takes care of her legally blind husband at home, daughter lives around. -PT/OT  recommended skilled nursing facility.  Discharge Instructions  Discharge Instructions    Diet - low sodium heart healthy    Complete by:  As directed      Increase activity slowly    Complete by:  As directed             Medication List    STOP taking these medications        bisacodyl 5 MG EC tablet  Generic drug:  bisacodyl     methylPREDNISolone 4 MG Tbpk tablet  Commonly known as:  MEDROL DOSEPAK     OVER THE COUNTER MEDICATION     sodium polystyrene powder  Commonly known as:  KAYEXALATE      TAKE these medications        ADVAIR DISKUS 250-50 MCG/DOSE Aepb  Generic drug:  Fluticasone-Salmeterol  Inhale 1 puff into the lungs daily.     albuterol (2.5 MG/3ML) 0.083% nebulizer solution  Commonly known as:  PROVENTIL  Take 3 mLs (2.5 mg total) by nebulization every 4 (four) hours as needed for wheezing or shortness of breath.     PROAIR HFA 108 (90 Base) MCG/ACT inhaler  Generic drug:  albuterol  Inhale 2 puffs into the lungs 4 (four) times daily as needed for shortness of breath.     calcium-vitamin D 500-200 MG-UNIT tablet  Commonly known as:  OSCAL WITH D  Take 1 tablet by mouth daily with breakfast.     ENSURE  Take 237 mLs by mouth 2 (two) times daily between meals.     glycerin adult 2 g Supp  Place 1 suppository rectally once as needed for moderate constipation.     ipratropium-albuterol 0.5-2.5 (3) MG/3ML Soln  Commonly known as:  DUONEB  Inhale 3 mLs into the lungs every 6 (six) hours as needed (shortness of breath).     losartan 25 MG tablet  Commonly known as:  COZAAR  Take 25 mg by mouth daily.     multivitamin with minerals Tabs tablet  Take 1 tablet by mouth daily.     ondansetron 4 MG tablet  Commonly known as:  ZOFRAN  Take 4 mg by mouth every 6 (six) hours as needed for nausea.     oxyCODONE-acetaminophen 5-325 MG tablet  Commonly known as:  PERCOCET/ROXICET  Take 1-2 tablets by mouth every 4 (four) hours as needed for severe  pain.     polyethylene glycol packet  Commonly known as:  MIRALAX  Take 17 g by mouth daily.     senna 8.6 MG tablet  Commonly known as:  SENOKOT  Take 1 tablet (8.6 mg total) by mouth daily.           Follow-up Information    Follow up with [facility].   Specialty:  Emergency Medicine   Why:  If symptoms worsen   Contact information:   [place] [id number] [place] [phone]      Follow up with [name], MD.   Specialty:  Internal Medicine   Contact information:   [address] [place] [address] [phone]      Allergies  Allergen Reactions  . Iohexol Rash    Broke out in a rash after an iv med at [facility] center.  Consultations:  None   Procedures/Studies: Dg Knee 2 Views Right  12/20/2015  CLINICAL DATA:  Right knee pain since fall yesterday. Unable to bear weight since the fall. EXAM: RIGHT KNEE - 1-2 VIEW COMPARISON:  Radiographs 03/07/2014 FINDINGS: No acute fracture or subluxation. Alignment joint spaces are maintained. No knee joint effusion. The bones are under mineralized. Remote midshaft tibia and fibular fractures are partially included. IMPRESSION: No acute fracture or subluxation of the right knee. Electronically Signed   By: Jeb Levering M.D.   On: 12/20/2015 21:06   Ct Thoracic Spine Wo Contrast  12/20/2015  CLINICAL DATA:  Mechanical fall in kitchen at home yesterday. RIGHT hip pain. EXAM: CT THORACIC AND LUMBAR SPINE WITHOUT CONTRAST TECHNIQUE: Multidetector CT imaging of the thoracic and lumbar spine was performed without contrast. Multiplanar CT image reconstructions were also generated. COMPARISON:  MRI of the lumbar spine February 27th 2017 and CT chest August 01, 2015 FINDINGS: CT THORACIC SPINE FINDINGS ALIGNMENT: Thoracic vertebral bodies are in alignment. Maintained thoracic lordosis. OSSEOUS STRUCTURES: Mild wedging vertebral body T1  with less than 25% height loss, endplate sclerosis. Moderate to severe chronic T12 compression fracture, similar in appearance to prior MRI. Osteopenia. Intervertebral disc heights generally preserved. Healing tenth through twelfth lateral rib fractures. Dense rounded consolidation measuring at least 3.9 cm LEFT lower lobe with increasing small to moderate pleural effusion. Severe centrilobular emphysema. SOFT TISSUES: Included prevertebral and paraspinal soft tissues are normal. CT LUMBAR SPINE FINDINGS SEGMENTATION: For the purposes of this report the last well-formed intervertebral disc space will be described as L5-S1. ALIGNMENT: Lumbar vertebral bodies in alignment, maintenance of the lumbar lordosis. OSSEOUS STRUCTURES: Lumbar vertebral bodies and posterior elements are intact. Mild L2-3 disc height loss compatible with early degenerative disc. No destructive bony lesions. Osteopenia. Moderate lower lumbar facet arthropathy. No osseous canal stenosis. Mild LEFT foraminal narrowing. Symmetric linear densities in the sacrum. SOFT TISSUES: Included prevertebral and paraspinal soft tissues are nonsuspicious. Partially imaged cyst in RIGHT renal fossa, RIGHT kidney not visualized on the CT. IMPRESSION: CT THORACIC SPINE IMPRESSION No acute thoracic fracture or malalignment. Mild T1 and moderate to severe T12 old compression fractures. Masslike consolidation LEFT lower lobe with increasing small to moderate pleural effusion concerning for neoplasm. CT LUMBAR SPINE IMPRESSION No acute lumbar fracture or malalignment. Symmetric linear densities in the sacrum, equivocal for stress fractures. Recommend correlation with point tenderness. Electronically Signed   By: Elon Alas M.D.   On: 12/20/2015 19:19   Ct Lumbar Spine Wo Contrast  12/20/2015  CLINICAL DATA:  Mechanical fall in kitchen at home yesterday. RIGHT hip pain. EXAM: CT THORACIC AND LUMBAR SPINE WITHOUT CONTRAST TECHNIQUE: Multidetector CT imaging of  the thoracic and lumbar spine was performed without contrast. Multiplanar CT image reconstructions were also generated. COMPARISON:  MRI of the lumbar spine February 27th 2017 and CT chest August 01, 2015 FINDINGS: CT THORACIC SPINE FINDINGS ALIGNMENT: Thoracic vertebral bodies are in alignment. Maintained thoracic lordosis. OSSEOUS STRUCTURES: Mild wedging vertebral body T1 with less than 25% height loss, endplate sclerosis. Moderate to severe chronic T12 compression fracture, similar in appearance to prior MRI. Osteopenia. Intervertebral disc heights generally preserved. Healing tenth through twelfth lateral rib fractures. Dense rounded consolidation measuring at least 3.9 cm LEFT lower lobe with increasing small to moderate pleural effusion. Severe centrilobular emphysema. SOFT TISSUES: Included prevertebral and paraspinal soft tissues are normal. CT LUMBAR SPINE FINDINGS SEGMENTATION: For the purposes of this report the last well-formed intervertebral disc space will be  described as L5-S1. ALIGNMENT: Lumbar vertebral bodies in alignment, maintenance of the lumbar lordosis. OSSEOUS STRUCTURES: Lumbar vertebral bodies and posterior elements are intact. Mild L2-3 disc height loss compatible with early degenerative disc. No destructive bony lesions. Osteopenia. Moderate lower lumbar facet arthropathy. No osseous canal stenosis. Mild LEFT foraminal narrowing. Symmetric linear densities in the sacrum. SOFT TISSUES: Included prevertebral and paraspinal soft tissues are nonsuspicious. Partially imaged cyst in RIGHT renal fossa, RIGHT kidney not visualized on the CT. IMPRESSION: CT THORACIC SPINE IMPRESSION No acute thoracic fracture or malalignment. Mild T1 and moderate to severe T12 old compression fractures. Masslike consolidation LEFT lower lobe with increasing small to moderate pleural effusion concerning for neoplasm. CT LUMBAR SPINE IMPRESSION No acute lumbar fracture or malalignment. Symmetric linear densities  in the sacrum, equivocal for stress fractures. Recommend correlation with point tenderness. Electronically Signed   By: Elon Alas M.D.   On: 12/20/2015 19:19   Dg Abd 2 Views  12/22/2015  CLINICAL DATA:  Pt c/o abdominal distention and generalized pain today EXAM: ABDOMEN - 2 VIEW COMPARISON:  09/30/2007 FINDINGS: Bowel gas pattern is nonobstructed. There is a large amount of stool throughout nondilated loops of colon. There is paucity of bowel gas in the right hemipelvis, raising question of a mass displacing bowel loops towards the left side. IMPRESSION: 1. Large stool burden. 2. No free air. 3. Question of right pelvic mass. Further evaluation is CT of the abdomen and pelvis is recommended. Electronically Signed   By: Nolon Nations M.D.   On: 12/22/2015 15:37   Dg Hip Unilat With Pelvis 2-3 Views Right  12/20/2015  CLINICAL DATA:  Pain following fall 1 day prior EXAM: DG HIP (WITH OR WITHOUT PELVIS) 2-3V RIGHT COMPARISON:  March 07, 2014 FINDINGS: Frontal pelvis as well as frontal and lateral right hip images were obtained. There is a total hip prosthesis on the right which appears well seated. There are foci of myositis ossificans superior and inferior to the right hip joint. There old healed fractures of the right ischium and right superior pubic ramus. There is no evident acute fracture or dislocation. There is slight narrowing of the left hip joint. No erosive changes. IMPRESSION: Total hip prosthesis on the right with prosthetic components well-seated. Old trauma involving the right superior pubic ramus and ischium. No acute fracture or dislocation evident. Myositis ossifications noted in the right hip joint region superiorly and inferiorly. Electronically Signed   By: Lowella Grip III M.D.   On: 12/20/2015 18:32    (Echo, Carotid, EGD, Colonoscopy, ERCP)    Subjective:   Discharge Exam: Filed Vitals:   12/22/15 2135 12/23/15 0530  BP: 142/48 146/59  Pulse: 110 102  Temp:  98.3 F (36.8 C) 98.5 F (36.9 C)  Resp: 18 18   Filed Vitals:   12/22/15 2006 12/22/15 2135 12/23/15 0530 12/23/15 0754  BP:  142/48 146/59   Pulse:  110 102   Temp:  98.3 F (36.8 C) 98.5 F (36.9 C)   TempSrc:  Oral Oral   Resp:  18 18   Height:      Weight:      SpO2: 98% 98% 96% 93%    General: Pt is alert, awake, not in acute distress Cardiovascular: RRR, S1/S2 +, no rubs, no gallops Respiratory: CTA bilaterally, no wheezing, no rhonchi Abdominal: Soft, NT, ND, bowel sounds + Extremities: no edema, no cyanosis    The results of significant diagnostics from this hospitalization (including imaging, microbiology, ancillary and  laboratory) are listed below for reference.     Microbiology: Recent Results (from the past 240 hour(s))  Culture, blood (Routine X 2) w Reflex to ID Panel     Status: None (Preliminary result)   Collection Time: 12/21/15  1:33 AM  Result Value Ref Range Status   Specimen Description BLOOD LEFT ANTECUBITAL  Final   Special Requests IN PEDIATRIC BOTTLE 2ML  Final   Culture   Final    NO GROWTH 1 DAY Performed at [facility]    Report Status PENDING  Incomplete  Culture, blood (Routine X 2) w Reflex to ID Panel     Status: None (Preliminary result)   Collection Time: 12/21/15  1:33 AM  Result Value Ref Range Status   Specimen Description BLOOD RIGHT ANTECUBITAL  Final   Special Requests IN PEDIATRIC BOTTLE 1.5CC  Final   Culture   Final    NO GROWTH 1 DAY Performed at [facility]    Report Status PENDING  Incomplete     Labs: BNP (last 3 results) No results for input(s): BNP in the last 8760 hours. Basic Metabolic Panel:  Recent Labs Lab 12/20/15 2351 12/21/15 0140  NA 135 134*  K 4.2 4.1  CL 92* 92*  CO2 38* 37*  GLUCOSE 167* 103*  BUN 15 17  CREATININE 0.43* 0.42*  CALCIUM 9.3 9.1   Liver Function Tests: No results for input(s): AST, ALT, ALKPHOS, BILITOT, PROT, ALBUMIN in the last 168 hours. No  results for input(s): LIPASE, AMYLASE in the last 168 hours. No results for input(s): AMMONIA in the last 168 hours. CBC:  Recent Labs Lab 12/20/15 2216 12/21/15 0140  WBC 15.2* 13.1*  HGB 9.6* 8.2*  HCT 31.6* 25.6*  MCV 107.1* 105.3*  PLT 495* 445*   Cardiac Enzymes: No results for input(s): CKTOTAL, CKMB, CKMBINDEX, TROPONINI in the last 168 hours. BNP: Invalid input(s): POCBNP CBG: No results for input(s): GLUCAP in the last 168 hours. D-Dimer No results for input(s): DDIMER in the last 72 hours. Hgb A1c No results for input(s): HGBA1C in the last 72 hours. Lipid Profile No results for input(s): CHOL, HDL, LDLCALC, TRIG, CHOLHDL, LDLDIRECT in the last 72 hours. Thyroid function studies No results for input(s): TSH, T4TOTAL, T3FREE, THYROIDAB in the last 72 hours.  Invalid input(s): FREET3 Anemia work up No results for input(s): VITAMINB12, FOLATE, FERRITIN, TIBC, IRON, RETICCTPCT in the last 72 hours. Urinalysis    Component Value Date/Time   COLORURINE YELLOW 12/20/2015 2205   APPEARANCEUR CLOUDY* 12/20/2015 2205   LABSPEC 1.019 12/20/2015 2205   PHURINE 5.5 12/20/2015 2205   GLUCOSEU NEGATIVE 12/20/2015 2205   HGBUR SMALL* 12/20/2015 2205   BILIRUBINUR NEGATIVE 12/20/2015 2205   KETONESUR 40* 12/20/2015 2205   PROTEINUR NEGATIVE 12/20/2015 2205   UROBILINOGEN 0.2 03/07/2014 1122   NITRITE POSITIVE* 12/20/2015 2205   LEUKOCYTESUR MODERATE* 12/20/2015 2205   Sepsis Labs Invalid input(s): PROCALCITONIN,  WBC,  LACTICIDVEN Microbiology Recent Results (from the past 240 hour(s))  Culture, blood (Routine X 2) w Reflex to ID Panel     Status: None (Preliminary result)   Collection Time: 12/21/15  1:33 AM  Result Value Ref Range Status   Specimen Description BLOOD LEFT ANTECUBITAL  Final   Special Requests IN PEDIATRIC BOTTLE 2ML  Final   Culture   Final    NO GROWTH 1 DAY Performed at [facility]    Report Status PENDING  Incomplete  Culture, blood  (Routine X 2)  w Reflex to ID Panel     Status: None (Preliminary result)   Collection Time: 12/21/15  1:33 AM  Result Value Ref Range Status   Specimen Description BLOOD RIGHT ANTECUBITAL  Final   Special Requests IN PEDIATRIC BOTTLE 1.5CC  Final   Culture   Final    NO GROWTH 1 DAY Performed at [facility]    Report Status PENDING  Incomplete     Time coordinating discharge: Over 30 minutes  SIGNED:   Birdie Hopes, MD  [facility] 12/23/2015, 8:52 AM Pager   If 7PM-7AM, please contact night-coverage [email or web address] Password [email or web address]

## 2015-12-23 NOTE — Progress Notes (Signed)
Physical Therapy Treatment Patient Details Name: [name] MRN: [id number] DOB: 10/17/1934 Today's Date: 12/23/2015    History of Present Illness 81 y.o. female with h/o R hip hemiarthroplasty, stage IV lung cancer on 4 L O2 at home admitted with a fall, back & R groin pain, UTI. Imaging negative for fractures.     PT Comments    Assisted pt OOB to [facility] to void.  Assisted with hygiene then + 2 assist to amb a limited distance.  Follow Up Recommendations  SNF     Equipment Recommendations       Recommendations for Other Services       Precautions / Restrictions Precautions Precautions: Fall Precaution Comments: home O2 4 lts Restrictions Weight Bearing Restrictions: No    Mobility  Bed Mobility Overal bed mobility: Needs Assistance Bed Mobility: Supine to Sit     Supine to sit: Mod assist     General bed mobility comments: light assistance for legs and mod A for trunk  Transfers Overall transfer level: Needs assistance Equipment used: Rolling walker (2 wheeled) Transfers: Sit to/from Stand Sit to Stand: Mod assist;+2 safety/equipment Stand pivot transfers: Mod assist;+2 safety/equipment       General transfer comment: + 2 assist from bed to [facility] then sit to stand with 50% VC's on proper hand placement   Ambulation/Gait Ambulation/Gait assistance: Max assist;+2 physical assistance;+2 safety/equipment Ambulation Distance (Feet): 5 Feet Assistive device: Rolling walker (2 wheeled) Gait Pattern/deviations: Step-to pattern;Decreased step length - right;Decreased step length - left;Trunk flexed;Narrow base of support Gait velocity: decreased   General Gait Details: very limited amb distance due to weakness, anxiety and dyspnea.  Remained on 4 lts nasal.  avg 91%.     Stairs            Wheelchair Mobility    Modified Rankin (Stroke Patients Only)       Balance                                    Cognition Arousal/Alertness:  Awake/alert Behavior During Therapy: WFL for tasks assessed/performed Overall Cognitive Status: Within Functional Limits for tasks assessed                      Exercises      General Comments        Pertinent Vitals/Pain Faces Pain Scale: Hurts little more Pain Location: back Pain Descriptors / Indicators: Grimacing Pain Intervention(s): Monitored during session;Repositioned    Home Living                      Prior Function            PT Goals (current goals can now be found in the care plan section) Progress towards PT goals: Progressing toward goals    Frequency  Min 3X/week    PT Plan Current plan remains appropriate    Co-evaluation             End of Session Equipment Utilized During Treatment: Gait belt;Oxygen Activity Tolerance: Patient limited by fatigue Patient left: in chair;with call bell/phone within reach;with chair alarm set     Time: 9379-0240 PT Time Calculation (min) (ACUTE ONLY): 30 min  Charges:  $Gait Training: 8-22 mins $Therapeutic Activity: 8-22 mins                    G Codes:  Rica Koyanagi  PTA [facility]  Acute  Rehab Pager      [phone]

## 2015-12-23 NOTE — Clinical Social Work Placement (Signed)
Patient is set to discharge to [facility] today. Patient & daughter, [name] aware. Discharge packet given to RN, Ripley. [facility] called for transport.     Raynaldo Opitz, [facility] Clinical Social Worker cell #: [phone]    CLINICAL SOCIAL WORK PLACEMENT  NOTE  Date:  12/23/2015  Patient Details  Name: [name] MRN: [id number] Date of Birth: 05-Dec-1934  Clinical Social Work is seeking post-discharge placement for this patient at the [place] level of care (*CSW will initial, date and re-position this form in  chart as items are completed):  Yes   Patient/family provided with [place] Work Department's list of facilities offering this level of care within the geographic area requested by the patient (or if unable, by the patient's family).  Yes   Patient/family informed of their freedom to choose among providers that offer the needed level of care, that participate in Medicare, Medicaid or managed care program needed by the patient, have an available bed and are willing to accept the patient.  Yes   Patient/family informed of [place]'s ownership interest in [facility] and [facility], as well as of the fact that they are under no obligation to receive care at these facilities.  PASRR submitted to EDS on       PASRR number received on       Existing PASRR number confirmed on 12/21/15     FL2 transmitted to all facilities in geographic area requested by pt/family on 12/21/15     FL2 transmitted to all facilities within larger geographic area on       Patient informed that his/her managed care company has contracts with or will negotiate with certain facilities, including the following:        Yes   Patient/family informed of bed offers received.  Patient chooses bed at [facility]     Physician recommends and patient chooses bed at      Patient to be transferred to [facility] on 12/23/15.  Patient to be transferred to facility by [facility]     Patient family notified on 12/23/15 of transfer.  Name of family member notified:  patient's daughter, [name] via phone     PHYSICIAN       Additional Comment:    _______________________________________________ Standley Brooking, LCSW 12/23/2015, 9:38 AM

## 2015-12-23 NOTE — Progress Notes (Signed)
Soap suds given to patient per MD order with results

## 2015-12-23 NOTE — Progress Notes (Signed)
Nutrition Brief Note  Patient identified as underweight BMI.  Wt Readings from Last 15 Encounters:  12/20/15 88 lb (39.917 kg)  09/19/15 87 lb (39.463 kg)  03/01/15 84 lb (38.102 kg)  10/25/14 85 lb 8 oz (38.783 kg)  07/26/14 88 lb 8 oz (40.143 kg)  07/09/14 90 lb 3.2 oz (40.914 kg)  04/20/14 93 lb 6.4 oz (42.366 kg)  04/01/14 96 lb (43.545 kg)  03/16/14 108 lb 0.4 oz (49 kg)  03/08/14 98 lb (44.453 kg)  02/11/14 96 lb 4.8 oz (43.681 kg)  02/11/14 96 lb 4.8 oz (43.681 kg)  02/05/14 97 lb 9.6 oz (44.271 kg)  01/29/14 97 lb 12.8 oz (44.362 kg)  01/22/14 98 lb 6.4 oz (44.634 kg)    Body mass index is 15.1 kg/(m^2). Patient meets criteria for underweight based on current BMI.   Current diet order is Heart Healthy, patient is consuming approximately 50-100% of meals at this time. Labs and medications reviewed. Order and summary in this AM for pt to d/c to SNF. MD note states severe malnutrition present and order for Ensure Enlive BID currently in place with this order being continued following d/c per summary review.   No nutrition interventions warranted at this time. If nutrition issues arise, please consult RD.    Jarome Matin, MS, RD, LDN Inpatient Clinical Dietitian Pager # [phone] After hours/weekend pager # [phone]

## 2015-12-23 NOTE — Progress Notes (Addendum)
Report called to [employer], Blades LPN.

## 2015-12-26 LAB — CULTURE, BLOOD (ROUTINE X 2)
CULTURE: NO GROWTH
Culture: NO GROWTH

## 2016-01-03 DIAGNOSIS — D0220 Carcinoma in situ of unspecified bronchus and lung: Secondary | ICD-10-CM | POA: Diagnosis not present

## 2016-01-03 DIAGNOSIS — R110 Nausea: Secondary | ICD-10-CM | POA: Diagnosis not present

## 2016-01-03 DIAGNOSIS — Z515 Encounter for palliative care: Secondary | ICD-10-CM | POA: Diagnosis not present

## 2016-01-03 DIAGNOSIS — M25561 Pain in right knee: Secondary | ICD-10-CM | POA: Diagnosis not present

## 2016-01-03 DIAGNOSIS — M6281 Muscle weakness (generalized): Secondary | ICD-10-CM | POA: Diagnosis not present

## 2016-01-03 DIAGNOSIS — R1084 Generalized abdominal pain: Secondary | ICD-10-CM | POA: Diagnosis not present

## 2016-01-03 DIAGNOSIS — J449 Chronic obstructive pulmonary disease, unspecified: Secondary | ICD-10-CM | POA: Diagnosis not present

## 2016-01-03 DIAGNOSIS — R5381 Other malaise: Secondary | ICD-10-CM | POA: Diagnosis not present

## 2016-01-10 DIAGNOSIS — R627 Adult failure to thrive: Secondary | ICD-10-CM | POA: Diagnosis not present

## 2016-01-11 DIAGNOSIS — D0220 Carcinoma in situ of unspecified bronchus and lung: Secondary | ICD-10-CM | POA: Diagnosis not present

## 2016-01-11 DIAGNOSIS — D649 Anemia, unspecified: Secondary | ICD-10-CM | POA: Diagnosis not present

## 2016-01-11 DIAGNOSIS — J449 Chronic obstructive pulmonary disease, unspecified: Secondary | ICD-10-CM | POA: Diagnosis not present

## 2016-01-12 ENCOUNTER — Telehealth: Payer: Self-pay | Admitting: Medical Oncology

## 2016-01-12 NOTE — Telephone Encounter (Signed)
Yvette notified that Julien Nordmann would like for Dr Maudie Mercury to be attending , but if will not then [facility] will be attending.

## 2016-02-03 DEATH — deceased

## 2016-03-12 ENCOUNTER — Other Ambulatory Visit: Payer: Medicare Other

## 2016-03-19 ENCOUNTER — Ambulatory Visit: Payer: Medicare Other | Admitting: Internal Medicine
